# Patient Record
Sex: Female | Born: 1955 | Race: White | Hispanic: No | Marital: Married | Smoking: Former smoker
Health system: Southern US, Community
[De-identification: ages and names within clinical notes are randomized; demographics above are authoritative.]

## PROBLEM LIST (undated history)

## (undated) DIAGNOSIS — G629 Polyneuropathy, unspecified: Secondary | ICD-10-CM

## (undated) DIAGNOSIS — E119 Type 2 diabetes mellitus without complications: Secondary | ICD-10-CM

## (undated) DIAGNOSIS — N189 Chronic kidney disease, unspecified: Secondary | ICD-10-CM

## (undated) DIAGNOSIS — G2581 Restless legs syndrome: Secondary | ICD-10-CM

## (undated) DIAGNOSIS — R0600 Dyspnea, unspecified: Secondary | ICD-10-CM

## (undated) DIAGNOSIS — F419 Anxiety disorder, unspecified: Secondary | ICD-10-CM

## (undated) DIAGNOSIS — G4730 Sleep apnea, unspecified: Secondary | ICD-10-CM

## (undated) DIAGNOSIS — I639 Cerebral infarction, unspecified: Secondary | ICD-10-CM

## (undated) DIAGNOSIS — K219 Gastro-esophageal reflux disease without esophagitis: Secondary | ICD-10-CM

## (undated) DIAGNOSIS — I493 Ventricular premature depolarization: Secondary | ICD-10-CM

## (undated) DIAGNOSIS — Z8489 Family history of other specified conditions: Secondary | ICD-10-CM

## (undated) DIAGNOSIS — C3412 Malignant neoplasm of upper lobe, left bronchus or lung: Secondary | ICD-10-CM

## (undated) DIAGNOSIS — Z8719 Personal history of other diseases of the digestive system: Secondary | ICD-10-CM

## (undated) DIAGNOSIS — I10 Essential (primary) hypertension: Secondary | ICD-10-CM

## (undated) DIAGNOSIS — M1990 Unspecified osteoarthritis, unspecified site: Secondary | ICD-10-CM

## (undated) DIAGNOSIS — M810 Age-related osteoporosis without current pathological fracture: Secondary | ICD-10-CM

## (undated) DIAGNOSIS — D649 Anemia, unspecified: Secondary | ICD-10-CM

## (undated) DIAGNOSIS — F32A Depression, unspecified: Secondary | ICD-10-CM

## (undated) DIAGNOSIS — F329 Major depressive disorder, single episode, unspecified: Secondary | ICD-10-CM

## (undated) DIAGNOSIS — E039 Hypothyroidism, unspecified: Secondary | ICD-10-CM

## (undated) DIAGNOSIS — Z87448 Personal history of other diseases of urinary system: Secondary | ICD-10-CM

## (undated) HISTORY — PX: GALLBLADDER SURGERY: SHX652

## (undated) HISTORY — PX: BUNIONECTOMY: SHX129

## (undated) HISTORY — PX: TONSILLECTOMY: SUR1361

## (undated) HISTORY — DX: Age-related osteoporosis without current pathological fracture: M81.0

## (undated) HISTORY — DX: Type 2 diabetes mellitus without complications: E11.9

## (undated) HISTORY — PX: OTHER SURGICAL HISTORY: SHX169

## (undated) HISTORY — DX: Gastro-esophageal reflux disease without esophagitis: K21.9

## (undated) HISTORY — PX: CHOLECYSTECTOMY: SHX55

## (undated) HISTORY — DX: Personal history of other diseases of urinary system: Z87.448

## (undated) HISTORY — DX: Depression, unspecified: F32.A

## (undated) HISTORY — DX: Sleep apnea, unspecified: G47.30

## (undated) HISTORY — DX: Unspecified osteoarthritis, unspecified site: M19.90

## (undated) HISTORY — DX: Major depressive disorder, single episode, unspecified: F32.9

## (undated) HISTORY — PX: NEUROMA SURGERY: SHX722

---

## 2001-10-26 HISTORY — PX: SPINAL FUSION: SHX223

## 2005-10-26 DIAGNOSIS — I639 Cerebral infarction, unspecified: Secondary | ICD-10-CM

## 2005-10-26 HISTORY — DX: Cerebral infarction, unspecified: I63.9

## 2007-08-16 DIAGNOSIS — K219 Gastro-esophageal reflux disease without esophagitis: Secondary | ICD-10-CM | POA: Insufficient documentation

## 2016-03-27 DIAGNOSIS — E119 Type 2 diabetes mellitus without complications: Secondary | ICD-10-CM | POA: Insufficient documentation

## 2016-12-18 ENCOUNTER — Ambulatory Visit (INDEPENDENT_AMBULATORY_CARE_PROVIDER_SITE_OTHER): Payer: Self-pay | Admitting: Orthopaedic Surgery

## 2016-12-24 ENCOUNTER — Ambulatory Visit (INDEPENDENT_AMBULATORY_CARE_PROVIDER_SITE_OTHER): Payer: BLUE CROSS/BLUE SHIELD | Admitting: Orthopaedic Surgery

## 2017-01-07 ENCOUNTER — Ambulatory Visit (INDEPENDENT_AMBULATORY_CARE_PROVIDER_SITE_OTHER): Payer: Self-pay

## 2017-01-07 ENCOUNTER — Ambulatory Visit (INDEPENDENT_AMBULATORY_CARE_PROVIDER_SITE_OTHER): Payer: BLUE CROSS/BLUE SHIELD | Admitting: Orthopaedic Surgery

## 2017-01-07 ENCOUNTER — Encounter (INDEPENDENT_AMBULATORY_CARE_PROVIDER_SITE_OTHER): Payer: Self-pay | Admitting: Orthopaedic Surgery

## 2017-01-07 VITALS — BP 119/69 | HR 70 | Temp 98.6°F | Ht 62.0 in | Wt 206.0 lb

## 2017-01-07 DIAGNOSIS — M545 Low back pain: Secondary | ICD-10-CM

## 2017-01-07 DIAGNOSIS — G8929 Other chronic pain: Secondary | ICD-10-CM

## 2017-01-07 DIAGNOSIS — M542 Cervicalgia: Secondary | ICD-10-CM

## 2017-01-07 NOTE — Progress Notes (Signed)
Office Visit Note   Patient: Tonya Shelton           Date of Birth: 10-Sep-1956           MRN: [id number] Visit Date: 01/07/2017              Requested by: [name], [place] [address] PCP: [name], FNP   Assessment & Plan: Visit Diagnoses:  1. Neck pain   2. Chronic bilateral low back pain, with sciatica presence unspecified     Plan: Patient has neurogenic claudication symptoms with intact pulses the lower extremity. No evidence of radiculopathy on exam. Previous cervical fusion with degenerative changes lumbar spine consistent with spondylosis. We will proceed with the lumbar MRI since she failed conservative treatment. Thank for the opportunity to share in her care.  Follow-Up Instructions: No Follow-up on file.   Orders:  Orders Placed This Encounter  Procedures  . XR Cervical Spine 2 or 3 views  . XR Lumbar Spine 2-3 Views  . MR Lumbar Spine w/o contrast   No orders of the defined types were placed in this encounter.     Procedures: No procedures performed   Clinical Data: No additional findings.   Subjective: Chief Complaint  Patient presents with  . Neck - Pain  . Middle Back - Pain  . Lower Back - Pain    Patient presents with back pain from her neck down to her low back. She has a history of DDD and bulging discs. She brought her records with her. She had a cervical fusion in 2003.  She takes tizanidine at night, gabapentin '100mg'$  BID, and rarely takes hydrocodone but has a script for it. She has tried heat, ice makes it worse, and rest makes it better.   Patient states she cannot ambulate 100 feet without having to stop and sit down. She's been through multiple chiropractic visits more than 8. This has not improved her pain. She's had previous cervical fusion 2003. She's been on anti-inflammatories, gabapentin, heat. She gets relief with sitting position. Increased pain after standing for more than 5  minutes. She is followed in [place] by Rueben Bash PA. She has problems when she goes in a store since she cannot ambulate very far. Review of Systems 14 point review of systems positive for history of acid reflux arthritis in her knees primarily by her problems depression diabetes appears gallbladder removal hypertension osteoporosis on Fosamax, sleep apnea and thyroid condition. Patient is not working currently. She smoked for 40 years 1 pack per day but doesn't smoke now. She does not drink.  Objective: Vital Signs: BP 119/69   Pulse 70   Temp 98.6 F (37 C)   Ht '5\' 2"'$  (1.575 m)   Wt 206 lb (93.4 kg)   BMI 37.68 kg/m   Physical Exam  Constitutional: She is oriented to person, place, and time. She appears well-developed.  HENT:  Head: Normocephalic.  Right Ear: External ear normal.  Left Ear: External ear normal.  Eyes: Pupils are equal, round, and reactive to light.  Patient was glasses.  Neck: No tracheal deviation present. No thyromegaly present.  Cardiovascular: Normal rate.   Pulmonary/Chest: Effort normal.  Abdominal: Soft.  Musculoskeletal:  Patient gets worsening standing she can ambulate anterior tib EHL is strong reflexes are 2+ and symmetrical. No pain with hip range of motion. She has crepitus with knee extension and patellofemoral loading. Distal pulses are intact. Sensation her foot is intact  no plantar foot lesions.  Neurological: She is alert and oriented to person, place, and time.  Skin: Skin is warm and dry.  Psychiatric: She has a normal mood and affect. Her behavior is normal.    Ortho Exam  Specialty Comments:  No specialty comments available.  Imaging: No results found.   PMFS History: There are no active problems to display for this patient.  Past Medical History:  Diagnosis Date  . Acid reflux   . Arthritis   . Depression   . Diabetes mellitus without complication ([place])   . H/O bladder problems   . Osteoporosis   . Sleep apnea      No family history on file.  Past Surgical History:  Procedure Laterality Date  . BUNIONECTOMY    . GALLBLADDER SURGERY    . NEUROMA SURGERY    . SPINAL FUSION  2003   cervical  . tonsillectomy     Social History   Occupational History  . Not on file.   Social History Main Topics  . Smoking status: Former Research scientist (life sciences)  . Smokeless tobacco: Never Used  . Alcohol use No  . Drug use: No  . Sexual activity: Not on file

## 2017-01-12 ENCOUNTER — Ambulatory Visit (INDEPENDENT_AMBULATORY_CARE_PROVIDER_SITE_OTHER): Payer: Self-pay | Admitting: Orthopaedic Surgery

## 2017-01-14 ENCOUNTER — Ambulatory Visit (INDEPENDENT_AMBULATORY_CARE_PROVIDER_SITE_OTHER): Payer: BLUE CROSS/BLUE SHIELD | Admitting: Orthopaedic Surgery

## 2017-01-14 ENCOUNTER — Encounter (INDEPENDENT_AMBULATORY_CARE_PROVIDER_SITE_OTHER): Payer: Self-pay | Admitting: Orthopaedic Surgery

## 2017-01-14 VITALS — BP 137/64 | HR 59 | Ht 62.0 in | Wt 206.0 lb

## 2017-01-14 DIAGNOSIS — M5116 Intervertebral disc disorders with radiculopathy, lumbar region: Secondary | ICD-10-CM | POA: Diagnosis not present

## 2017-01-14 NOTE — Progress Notes (Signed)
Office Visit Note   Patient: Tonya Shelton           Date of Birth: 31-Jul-1956           MRN: [id number] Visit Date: 01/14/2017              Requested by: [name], [place] [address] PCP: [name], FNP   Assessment & Plan: Visit Diagnoses:  1. Lumbar disc herniation with radiculopathy     Plan: shortly she is to be on [employer] she'd she now is on a new insurance. She hadn't had an injection since last year. I scan is reviewed with her I gave her a copy of the report. This shows disc herniation with compression at L5 S1 on the left with mass effect on the left S1 nerve root. Minimal bulges at other levels. She has good hydration at all disc levels all the way down to L3-4 disc. Minimal dehydration at L4-5 and desiccation at L5-S1 more pronounced. She's had use a cane to avoid falling. She still has some weakness in her legs when she gets up and walks she develops claudication symptoms that's a stop and sit. We'll see her back after the epidural injection. We reviewed the MRI scan and I gave her copy of the report.had a total over the last several years of 8-10 epidurals without sustained relief.  Follow-Up Instructions: Return in about 4 weeks (around 02/11/2017).   Orders:  No orders of the defined types were placed in this encounter.  No orders of the defined types were placed in this encounter.     Procedures: No procedures performed   Clinical Data: No additional findings.   Subjective: Chief Complaint  Patient presents with  . Lower Back - Pain    Patient returns to review MRI Lumbar Spine. She states that there have been no changes.     Review of SystemsUse systems updated from 01/07/2017 office visit 1 week ago. There is no change.    Objective: Vital Signs: BP 137/64   Pulse (!) 59   Ht '5\' 2"'$  (1.575 m)   Wt 206 lb (93.4 kg)   BMI 37.68 kg/m   Physical Exam  Constitutional: She is oriented to  person, place, and time. She appears well-developed.  HENT:  Head: Normocephalic.  Right Ear: External ear normal.  Left Ear: External ear normal.  Eyes: Pupils are equal, round, and reactive to light.  Neck: No tracheal deviation present. No thyromegaly present.  Cardiovascular: Normal rate.   Pulmonary/Chest: Effort normal.  Abdominal: Soft.  Neurological: She is alert and oriented to person, place, and time.  Skin: Skin is warm and dry.  Psychiatric: She has a normal mood and affect. Her behavior is normal.  Patient has a strong anterior tib EHL. Some patellofemoral crepitus with knee extension. Cessation her foot is intact no plantar foot lesions. No pitting edema. No pain with hip range of motion. Patient's alert and oriented.   Ortho Exam  Specialty Comments:  No specialty comments available.  Imaging: No results found.   PMFS History: There are no active problems to display for this patient.  Past Medical History:  Diagnosis Date  . Acid reflux   . Arthritis   . Depression   . Diabetes mellitus without complication ([place])   . H/O bladder problems   . Osteoporosis   . Sleep apnea     No family history on file.  Past Surgical History:  Procedure Laterality Date  . BUNIONECTOMY    . GALLBLADDER SURGERY    . NEUROMA SURGERY    . SPINAL FUSION  2003   cervical  . tonsillectomy     Social History   Occupational History  . Not on file.   Social History Main Topics  . Smoking status: Former Research scientist (life sciences)  . Smokeless tobacco: Never Used  . Alcohol use No  . Drug use: No  . Sexual activity: Not on file

## 2017-01-14 NOTE — Addendum Note (Signed)
Addended by: Meyer Cory on: 01/14/2017 04:34 PM   Modules accepted: Orders

## 2017-01-20 ENCOUNTER — Telehealth (INDEPENDENT_AMBULATORY_CARE_PROVIDER_SITE_OTHER): Payer: Self-pay | Admitting: Physical Medicine and Rehabilitation

## 2017-01-20 MED ORDER — DIAZEPAM 5 MG PO TABS: ORAL_TABLET | ORAL | 0 refills | Status: DC

## 2017-01-20 NOTE — Addendum Note (Signed)
Addended by: Meyer Cory on: 01/20/2017 04:43 PM   Modules accepted: Orders

## 2017-01-20 NOTE — Telephone Encounter (Signed)
Script entered into system. Patient is from [place], [place] and asked for it to be called in. Called to [facility] in [place].

## 2017-01-20 NOTE — Telephone Encounter (Signed)
Patient called wanting to know if she can still get something to relax her before she receives the injection on April 4th.  CB#[phone].  Thank you.

## 2017-01-20 NOTE — Telephone Encounter (Signed)
Please advise 

## 2017-01-20 NOTE — Telephone Encounter (Signed)
OK for rx for valium '5mg'$    # 2 .    Print and I can sign thanks.

## 2017-01-20 NOTE — Telephone Encounter (Signed)
This is [facility] patient

## 2017-01-27 ENCOUNTER — Encounter (INDEPENDENT_AMBULATORY_CARE_PROVIDER_SITE_OTHER): Payer: Self-pay | Admitting: Physical Medicine and Rehabilitation

## 2017-01-27 ENCOUNTER — Ambulatory Visit (INDEPENDENT_AMBULATORY_CARE_PROVIDER_SITE_OTHER): Payer: BLUE CROSS/BLUE SHIELD | Admitting: Physical Medicine and Rehabilitation

## 2017-01-27 ENCOUNTER — Ambulatory Visit (INDEPENDENT_AMBULATORY_CARE_PROVIDER_SITE_OTHER): Payer: BLUE CROSS/BLUE SHIELD

## 2017-01-27 VITALS — BP 145/67 | HR 75 | Temp 98.0°F

## 2017-01-27 DIAGNOSIS — M5416 Radiculopathy, lumbar region: Secondary | ICD-10-CM

## 2017-01-27 MED ORDER — METHYLPREDNISOLONE ACETATE 80 MG/ML IJ SUSP
80.0000 mg | Freq: Once | INTRAMUSCULAR | Status: AC
  Administered 2017-01-27: 80 mg

## 2017-01-27 MED ORDER — LIDOCAINE HCL (PF) 1 % IJ SOLN
0.3300 mL | Freq: Once | INTRAMUSCULAR | Status: AC
  Administered 2017-01-27: 0.3 mL

## 2017-01-27 NOTE — Patient Instructions (Signed)

## 2017-01-27 NOTE — Progress Notes (Signed)
Tonya Shelton - 61 y.o. female MRN [id number]  Date of birth: 1956-07-01  Office Visit Note: Visit Date: 01/27/2017 PCP: [name], FNP Referred by: [name], F*  Subjective: Chief Complaint  Patient presents with  . Lower Back - Pain   HPI: Tonya Shelton is a 62 year old female followed by Dr. Lorin Mercy who complains of chronic worsening severe pain in the center of her lower back and radiates to the left side and down leg to just past knee. Occasionally will radiate all the way to foot. Denies numbness and tingling. She has MRI evidence of paracentral disc herniation at L5-S1 on the left. We are going to complete a diagnostic and therapeutic left L5-S1 interlaminar epidural injection. Depending on the relief would suggest S1 transforaminal injection.    ROS Otherwise per HPI.  Assessment & Plan: Visit Diagnoses:  1. Lumbar radiculopathy     Plan: Findings:  Left L5-S1 interlaminar epidural steroid injection.    Meds & Orders:  Meds ordered this encounter  Medications  . lidocaine (PF) (XYLOCAINE) 1 % injection 0.3 mL  . methylPREDNISolone acetate (DEPO-MEDROL) injection 80 mg    Orders Placed This Encounter  Procedures  . XR C-ARM NO REPORT  . Epidural Steroid injection    Follow-up: Return for Schedule follow up with Dr. Lorin Mercy, 2 to 3 weeks.   Procedures: No procedures performed  Lumbar Epidural Steroid Injection - Interlaminar Approach with Fluoroscopic Guidance  Patient: Tonya Shelton      Date of Birth: [birth year] MRN: [id number] PCP: [name], FNP      Visit Date: 01/27/2017   Universal Protocol:    Date/Time: 04/06/185:43 AM  Consent Given By: the patient  Position: PRONE  Additional Comments: Vital signs were monitored before and after the procedure. Patient was prepped and draped in the usual sterile fashion. The correct patient, procedure, and site was verified.   Injection Procedure Details:  Procedure Site  One Meds Administered:  Meds ordered this encounter  Medications  . lidocaine (PF) (XYLOCAINE) 1 % injection 0.3 mL  . methylPREDNISolone acetate (DEPO-MEDROL) injection 80 mg     Laterality: Left  Location/Site:  L5-S1  Needle size: 20 G  Needle type: Tuohy  Needle Placement: Paramedian epidural  Findings:  -Contrast Used: 1 mL iohexol 180 mg iodine/mL   -Comments: Excellent flow of contrast into the epidural space.  Procedure Details: Using a paramedian approach from the side mentioned above, the region overlying the inferior lamina was localized under fluoroscopic visualization and the soft tissues overlying this structure were infiltrated with 4 ml. of 1% Lidocaine without Epinephrine. The Tuohy needle was inserted into the epidural space using a paramedian approach.   The epidural space was localized using loss of resistance along with lateral and bi-planar fluoroscopic views.  After negative aspirate for air, blood, and CSF, a 2 ml. volume of Isovue-250 was injected into the epidural space and the flow of contrast was observed. Radiographs were obtained for documentation purposes.    The injectate was administered into the level noted above.   Additional Comments:  The patient tolerated the procedure well Dressing: Band-Aid    Post-procedure details: Patient was observed during the procedure. Post-procedure instructions were reviewed.  Patient left the clinic in stable condition.    Clinical History: Lumbar spine MRI 12/2016  This shows disc herniation with compression at L5-S1 on the left with mass effect on the left S1 nerve root. Minimal bulges at other levels. She has good hydration  at all disc levels all the way down to L3-4 disc. Minimal dehydration at L4-5 and desiccation at L5-S1 more pronounced.  She reports that she has quit smoking. She has never used smokeless tobacco. No results for input(s): HGBA1C, LABURIC in the last 8760 hours.  Objective:  VS:   HT:    WT:   BMI:     BP:(!) 145/67  HR:75bpm  TEMP:98 F (36.7 C)( )  RESP:96 % Physical Exam  Musculoskeletal:  Patient relates without aid with good distal strength.    Ortho Exam Imaging: No results found.  Past Medical/Family/Surgical/Social History: Medications & Allergies reviewed per EMR There are no active problems to display for this patient.  Past Medical History:  Diagnosis Date  . Acid reflux   . Arthritis   . Depression   . Diabetes mellitus without complication ([place])   . H/O bladder problems   . Osteoporosis   . Sleep apnea    History reviewed. No pertinent family history. Past Surgical History:  Procedure Laterality Date  . BUNIONECTOMY    . GALLBLADDER SURGERY    . NEUROMA SURGERY    . SPINAL FUSION  2003   cervical  . tonsillectomy     Social History   Occupational History  . Not on file.   Social History Main Topics  . Smoking status: Former Research scientist (life sciences)  . Smokeless tobacco: Never Used  . Alcohol use No  . Drug use: No  . Sexual activity: Not on file

## 2017-01-29 NOTE — Procedures (Signed)
Lumbar Epidural Steroid Injection - Interlaminar Approach with Fluoroscopic Guidance  Patient: Tonya Shelton      Date of Birth: 04/05/1956 MRN: [id number] PCP: [name], FNP      Visit Date: 01/27/2017   Universal Protocol:    Date/Time: 04/06/185:43 AM  Consent Given By: the patient  Position: PRONE  Additional Comments: Vital signs were monitored before and after the procedure. Patient was prepped and draped in the usual sterile fashion. The correct patient, procedure, and site was verified.   Injection Procedure Details:  Procedure Site One Meds Administered:  Meds ordered this encounter  Medications  . lidocaine (PF) (XYLOCAINE) 1 % injection 0.3 mL  . methylPREDNISolone acetate (DEPO-MEDROL) injection 80 mg     Laterality: Left  Location/Site:  L5-S1  Needle size: 20 G  Needle type: Tuohy  Needle Placement: Paramedian epidural  Findings:  -Contrast Used: 1 mL iohexol 180 mg iodine/mL   -Comments: Excellent flow of contrast into the epidural space.  Procedure Details: Using a paramedian approach from the side mentioned above, the region overlying the inferior lamina was localized under fluoroscopic visualization and the soft tissues overlying this structure were infiltrated with 4 ml. of 1% Lidocaine without Epinephrine. The Tuohy needle was inserted into the epidural space using a paramedian approach.   The epidural space was localized using loss of resistance along with lateral and bi-planar fluoroscopic views.  After negative aspirate for air, blood, and CSF, a 2 ml. volume of Isovue-250 was injected into the epidural space and the flow of contrast was observed. Radiographs were obtained for documentation purposes.    The injectate was administered into the level noted above.   Additional Comments:  The patient tolerated the procedure well Dressing: Band-Aid    Post-procedure details: Patient was observed during the  procedure. Post-procedure instructions were reviewed.  Patient left the clinic in stable condition.

## 2017-02-11 ENCOUNTER — Ambulatory Visit (INDEPENDENT_AMBULATORY_CARE_PROVIDER_SITE_OTHER): Payer: BLUE CROSS/BLUE SHIELD | Admitting: Orthopaedic Surgery

## 2017-02-18 ENCOUNTER — Encounter (INDEPENDENT_AMBULATORY_CARE_PROVIDER_SITE_OTHER): Payer: Self-pay | Admitting: Orthopaedic Surgery

## 2017-02-18 ENCOUNTER — Ambulatory Visit (INDEPENDENT_AMBULATORY_CARE_PROVIDER_SITE_OTHER): Payer: BLUE CROSS/BLUE SHIELD | Admitting: Orthopaedic Surgery

## 2017-02-18 VITALS — BP 106/56 | HR 69 | Ht 62.0 in | Wt 198.0 lb

## 2017-02-18 DIAGNOSIS — M5126 Other intervertebral disc displacement, lumbar region: Secondary | ICD-10-CM | POA: Diagnosis not present

## 2017-02-18 NOTE — Progress Notes (Signed)
Office Visit Note   Patient: Tonya Shelton           Date of Birth: 11-15-1955           MRN: [id number] Visit Date: 02/18/2017              Requested by: [name], [place] [address] PCP: [name], FNP   Assessment & Plan: Visit Diagnoses:  1. Protrusion of lumbar intervertebral disc     Plan: Patient will continue to work on weight loss. We reviewed the MRI scan again. She will try to increase her ambulation distance. She does not have significant stenosis on her MRI scan and we will recheck her again in 2 months. If she develops increasing radicular symptoms she will call and let us know.  Follow-Up Instructions: Return in about 2 months (around 04/20/2017).   Orders:  No orders of the defined types were placed in this encounter.  No orders of the defined types were placed in this encounter.     Procedures: No procedures performed   Clinical Data: No additional findings.   Subjective: Chief Complaint  Patient presents with  . Lower Back - Pain    HPI she returned she still having back and left leg symptoms but states she is about 50% better after the injection. Recently was placed on some iron for anemia and also has been taking some Lasix. We reviewed the MRI scan again today which showed the disc protrusion on the left at L5-S1 with some lateral recess stenosis and foraminal bulge of the disc only in the early portion of the foramina. We discussed operative intervention but since she is gotten 50% pain relief we'll continue nonoperative treatment at this point. Recently Lasix and ferrous sulfate has been added to her medication list. She has been through anti-inflammatories, gabapentin, chiropractic treatment. Pain with ambulation 100 feet or standing more than 10 minutes. Symptoms have progressed over the last year. Review of Systems 14 point review of systems is updated and is unchanged from 01/07/2017 other than as  mentioned in history of present illness.   Objective: Vital Signs: BP (!) 106/56   Pulse 69   Ht '5\' 2"'$  (1.575 m)   Wt 198 lb (89.8 kg)   BMI 36.21 kg/m   Physical Exam  Constitutional: She is oriented to person, place, and time. She appears well-developed.  HENT:  Head: Normocephalic.  Right Ear: External ear normal.  Left Ear: External ear normal.  Eyes: Pupils are equal, round, and reactive to light.  Neck: No tracheal deviation present. No thyromegaly present.  Cardiovascular: Normal rate.   Pulmonary/Chest: Effort normal.  Abdominal: Soft.  Musculoskeletal:  Pelvis is level mild tenderness over the sciatic notch. Reflexes are symmetrical. Distal pulses are intact. Quadriceps hamstrings anterior tib EHL gastrocsoleus are strong. No dorsal foot numbness. Negative [place]oman.  Neurological: She is alert and oriented to person, place, and time.  Skin: Skin is warm and dry.  Psychiatric: She has a normal mood and affect. Her behavior is normal.    Ortho Exam  Specialty Comments:  No specialty comments available.  Imaging:  MRI 01/13/2017  PMFS History: There are no active problems to display for this patient.  Past Medical History:  Diagnosis Date  . Acid reflux   . Arthritis   . Depression   . Diabetes mellitus without complication ([place])   . H/O bladder problems   . Osteoporosis   . Sleep apnea  No family history on file.  Past Surgical History:  Procedure Laterality Date  . BUNIONECTOMY    . GALLBLADDER SURGERY    . NEUROMA SURGERY    . SPINAL FUSION  2003   cervical  . tonsillectomy     Social History   Occupational History  . Not on file.   Social History Main Topics  . Smoking status: Former Research scientist (life sciences)  . Smokeless tobacco: Never Used  . Alcohol use No  . Drug use: No  . Sexual activity: Not on file

## 2017-04-15 ENCOUNTER — Encounter (INDEPENDENT_AMBULATORY_CARE_PROVIDER_SITE_OTHER): Payer: Self-pay | Admitting: Orthopaedic Surgery

## 2017-04-15 ENCOUNTER — Ambulatory Visit (INDEPENDENT_AMBULATORY_CARE_PROVIDER_SITE_OTHER): Payer: BLUE CROSS/BLUE SHIELD | Admitting: Orthopaedic Surgery

## 2017-04-15 VITALS — BP 97/55 | HR 81 | Ht 62.0 in | Wt 200.0 lb

## 2017-04-15 DIAGNOSIS — M5126 Other intervertebral disc displacement, lumbar region: Secondary | ICD-10-CM

## 2017-04-15 MED ORDER — ACETAMINOPHEN-CODEINE #3 300-30 MG PO TABS: 1.0000 | ORAL_TABLET | Freq: Two times a day (BID) | ORAL | 0 refills | Status: DC | PRN

## 2017-04-15 NOTE — Progress Notes (Signed)
Office Visit Note   Patient: Tonya Shelton           Date of Birth: 08-10-1956           MRN: [id number] Visit Date: 04/15/2017              Requested by: [name], [place] [address] PCP: [name], FNP   Assessment & Plan: Visit Diagnoses:  1. Protrusion of lumbar intervertebral disc     Plan: She previously got fairly good relief for several weeks and epidural. She is a disc protrusion on the left at L5-S1 from previous MRI March 2018. Other levels did not show compression and her area of narrowing was early in the foramina and lateral recess. Will set up for an epidural see her back after the injection.  Follow-Up Instructions: No Follow-up on file.   Orders:  No orders of the defined types were placed in this encounter.  No orders of the defined types were placed in this encounter.     Procedures: No procedures performed   Clinical Data: No additional findings.   Subjective: Chief Complaint  Patient presents with  . Lower Back - Pain, Follow-up    HPI patient returns and she states her back pain is now increased and his severe pain the radius down his left leg just past the knee sometimes down to the ankle and then stops. She can't sit she has her walking has difficulty getting up sometimes has trouble getting back down into a chair. She is not able be active and has not been able to lose any weight. MRI scan 01/13/2017 showed small focal left paracentral disc protrusion L5-S1 with mass effect on the left S1 nerve root. Other levels were normal. She has increased pain with standing for 10 minutes and ambulating more than 100 feet.  Review of Systems 14 point review systems is updated and unchanged from 01/07/2017. Previous epidural gave her 50% pain relief for many weeks. She has stage III renal disease cannot take anti-inflammatories.   Objective: Vital Signs: BP (!) 97/55   Pulse 81   Ht 5\' 2"  (1.575 m)   Wt  200 lb (90.7 kg)   BMI 36.58 kg/m   Physical Exam  Constitutional: She is oriented to person, place, and time. She appears well-developed.  HENT:  Head: Normocephalic.  Right Ear: External ear normal.  Left Ear: External ear normal.  Eyes: Pupils are equal, round, and reactive to light.  Neck: No tracheal deviation present. No thyromegaly present.  Cardiovascular: Normal rate.   Pulmonary/Chest: Effort normal.  Abdominal: Soft.  Truncal obesity  Musculoskeletal:  She has some pain with straight leg raising positive sinus tenderness on the left some trochanteric bursal tenderness no pain with hip range of motion negative Corky Sox. Reflexes intact no thigh atrophy rather left. No pitting edema. Chest pain with straight leg raising popped a compression on the left only. Good knee range of motion with stability.  Neurological: She is alert and oriented to person, place, and time.  Skin: Skin is warm and dry.  Psychiatric: She has a normal mood and affect. Her behavior is normal.    Ortho Exam  Specialty Comments:  No specialty comments available.  Imaging: No results found.   PMFS History: There are no active problems to display for this patient.  Past Medical History:  Diagnosis Date  . Acid reflux   . Arthritis   . Depression   . Diabetes  mellitus without complication ([place])   . H/O bladder problems   . Osteoporosis   . Sleep apnea     No family history on file.  Past Surgical History:  Procedure Laterality Date  . BUNIONECTOMY    . GALLBLADDER SURGERY    . NEUROMA SURGERY    . SPINAL FUSION  2003   cervical  . tonsillectomy     Social History   Occupational History  . Not on file.   Social History Main Topics  . Smoking status: Former Research scientist (life sciences)  . Smokeless tobacco: Never Used  . Alcohol use No  . Drug use: No  . Sexual activity: Not on file

## 2017-05-03 ENCOUNTER — Encounter (INDEPENDENT_AMBULATORY_CARE_PROVIDER_SITE_OTHER): Payer: Self-pay | Admitting: Physical Medicine and Rehabilitation

## 2017-05-03 ENCOUNTER — Ambulatory Visit (INDEPENDENT_AMBULATORY_CARE_PROVIDER_SITE_OTHER): Payer: Managed Care, Other (non HMO) | Admitting: Physical Medicine and Rehabilitation

## 2017-05-03 ENCOUNTER — Ambulatory Visit (INDEPENDENT_AMBULATORY_CARE_PROVIDER_SITE_OTHER): Payer: Managed Care, Other (non HMO)

## 2017-05-03 VITALS — BP 121/55 | HR 64

## 2017-05-03 DIAGNOSIS — M5416 Radiculopathy, lumbar region: Secondary | ICD-10-CM | POA: Diagnosis not present

## 2017-05-03 DIAGNOSIS — M5116 Intervertebral disc disorders with radiculopathy, lumbar region: Secondary | ICD-10-CM

## 2017-05-03 MED ORDER — METHYLPREDNISOLONE ACETATE 80 MG/ML IJ SUSP
80.0000 mg | Freq: Once | INTRAMUSCULAR | Status: AC
  Administered 2017-05-03: 80 mg

## 2017-05-03 MED ORDER — LIDOCAINE HCL (PF) 1 % IJ SOLN
2.0000 mL | Freq: Once | INTRAMUSCULAR | Status: AC
  Administered 2017-05-03: 2 mL

## 2017-05-03 NOTE — Progress Notes (Deleted)
Left side low back pain and radiates down back of left leg to calf. Occasionally radiates to foot.  Constant pain but pain level varies. Difficulty picking leg up to get in and out of car. Denies numbness or tingling.

## 2017-05-03 NOTE — Patient Instructions (Signed)

## 2017-05-04 NOTE — Procedures (Signed)
Lumbar Epidural Steroid Injection - Interlaminar Approach with Fluoroscopic Guidance  Patient: Tonya Shelton      Date of Birth: 1956/04/30 MRN: [id number] PCP: [name], FNP      Visit Date: 05/03/2017   Tonya Shelton is a 61 year old female followed by Dr. Lorin Mercy. She has known disc herniation at L5-S1 which is left paracentral protrusion. She is having left-sided buttock and hamstring pain to the calf. No real paresthesias. She has difficulty getting out of a car and walking. She cannot quite a bit relief with prior epidural injection at distant last very long. That injection was done in April. We are repeat that today. This was from an intralaminar approach. Depending on relief would at least try an S1 transforaminal epidural steroid injection. Dr. Lorin Mercy will see her in follow-up.  Universal Protocol:    Date/Time: 07/10/186:18 AM  Consent Given By: the patient  Position: PRONE  Additional Comments: Vital signs were monitored before and after the procedure. Patient was prepped and draped in the usual sterile fashion. The correct patient, procedure, and site was verified.   Injection Procedure Details:  Procedure Site One Meds Administered:  Meds ordered this encounter  Medications  . lidocaine (PF) (XYLOCAINE) 1 % injection 2 mL  . methylPREDNISolone acetate (DEPO-MEDROL) injection 80 mg     Laterality: Left  Location/Site:  L5-S1  Needle size: 20 G  Needle type: Tuohy  Needle Placement: Paramedian epidural  Findings:  -Contrast Used: 1 mL iohexol 180 mg iodine/mL   -Comments: Excellent flow of contrast into the epidural space.  Procedure Details: Using a paramedian approach from the side mentioned above, the region overlying the inferior lamina was localized under fluoroscopic visualization and the soft tissues overlying this structure were infiltrated with 4 ml. of 1% Lidocaine without Epinephrine. The Tuohy needle was inserted into the epidural space  using a paramedian approach.   The epidural space was localized using loss of resistance along with lateral and bi-planar fluoroscopic views.  After negative aspirate for air, blood, and CSF, a 2 ml. volume of Isovue-250 was injected into the epidural space and the flow of contrast was observed. Radiographs were obtained for documentation purposes.    The injectate was administered into the level noted above.   Additional Comments:  The patient tolerated the procedure well Dressing: Band-Aid    Post-procedure details: Patient was observed during the procedure. Post-procedure instructions were reviewed.  Patient left the clinic in stable condition.

## 2017-05-20 ENCOUNTER — Encounter (INDEPENDENT_AMBULATORY_CARE_PROVIDER_SITE_OTHER): Payer: Self-pay | Admitting: Orthopaedic Surgery

## 2017-05-20 ENCOUNTER — Ambulatory Visit (INDEPENDENT_AMBULATORY_CARE_PROVIDER_SITE_OTHER): Payer: Managed Care, Other (non HMO) | Admitting: Orthopaedic Surgery

## 2017-05-20 VITALS — BP 121/59 | HR 66 | Ht 62.0 in | Wt 202.0 lb

## 2017-05-20 DIAGNOSIS — M5126 Other intervertebral disc displacement, lumbar region: Secondary | ICD-10-CM | POA: Diagnosis not present

## 2017-05-20 NOTE — Progress Notes (Signed)
Office Visit Note   Patient: Tonya Shelton           Date of Birth: June 03, 1956           MRN: [id number] Visit Date: 05/20/2017              Requested by: [name], [place] [address] PCP: [name], FNP   Assessment & Plan: Visit Diagnoses:  1. Protrusion of lumbar intervertebral disc       Left L5-S1 disc protrusion  Plan: Left L5-S1 microdiscectomy. Procedure discussed, overnight stay in the hospital. We discussed the operative technique overnight stay, walking program afterwards. She likely continue using the cane for. Time. She understands that there is a risk of disc recurrence herniation which is 5-10%. Potential for degeneration or progression at other levels that did not have surgery which currently look okay. Questions were elicited and answered she understands would like to proceed.  Follow-Up Instructions: No Follow-up on file.   Orders:  No orders of the defined types were placed in this encounter.  No orders of the defined types were placed in this encounter.     Procedures: No procedures performed   Clinical Data: No additional findings.   Subjective: Chief Complaint  Patient presents with  . Lower Back - Pain, Follow-up    HPI 61 year old female returns she had a last epidural has had multiple epidurals over the last several years. She's not gotten any relief from the epidural and MRI scan from March of this year showed disc protrusion with nerve root compression on the left side. She's having to use a cane since her leg feels weak when she walks. Her balance hasn't been really good. She denies any right leg symptoms. She's taken anti-inflammatories had pain medication in the past been through and also formal physical therapy without relief. States her pain is so severe radiates from her back and her left posterior thigh and into the lateral calf for it stops.  Review of Systems 14 point review of systems  positive for knee arthritis, problems with depression, diabetes. Previous gallbladder removal, hypertension. Osteoporosis on Fosamax. Positive for thyroid condition, sleep apnea, back pain severe times several years with left leg pain. She was a past smoker.  Objective: Vital Signs: BP (!) 121/59   Pulse 66   Ht 5\' 2"  (1.575 m)   Wt 202 lb (91.6 kg)   BMI 36.95 kg/m   Physical Exam  Constitutional: She is oriented to person, place, and time. She appears well-developed.  HENT:  Head: Normocephalic.  Right Ear: External ear normal.  Left Ear: External ear normal.  Eyes: Pupils are equal, round, and reactive to light.  Neck: No tracheal deviation present. No thyromegaly present.  Cardiovascular: Normal rate.   Pulmonary/Chest: Effort normal.  Abdominal: Soft.  Musculoskeletal:  Patient's a mature with a cane. She has positive straight leg raising at 70 on the left positive popped a compression test. Trace ankle jerk on the left 2+ on the right 2+ knee jerk. No pain with hip range of motion. Distal pulses are palpable.  Neurological: She is alert and oriented to person, place, and time.  Skin: Skin is warm and dry.  Psychiatric: She has a normal mood and affect. Her behavior is normal.    Ortho Exam normal hip range of motion negative Fabere. No scoliosis. Pelvis is level.  Specialty Comments:  No specialty comments available.  Imaging: Lumbar MRI scan 01/13/2017 shows paracentral disc  protrusion at L5-S1 with mass effect on the left S1 nerve root. Mild bulge at L1-2 and also L4-5 without compression. Moderate facet disease at L5-S1. No  central stenosisat L5-S1.   PMFS History: Patient Active Problem List   Diagnosis Date Noted  . Protrusion of lumbar intervertebral disc 05/20/2017   Past Medical History:  Diagnosis Date  . Acid reflux   . Arthritis   . Depression   . Diabetes mellitus without complication ([place])   . H/O bladder problems   . Osteoporosis   . Sleep apnea      No family history on file.  Past Surgical History:  Procedure Laterality Date  . BUNIONECTOMY    . GALLBLADDER SURGERY    . NEUROMA SURGERY    . SPINAL FUSION  2003   cervical  . tonsillectomy     Social History   Occupational History  . Not on file.   Social History Main Topics  . Smoking status: Former Research scientist (life sciences)  . Smokeless tobacco: Never Used  . Alcohol use No  . Drug use: No  . Sexual activity: Not on file

## 2017-06-17 ENCOUNTER — Ambulatory Visit (INDEPENDENT_AMBULATORY_CARE_PROVIDER_SITE_OTHER): Payer: Managed Care, Other (non HMO) | Admitting: Orthopaedic Surgery

## 2017-10-13 ENCOUNTER — Encounter (HOSPITAL_COMMUNITY)
Admission: RE | Admit: 2017-10-13 | Discharge: 2017-10-13 | Disposition: A | Discharge status: Home / Self Care | Payer: BLUE CROSS/BLUE SHIELD | Source: Ambulatory Visit | Attending: Orthopaedic Surgery | Admitting: Orthopaedic Surgery

## 2017-10-13 ENCOUNTER — Encounter (HOSPITAL_COMMUNITY): Payer: Self-pay

## 2017-10-13 ENCOUNTER — Other Ambulatory Visit (HOSPITAL_COMMUNITY): Payer: Self-pay | Admitting: *Deleted

## 2017-10-13 ENCOUNTER — Other Ambulatory Visit: Payer: Self-pay

## 2017-10-13 DIAGNOSIS — M48061 Spinal stenosis, lumbar region without neurogenic claudication: Secondary | ICD-10-CM | POA: Diagnosis not present

## 2017-10-13 DIAGNOSIS — Z7984 Long term (current) use of oral hypoglycemic drugs: Secondary | ICD-10-CM | POA: Diagnosis not present

## 2017-10-13 DIAGNOSIS — G2581 Restless legs syndrome: Secondary | ICD-10-CM | POA: Diagnosis not present

## 2017-10-13 DIAGNOSIS — I129 Hypertensive chronic kidney disease with stage 1 through stage 4 chronic kidney disease, or unspecified chronic kidney disease: Secondary | ICD-10-CM | POA: Diagnosis not present

## 2017-10-13 DIAGNOSIS — Z881 Allergy status to other antibiotic agents status: Secondary | ICD-10-CM | POA: Diagnosis not present

## 2017-10-13 DIAGNOSIS — N183 Chronic kidney disease, stage 3 (moderate): Secondary | ICD-10-CM | POA: Diagnosis not present

## 2017-10-13 DIAGNOSIS — M810 Age-related osteoporosis without current pathological fracture: Secondary | ICD-10-CM | POA: Diagnosis not present

## 2017-10-13 DIAGNOSIS — E669 Obesity, unspecified: Secondary | ICD-10-CM | POA: Diagnosis not present

## 2017-10-13 DIAGNOSIS — Z79899 Other long term (current) drug therapy: Secondary | ICD-10-CM | POA: Diagnosis not present

## 2017-10-13 DIAGNOSIS — E1122 Type 2 diabetes mellitus with diabetic chronic kidney disease: Secondary | ICD-10-CM | POA: Diagnosis not present

## 2017-10-13 DIAGNOSIS — K219 Gastro-esophageal reflux disease without esophagitis: Secondary | ICD-10-CM | POA: Diagnosis not present

## 2017-10-13 DIAGNOSIS — E039 Hypothyroidism, unspecified: Secondary | ICD-10-CM | POA: Diagnosis not present

## 2017-10-13 DIAGNOSIS — G4730 Sleep apnea, unspecified: Secondary | ICD-10-CM | POA: Diagnosis not present

## 2017-10-13 DIAGNOSIS — E1140 Type 2 diabetes mellitus with diabetic neuropathy, unspecified: Secondary | ICD-10-CM | POA: Diagnosis not present

## 2017-10-13 DIAGNOSIS — Z6837 Body mass index (BMI) 37.0-37.9, adult: Secondary | ICD-10-CM | POA: Diagnosis not present

## 2017-10-13 DIAGNOSIS — F329 Major depressive disorder, single episode, unspecified: Secondary | ICD-10-CM | POA: Diagnosis not present

## 2017-10-13 DIAGNOSIS — M5116 Intervertebral disc disorders with radiculopathy, lumbar region: Secondary | ICD-10-CM | POA: Diagnosis not present

## 2017-10-13 DIAGNOSIS — Z8673 Personal history of transient ischemic attack (TIA), and cerebral infarction without residual deficits: Secondary | ICD-10-CM | POA: Diagnosis not present

## 2017-10-13 HISTORY — DX: Essential (primary) hypertension: I10

## 2017-10-13 HISTORY — DX: Cerebral infarction, unspecified: I63.9

## 2017-10-13 HISTORY — DX: Hypothyroidism, unspecified: E03.9

## 2017-10-13 HISTORY — DX: Anemia, unspecified: D64.9

## 2017-10-13 HISTORY — DX: Family history of other specified conditions: Z84.89

## 2017-10-13 HISTORY — DX: Chronic kidney disease, unspecified: N18.9

## 2017-10-13 HISTORY — DX: Personal history of other diseases of the digestive system: Z87.19

## 2017-10-13 HISTORY — DX: Ventricular premature depolarization: I49.3

## 2017-10-13 HISTORY — DX: Restless legs syndrome: G25.81

## 2017-10-13 HISTORY — DX: Polyneuropathy, unspecified: G62.9

## 2017-10-13 LAB — SURGICAL PCR SCREEN
MRSA, PCR: NEGATIVE
Staphylococcus aureus: NEGATIVE

## 2017-10-13 LAB — CBC
HCT: 38.5 % (ref 36.0–46.0)
Hemoglobin: 12.6 g/dL (ref 12.0–15.0)
MCH: 31.4 pg (ref 26.0–34.0)
MCHC: 32.7 g/dL (ref 30.0–36.0)
MCV: 96 fL (ref 78.0–100.0)
PLATELETS: 239 10*3/uL (ref 150–400)
RBC: 4.01 MIL/uL (ref 3.87–5.11)
RDW: 13.8 % (ref 11.5–15.5)
WBC: 10.4 10*3/uL (ref 4.0–10.5)

## 2017-10-13 LAB — BASIC METABOLIC PANEL
Anion gap: 8 (ref 5–15)
BUN: 13 mg/dL (ref 6–20)
CALCIUM: 8.9 mg/dL (ref 8.9–10.3)
CO2: 29 mmol/L (ref 22–32)
CREATININE: 1.06 mg/dL — AB (ref 0.44–1.00)
Chloride: 104 mmol/L (ref 101–111)
GFR calc Af Amer: >60 mL/min (ref >60)
GFR calc non Af Amer: 55 mL/min — ABNORMAL LOW (ref >60)
Glucose, Bld: 97 mg/dL (ref 65–99)
Potassium: 3.7 mmol/L (ref 3.5–5.1)
SODIUM: 141 mmol/L (ref 135–145)

## 2017-10-13 LAB — GLUCOSE, CAPILLARY: Glucose-Capillary: 124 mg/dL — ABNORMAL HIGH (ref 65–99)

## 2017-10-13 NOTE — Progress Notes (Signed)
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 Sees Dr. Luiz Ochoa, cardiologist in [place], [place]. States she's never had a cardiac cath, but states she has had an Echo and thinks a stress test. Pt is a type 2 diabetic. She states her last A1C was 2 months ago and it was 5.9. States Tonya Bash, NP is her provider for her diabetes. Pt states her fasting blood sugar is usually between 120-175. Have requested heart studies and last office visit note to be faxed to [place].

## 2017-10-13 NOTE — Pre-Procedure Instructions (Signed)
Tonya Shelton  10/13/2017    Your procedure is scheduled on Friday, October 15, 2017 at 10:00 AM.   Report to [facility] Entrance "A" Admitting Office at 8:00 AM.   Call this number if you have problems the morning of surgery: [phone]   Questions prior to day of surgery, please call [phone] between 8 & 4 PM.   Remember:  Do not eat food or drink liquids after midnight Thursday, 10/14/17.  Take these medicines the morning of surgery with A SIP OF WATER: Carvedilol (Coreg), Duloxetine (Cymbalta), Levothyroxine (Synthroid), Omeprazole (Prilosec), Tylenol - if needed, Albuterol (Proventil) inhaler - if needed - bring inhaler with you day of surgery  Stop Aspirin as instructed by surgeon or physician. Stop Multivitamins, Fish Oil and Herbal medications as of today prior to surgery. Do not use NSAIDS (Ibuprofen, Aleve, etc) or other Aspirin products prior to surgery.  Do not take Glimepiride and Tradjenta day of surgery.    How to Manage Your Diabetes Before Surgery   Why is it important to control my blood sugar before and after surgery?   Improving blood sugar levels before and after surgery helps healing and can limit problems.  A way of improving blood sugar control is eating a healthy diet by:  - Eating less sugar and carbohydrates  - Increasing activity/exercise  - Talk with your doctor about reaching your blood sugar goals  High blood sugars (greater than 180 mg/dL) can raise your risk of infections and slow down your recovery so you will need to focus on controlling your diabetes during the weeks before surgery.  Make sure that the doctor who takes care of your diabetes knows about your planned surgery including the date and location.  How do I manage my blood sugars before surgery?   Check your blood sugar at least 4 times a day, 2 days before surgery to make sure that they are not too high or low.  Check your blood sugar the morning of your  surgery when you wake up and every 2 hours until you get to the Short-Stay unit.  Treat a low blood sugar (less than 70 mg/dL) with 1/2 cup of clear juice (cranberry or apple), 4 glucose tablets, OR glucose gel.  Recheck blood sugar in 15 minutes after treatment (to make sure it is greater than 70 mg/dL).  If blood sugar is not greater than 70 mg/dL on re-check, call [phone] for further instructions.   Report your blood sugar to the Short-Stay nurse when you get to Short-Stay.  References:  [facility], 2007 "How to Manage your Diabetes Before and After Surgery".   Do not wear jewelry, make-up or nail polish.  Do not wear lotions, powders, perfumes or deodorant.  Do not shave 48 hours prior to surgery.    Do not bring valuables to the hospital.  [facility] is not responsible for any belongings or valuables.  Contacts, dentures or bridgework may not be worn into surgery.  Leave your suitcase in the car.  After surgery it may be brought to your room.  For patients admitted to the hospital, discharge time will be determined by your treatment team.  [facility] - Preparing for Surgery  Before surgery, you can play an important role.  Because skin is not sterile, your skin needs to be as free of germs as possible.  You can reduce the number of germs on you skin by washing with CHG (chlorahexidine gluconate) soap before surgery.  CHG is an antiseptic cleaner which kills germs and bonds with the skin to continue killing germs even after washing.  Please DO NOT use if you have an allergy to CHG or antibacterial soaps.  If your skin becomes reddened/irritated stop using the CHG and inform your nurse when you arrive at Short Stay.  Do not shave (including legs and underarms) for at least 48 hours prior to the first CHG shower.  You may shave your face.  Please follow these instructions carefully:   1.  Shower with CHG Soap the night before surgery and the                     morning of Surgery.  2.  If you choose to wash your hair, wash your hair first as usual with your       normal shampoo.  3.  After you shampoo, rinse your hair and body thoroughly to remove the shampoo.  4.  Use CHG as you would any other liquid soap.  You can apply chg directly       to the skin and wash gently with scrungie or a clean washcloth.  5.  Apply the CHG Soap to your body ONLY FROM THE NECK DOWN.        Do not use on open wounds or open sores.  Avoid contact with your eyes, ears, mouth and genitals (private parts).  Wash genitals (private parts) with your normal soap.  6.  Wash thoroughly, paying special attention to the area where your surgery        will be performed.  7.  Thoroughly rinse your body with warm water from the neck down.  8.  DO NOT shower/wash with your normal soap after using and rinsing off       the CHG Soap.  9.  Pat yourself dry with a clean towel.            10.  Wear clean pajamas.            11.  Place clean sheets on your bed the night of your first shower and do not        sleep with pets.  Day of Surgery  Shower as above. Do not apply any lotions/deodorants the morning of surgery.  Please wear clean clothes to the hospital.  Please read over the fact sheets that you were given.

## 2017-10-14 ENCOUNTER — Other Ambulatory Visit (INDEPENDENT_AMBULATORY_CARE_PROVIDER_SITE_OTHER): Payer: Self-pay | Admitting: Orthopedic Surgery

## 2017-10-14 NOTE — Progress Notes (Signed)
Anesthesia Chart Review: Patient is a 61 year old female scheduled for left L5-S1 microdiscectomy on 10/15/17 by Dr. Rodell Perna.   History includes former smoker, PVC's, HTN, OSA (CPAP with O2), CKD stage III, hypothyroidism, DM2, RLS, hiatal hernia, iron deficiency anemia, TIA '07, depression, osteoporosis,  cholecystectomy, tonsillectomy, neuroma surgery, cervical fusion '03. BMI is consistent with obesity.   - PCP is Rueben Bash, FNP. - Cardiologist is Dr. Laymond Purser. Last visit 05/19/17. Appears she got established following hospitalization 11/2014 for syncope with unremarkable echo and carotid duplex. Notes indicate that Holter monitor showed some PVCs and PFTs in the summer of 2017 showed restrictive defect likely due to her obesity. 2017 stress test was non-ischemic. Notes also state that she has diastolic dysfunction. She is on Coreg. Routine echo is scheduled for 12/20/17 with MD follow-up 01/17/18.   Meds include albuterol, Fosamax, aspirin 81 mg (last dose 10/11/17), Coreg, cevimeline (for xerostomia), Cymbalta, ferrous sulfate, Lasix, gabapentin, Amaryl, levothyroxine, magnesium oxide, nitroglycerin, fish oil, Prilosec, Tradjenta, Zanaflex, Januvia, pravastatin. She takes an un-named supplement for restless legs.   BP (!) 149/63   Pulse 71   Temp 36.8 C (Oral)   Resp 18   Ht 5\' 2"  (1.575 m)   Wt 207 lb 4 oz (94 kg)   SpO2 96%   BMI 37.91 kg/m   EKG 10/13/17: NSR.   Nuclear stress test 04/30/16 [facility]): Impression: 1. Normal study. 2. Lexiscan Cardiolite stress test with myocardial SPECT imaging does not show any convincing evidence of ischemia or infarction by nuclear criteria. Extremely small size low-grade fixed distal anterior defect with preserved wall motion is consistent with artifact. 3. Small left ventricular cavity size with hyperdynamic function. Ejection fraction 90%.  Echo 12/01/14 ([place]): Summary: Normal LV systolic function.  Ejection fraction is 55-60%. There is normal LV wall thickness. Normal right ventricular size with normal function.  Carotid U/S 12/01/14 ([place]): Summary: RICA stenosis < 50%. LICA stenosis < 81%. There is antegrade flow both right and left vertebral arteries.  Preoperative labs noted. BUN 13, Cr 1.06. Glucose 97. CBC WNL. She reported last A1c was 5.0 on 08/17/17.  If no acute changes then I anticipate that she can proceed as planned.  [facility]/Anesthesiology Phone [phone] 10/14/2017 10:25 AM

## 2017-10-15 ENCOUNTER — Observation Stay (HOSPITAL_COMMUNITY)
Admission: RE | Admit: 2017-10-15 | Discharge: 2017-10-16 | Disposition: A | Discharge status: Home / Self Care | Payer: BLUE CROSS/BLUE SHIELD | Source: Ambulatory Visit | Attending: Orthopaedic Surgery | Admitting: Orthopaedic Surgery

## 2017-10-15 ENCOUNTER — Encounter (HOSPITAL_COMMUNITY): Payer: Self-pay

## 2017-10-15 ENCOUNTER — Other Ambulatory Visit: Payer: Self-pay

## 2017-10-15 ENCOUNTER — Encounter (HOSPITAL_COMMUNITY)
Admission: RE | Disposition: A | Discharge status: Home / Self Care | Payer: Self-pay | Source: Ambulatory Visit | Attending: Orthopaedic Surgery

## 2017-10-15 ENCOUNTER — Ambulatory Visit (HOSPITAL_COMMUNITY): Payer: BLUE CROSS/BLUE SHIELD | Admitting: Anesthesiology

## 2017-10-15 ENCOUNTER — Ambulatory Visit (HOSPITAL_COMMUNITY): Payer: BLUE CROSS/BLUE SHIELD | Admitting: Vascular Surgery

## 2017-10-15 ENCOUNTER — Ambulatory Visit (HOSPITAL_COMMUNITY): Payer: BLUE CROSS/BLUE SHIELD

## 2017-10-15 DIAGNOSIS — M5416 Radiculopathy, lumbar region: Secondary | ICD-10-CM | POA: Diagnosis not present

## 2017-10-15 DIAGNOSIS — E669 Obesity, unspecified: Secondary | ICD-10-CM | POA: Insufficient documentation

## 2017-10-15 DIAGNOSIS — M810 Age-related osteoporosis without current pathological fracture: Secondary | ICD-10-CM | POA: Insufficient documentation

## 2017-10-15 DIAGNOSIS — M48061 Spinal stenosis, lumbar region without neurogenic claudication: Secondary | ICD-10-CM | POA: Diagnosis present

## 2017-10-15 DIAGNOSIS — Z7984 Long term (current) use of oral hypoglycemic drugs: Secondary | ICD-10-CM | POA: Insufficient documentation

## 2017-10-15 DIAGNOSIS — Z8673 Personal history of transient ischemic attack (TIA), and cerebral infarction without residual deficits: Secondary | ICD-10-CM | POA: Insufficient documentation

## 2017-10-15 DIAGNOSIS — Z881 Allergy status to other antibiotic agents status: Secondary | ICD-10-CM | POA: Insufficient documentation

## 2017-10-15 DIAGNOSIS — M5116 Intervertebral disc disorders with radiculopathy, lumbar region: Principal | ICD-10-CM | POA: Insufficient documentation

## 2017-10-15 DIAGNOSIS — Z419 Encounter for procedure for purposes other than remedying health state, unspecified: Secondary | ICD-10-CM

## 2017-10-15 DIAGNOSIS — I129 Hypertensive chronic kidney disease with stage 1 through stage 4 chronic kidney disease, or unspecified chronic kidney disease: Secondary | ICD-10-CM | POA: Insufficient documentation

## 2017-10-15 DIAGNOSIS — M5126 Other intervertebral disc displacement, lumbar region: Secondary | ICD-10-CM | POA: Diagnosis not present

## 2017-10-15 DIAGNOSIS — E1122 Type 2 diabetes mellitus with diabetic chronic kidney disease: Secondary | ICD-10-CM | POA: Insufficient documentation

## 2017-10-15 DIAGNOSIS — K219 Gastro-esophageal reflux disease without esophagitis: Secondary | ICD-10-CM | POA: Insufficient documentation

## 2017-10-15 DIAGNOSIS — Z79899 Other long term (current) drug therapy: Secondary | ICD-10-CM | POA: Insufficient documentation

## 2017-10-15 DIAGNOSIS — N183 Chronic kidney disease, stage 3 (moderate): Secondary | ICD-10-CM | POA: Insufficient documentation

## 2017-10-15 DIAGNOSIS — G2581 Restless legs syndrome: Secondary | ICD-10-CM | POA: Insufficient documentation

## 2017-10-15 DIAGNOSIS — E039 Hypothyroidism, unspecified: Secondary | ICD-10-CM | POA: Insufficient documentation

## 2017-10-15 DIAGNOSIS — F329 Major depressive disorder, single episode, unspecified: Secondary | ICD-10-CM | POA: Insufficient documentation

## 2017-10-15 DIAGNOSIS — Z6837 Body mass index (BMI) 37.0-37.9, adult: Secondary | ICD-10-CM | POA: Insufficient documentation

## 2017-10-15 DIAGNOSIS — G4730 Sleep apnea, unspecified: Secondary | ICD-10-CM | POA: Insufficient documentation

## 2017-10-15 DIAGNOSIS — E1140 Type 2 diabetes mellitus with diabetic neuropathy, unspecified: Secondary | ICD-10-CM | POA: Insufficient documentation

## 2017-10-15 HISTORY — PX: LUMBAR LAMINECTOMY/DECOMPRESSION MICRODISCECTOMY: SHX5026

## 2017-10-15 LAB — GLUCOSE, CAPILLARY
GLUCOSE-CAPILLARY: 132 mg/dL — AB (ref 65–99)
GLUCOSE-CAPILLARY: 203 mg/dL — AB (ref 65–99)
GLUCOSE-CAPILLARY: 200 mg/dL — AB (ref 65–99)
Glucose-Capillary: 103 mg/dL — ABNORMAL HIGH (ref 65–99)
Glucose-Capillary: 131 mg/dL — ABNORMAL HIGH (ref 65–99)

## 2017-10-15 SURGERY — LUMBAR LAMINECTOMY/DECOMPRESSION MICRODISCECTOMY
Anesthesia: General

## 2017-10-15 MED ORDER — SODIUM CHLORIDE 0.9% FLUSH
3.0000 mL | Freq: Two times a day (BID) | INTRAVENOUS | Status: DC
  Administered 2017-10-15: 3 mL via INTRAVENOUS

## 2017-10-15 MED ORDER — SODIUM CHLORIDE 0.9% FLUSH: 3.0000 mL | INTRAVENOUS | Status: DC | PRN

## 2017-10-15 MED ORDER — THROMBIN (RECOMBINANT) 5000 UNITS EX SOLR
CUTANEOUS | Status: DC | PRN
  Administered 2017-10-15: 5000 [IU] via TOPICAL

## 2017-10-15 MED ORDER — PHENYLEPHRINE HCL 10 MG/ML IJ SOLN
INTRAMUSCULAR | Status: DC | PRN
  Administered 2017-10-15 (×2): 80 ug via INTRAVENOUS
  Administered 2017-10-15: 120 ug via INTRAVENOUS

## 2017-10-15 MED ORDER — CEFAZOLIN SODIUM-DEXTROSE 2-4 GM/100ML-% IV SOLN
INTRAVENOUS | Status: AC
  Filled 2017-10-15: qty 100

## 2017-10-15 MED ORDER — ONDANSETRON HCL 4 MG/2ML IJ SOLN: 4.0000 mg | Freq: Four times a day (QID) | INTRAMUSCULAR | Status: DC | PRN

## 2017-10-15 MED ORDER — ALBUTEROL SULFATE (2.5 MG/3ML) 0.083% IN NEBU: 3.0000 mL | INHALATION_SOLUTION | Freq: Four times a day (QID) | RESPIRATORY_TRACT | Status: DC | PRN

## 2017-10-15 MED ORDER — MAGNESIUM OXIDE 400 (241.3 MG) MG PO TABS
400.0000 mg | ORAL_TABLET | Freq: Every day | ORAL | Status: DC
  Administered 2017-10-15 – 2017-10-16 (×2): 400 mg via ORAL
  Filled 2017-10-15 (×2): qty 1

## 2017-10-15 MED ORDER — METHOCARBAMOL 500 MG PO TABS
ORAL_TABLET | ORAL | Status: AC
  Filled 2017-10-15: qty 1

## 2017-10-15 MED ORDER — POLYETHYLENE GLYCOL 3350 17 G PO PACK: 17.0000 g | PACK | Freq: Every day | ORAL | Status: DC | PRN

## 2017-10-15 MED ORDER — SODIUM CHLORIDE 0.9 % IV SOLN: INTRAVENOUS | Status: DC

## 2017-10-15 MED ORDER — OXYCODONE HCL 5 MG PO TABS
5.0000 mg | ORAL_TABLET | ORAL | Status: DC | PRN
  Administered 2017-10-15 – 2017-10-16 (×5): 5 mg via ORAL
  Filled 2017-10-15 (×4): qty 1

## 2017-10-15 MED ORDER — FENTANYL CITRATE (PF) 100 MCG/2ML IJ SOLN
INTRAMUSCULAR | Status: DC | PRN
  Administered 2017-10-15: 250 ug via INTRAVENOUS

## 2017-10-15 MED ORDER — PANTOPRAZOLE SODIUM 40 MG PO TBEC
40.0000 mg | DELAYED_RELEASE_TABLET | Freq: Every day | ORAL | Status: DC
  Administered 2017-10-16: 40 mg via ORAL
  Filled 2017-10-15: qty 1

## 2017-10-15 MED ORDER — MENTHOL 3 MG MT LOZG: 1.0000 | LOZENGE | OROMUCOSAL | Status: DC | PRN

## 2017-10-15 MED ORDER — MIDAZOLAM HCL 2 MG/2ML IJ SOLN
INTRAMUSCULAR | Status: AC
  Filled 2017-10-15: qty 2

## 2017-10-15 MED ORDER — BUPIVACAINE HCL (PF) 0.25 % IJ SOLN
INTRAMUSCULAR | Status: DC | PRN
  Administered 2017-10-15: 10 mL

## 2017-10-15 MED ORDER — METHOCARBAMOL 500 MG PO TABS: 500.0000 mg | ORAL_TABLET | Freq: Four times a day (QID) | ORAL | 0 refills | Status: DC | PRN

## 2017-10-15 MED ORDER — SODIUM CHLORIDE 0.9 % IV SOLN: 250.0000 mL | INTRAVENOUS | Status: DC

## 2017-10-15 MED ORDER — LINAGLIPTIN 5 MG PO TABS
5.0000 mg | ORAL_TABLET | Freq: Every day | ORAL | Status: DC
  Administered 2017-10-16: 5 mg via ORAL
  Filled 2017-10-15: qty 1

## 2017-10-15 MED ORDER — CEVIMELINE HCL 30 MG PO CAPS: 30.0000 mg | ORAL_CAPSULE | Freq: Two times a day (BID) | ORAL | Status: DC

## 2017-10-15 MED ORDER — LIDOCAINE 2% (20 MG/ML) 5 ML SYRINGE
INTRAMUSCULAR | Status: AC
  Filled 2017-10-15: qty 5

## 2017-10-15 MED ORDER — NITROGLYCERIN 0.4 MG SL SUBL: 0.4000 mg | SUBLINGUAL_TABLET | SUBLINGUAL | Status: DC | PRN

## 2017-10-15 MED ORDER — LIDOCAINE HCL (CARDIAC) 20 MG/ML IV SOLN
INTRAVENOUS | Status: DC | PRN
  Administered 2017-10-15: 40 mg via INTRAVENOUS

## 2017-10-15 MED ORDER — PROPOFOL 10 MG/ML IV BOLUS
INTRAVENOUS | Status: AC
  Filled 2017-10-15: qty 20

## 2017-10-15 MED ORDER — ACETAMINOPHEN 650 MG RE SUPP: 650.0000 mg | RECTAL | Status: DC | PRN

## 2017-10-15 MED ORDER — DULOXETINE HCL 30 MG PO CPEP
60.0000 mg | ORAL_CAPSULE | Freq: Every day | ORAL | Status: DC
  Administered 2017-10-16: 60 mg via ORAL
  Filled 2017-10-15: qty 2

## 2017-10-15 MED ORDER — EPHEDRINE SULFATE 50 MG/ML IJ SOLN
INTRAMUSCULAR | Status: DC | PRN
  Administered 2017-10-15: 5 mg via INTRAVENOUS
  Administered 2017-10-15: 10 mg via INTRAVENOUS
  Administered 2017-10-15 (×4): 5 mg via INTRAVENOUS

## 2017-10-15 MED ORDER — 0.9 % SODIUM CHLORIDE (POUR BTL) OPTIME
TOPICAL | Status: DC | PRN
  Administered 2017-10-15: 1000 mL

## 2017-10-15 MED ORDER — PHENOL 1.4 % MT LIQD: 1.0000 | OROMUCOSAL | Status: DC | PRN

## 2017-10-15 MED ORDER — OXYCODONE-ACETAMINOPHEN 5-325 MG PO TABS: 1.0000 | ORAL_TABLET | Freq: Four times a day (QID) | ORAL | 0 refills | Status: DC | PRN

## 2017-10-15 MED ORDER — METHOCARBAMOL 500 MG PO TABS
500.0000 mg | ORAL_TABLET | Freq: Four times a day (QID) | ORAL | Status: DC | PRN
  Administered 2017-10-15 – 2017-10-16 (×3): 500 mg via ORAL
  Filled 2017-10-15 (×2): qty 1

## 2017-10-15 MED ORDER — CEFAZOLIN SODIUM-DEXTROSE 2-4 GM/100ML-% IV SOLN
2.0000 g | INTRAVENOUS | Status: AC
  Administered 2017-10-15: 2 g via INTRAVENOUS

## 2017-10-15 MED ORDER — BUPIVACAINE HCL (PF) 0.25 % IJ SOLN
INTRAMUSCULAR | Status: AC
  Filled 2017-10-15: qty 30

## 2017-10-15 MED ORDER — FERROUS SULFATE 325 (65 FE) MG PO TABS
325.0000 mg | ORAL_TABLET | Freq: Two times a day (BID) | ORAL | Status: DC
  Administered 2017-10-15 – 2017-10-16 (×2): 325 mg via ORAL
  Filled 2017-10-15 (×2): qty 1

## 2017-10-15 MED ORDER — ROCURONIUM BROMIDE 100 MG/10ML IV SOLN
INTRAVENOUS | Status: DC | PRN
  Administered 2017-10-15: 50 mg via INTRAVENOUS

## 2017-10-15 MED ORDER — DEXAMETHASONE SODIUM PHOSPHATE 10 MG/ML IJ SOLN
INTRAMUSCULAR | Status: AC
  Filled 2017-10-15: qty 1

## 2017-10-15 MED ORDER — DOCUSATE SODIUM 100 MG PO CAPS
100.0000 mg | ORAL_CAPSULE | Freq: Two times a day (BID) | ORAL | Status: DC
  Administered 2017-10-15 – 2017-10-16 (×2): 100 mg via ORAL
  Filled 2017-10-15 (×2): qty 1

## 2017-10-15 MED ORDER — LEVOTHYROXINE SODIUM 88 MCG PO TABS
88.0000 ug | ORAL_TABLET | Freq: Every day | ORAL | Status: DC
  Administered 2017-10-16: 88 ug via ORAL
  Filled 2017-10-15: qty 1

## 2017-10-15 MED ORDER — ACETAMINOPHEN 325 MG PO TABS: 650.0000 mg | ORAL_TABLET | ORAL | Status: DC | PRN

## 2017-10-15 MED ORDER — DEXAMETHASONE SODIUM PHOSPHATE 10 MG/ML IJ SOLN
INTRAMUSCULAR | Status: DC | PRN
  Administered 2017-10-15: 4 mg via INTRAVENOUS

## 2017-10-15 MED ORDER — ONDANSETRON HCL 4 MG/2ML IJ SOLN
INTRAMUSCULAR | Status: AC
  Filled 2017-10-15: qty 2

## 2017-10-15 MED ORDER — ONDANSETRON HCL 4 MG/2ML IJ SOLN: INTRAMUSCULAR | Status: DC | PRN

## 2017-10-15 MED ORDER — CARVEDILOL 25 MG PO TABS
25.0000 mg | ORAL_TABLET | Freq: Two times a day (BID) | ORAL | Status: DC
  Administered 2017-10-15 – 2017-10-16 (×2): 25 mg via ORAL
  Filled 2017-10-15 (×2): qty 1

## 2017-10-15 MED ORDER — LACTATED RINGERS IV SOLN
INTRAVENOUS | Status: DC
  Administered 2017-10-15 (×2): via INTRAVENOUS

## 2017-10-15 MED ORDER — CHLORHEXIDINE GLUCONATE 4 % EX LIQD: 60.0000 mL | Freq: Once | CUTANEOUS | Status: DC

## 2017-10-15 MED ORDER — SUGAMMADEX SODIUM 200 MG/2ML IV SOLN
INTRAVENOUS | Status: DC | PRN
  Administered 2017-10-15: 200 mg via INTRAVENOUS

## 2017-10-15 MED ORDER — METHOCARBAMOL 1000 MG/10ML IJ SOLN: 500.0000 mg | Freq: Four times a day (QID) | INTRAVENOUS | Status: DC | PRN

## 2017-10-15 MED ORDER — IRBESARTAN 300 MG PO TABS
300.0000 mg | ORAL_TABLET | Freq: Every day | ORAL | Status: DC
  Administered 2017-10-16: 300 mg via ORAL
  Filled 2017-10-15: qty 1

## 2017-10-15 MED ORDER — OXYCODONE HCL 5 MG PO TABS
ORAL_TABLET | ORAL | Status: AC
  Filled 2017-10-15: qty 1

## 2017-10-15 MED ORDER — GLIMEPIRIDE 2 MG PO TABS
2.0000 mg | ORAL_TABLET | Freq: Every day | ORAL | Status: DC
  Administered 2017-10-16: 2 mg via ORAL
  Filled 2017-10-15: qty 1

## 2017-10-15 MED ORDER — FUROSEMIDE 20 MG PO TABS
20.0000 mg | ORAL_TABLET | Freq: Every day | ORAL | Status: DC
  Administered 2017-10-15: 20 mg via ORAL
  Filled 2017-10-15 (×2): qty 1

## 2017-10-15 MED ORDER — GABAPENTIN 100 MG PO CAPS
200.0000 mg | ORAL_CAPSULE | Freq: Every day | ORAL | Status: DC
  Administered 2017-10-15: 200 mg via ORAL
  Filled 2017-10-15: qty 2

## 2017-10-15 MED ORDER — ONDANSETRON HCL 4 MG/2ML IJ SOLN
INTRAMUSCULAR | Status: DC | PRN
  Administered 2017-10-15: 4 mg via INTRAVENOUS

## 2017-10-15 MED ORDER — PROPOFOL 10 MG/ML IV BOLUS
INTRAVENOUS | Status: DC | PRN
  Administered 2017-10-15: 80 mg via INTRAVENOUS

## 2017-10-15 MED ORDER — PRAVASTATIN SODIUM 40 MG PO TABS
40.0000 mg | ORAL_TABLET | Freq: Every day | ORAL | Status: DC
  Administered 2017-10-15: 40 mg via ORAL
  Filled 2017-10-15: qty 1

## 2017-10-15 MED ORDER — ONDANSETRON HCL 4 MG PO TABS: 4.0000 mg | ORAL_TABLET | Freq: Four times a day (QID) | ORAL | Status: DC | PRN

## 2017-10-15 MED ORDER — THROMBIN (RECOMBINANT) 5000 UNITS EX SOLR
CUTANEOUS | Status: AC
  Filled 2017-10-15: qty 5000

## 2017-10-15 MED ORDER — MIDAZOLAM HCL 5 MG/5ML IJ SOLN
INTRAMUSCULAR | Status: DC | PRN
  Administered 2017-10-15: 1 mg via INTRAVENOUS

## 2017-10-15 MED ORDER — CEFAZOLIN SODIUM-DEXTROSE 1-4 GM/50ML-% IV SOLN
1.0000 g | Freq: Three times a day (TID) | INTRAVENOUS | Status: AC
  Administered 2017-10-15 – 2017-10-16 (×2): 1 g via INTRAVENOUS
  Filled 2017-10-15 (×2): qty 50

## 2017-10-15 MED ORDER — FENTANYL CITRATE (PF) 250 MCG/5ML IJ SOLN
INTRAMUSCULAR | Status: AC
  Filled 2017-10-15: qty 5

## 2017-10-15 MED ORDER — HYDROMORPHONE HCL 1 MG/ML IJ SOLN: 0.5000 mg | INTRAMUSCULAR | Status: DC | PRN

## 2017-10-15 SURGICAL SUPPLY — 44 items
BUR ROUND FLUTED 4 SOFT TCH (BURR) ×2 IMPLANT
BUR ROUND FLUTED 4MM SOFT TCH (BURR) ×1
CANISTER SUCT 3000ML PPV (MISCELLANEOUS) ×3 IMPLANT
CLOSURE STERI-STRIP 1/2X4 (GAUZE/BANDAGES/DRESSINGS) ×1
CLSR STERI-STRIP ANTIMIC 1/2X4 (GAUZE/BANDAGES/DRESSINGS) ×2 IMPLANT
COVER SURGICAL LIGHT HANDLE (MISCELLANEOUS) ×3 IMPLANT
DECANTER SPIKE VIAL GLASS SM (MISCELLANEOUS) ×3 IMPLANT
DRAPE HALF SHEET 40X57 (DRAPES) ×6 IMPLANT
DRAPE MICROSCOPE LEICA (MISCELLANEOUS) ×3 IMPLANT
DRAPE SURG 17X23 STRL (DRAPES) ×3 IMPLANT
DRSG MEPILEX BORDER 4X4 (GAUZE/BANDAGES/DRESSINGS) ×3 IMPLANT
DURAPREP 26ML APPLICATOR (WOUND CARE) ×3 IMPLANT
ELECT BLADE 6.5 EXT (BLADE) ×3 IMPLANT
ELECT REM PT RETURN 9FT ADLT (ELECTROSURGICAL) ×3
ELECTRODE REM PT RTRN 9FT ADLT (ELECTROSURGICAL) ×1 IMPLANT
GLOVE BIOGEL PI IND STRL 8 (GLOVE) ×2 IMPLANT
GLOVE BIOGEL PI INDICATOR 8 (GLOVE) ×4
GLOVE ORTHO TXT STRL SZ7.5 (GLOVE) ×6 IMPLANT
GOWN STRL REUS W/ TWL LRG LVL3 (GOWN DISPOSABLE) ×2 IMPLANT
GOWN STRL REUS W/ TWL XL LVL3 (GOWN DISPOSABLE) ×1 IMPLANT
GOWN STRL REUS W/TWL 2XL LVL3 (GOWN DISPOSABLE) ×3 IMPLANT
GOWN STRL REUS W/TWL LRG LVL3 (GOWN DISPOSABLE) ×4
GOWN STRL REUS W/TWL XL LVL3 (GOWN DISPOSABLE) ×2
KIT BASIN OR (CUSTOM PROCEDURE TRAY) ×3 IMPLANT
KIT ROOM TURNOVER OR (KITS) ×3 IMPLANT
MANIFOLD NEPTUNE II (INSTRUMENTS) ×3 IMPLANT
NEEDLE HYPO 25GX1X1/2 BEV (NEEDLE) ×3 IMPLANT
NEEDLE SPNL 18GX3.5 QUINCKE PK (NEEDLE) ×3 IMPLANT
NS IRRIG 1000ML POUR BTL (IV SOLUTION) ×3 IMPLANT
PACK LAMINECTOMY ORTHO (CUSTOM PROCEDURE TRAY) ×3 IMPLANT
PAD ARMBOARD 7.5X6 YLW CONV (MISCELLANEOUS) ×6 IMPLANT
PATTIES SURGICAL .5 X.5 (GAUZE/BANDAGES/DRESSINGS) IMPLANT
PATTIES SURGICAL .75X.75 (GAUZE/BANDAGES/DRESSINGS) IMPLANT
SPOGE SURGIFLO 8M (HEMOSTASIS) ×2
SPONGE SURGIFLO 8M (HEMOSTASIS) ×1 IMPLANT
SUT VIC AB 0 CT1 27 (SUTURE)
SUT VIC AB 0 CT1 27XBRD ANBCTR (SUTURE) IMPLANT
SUT VIC AB 1 CTX 36 (SUTURE) ×2
SUT VIC AB 1 CTX36XBRD ANBCTR (SUTURE) ×1 IMPLANT
SUT VIC AB 2-0 CT1 27 (SUTURE) ×2
SUT VIC AB 2-0 CT1 TAPERPNT 27 (SUTURE) ×1 IMPLANT
SUT VIC AB 3-0 X1 27 (SUTURE) ×3 IMPLANT
TOWEL OR 17X24 6PK STRL BLUE (TOWEL DISPOSABLE) ×3 IMPLANT
TOWEL OR 17X26 10 PK STRL BLUE (TOWEL DISPOSABLE) ×3 IMPLANT

## 2017-10-15 NOTE — Anesthesia Postprocedure Evaluation (Signed)
Anesthesia Post Note  Patient: Tonya Shelton  Procedure(s) Performed: LEFT L5-S1 MICRODISCECTOMY (N/A )     Patient location during evaluation: PACU Anesthesia Type: General Level of consciousness: awake and alert, oriented and patient cooperative Pain management: pain level controlled Vital Signs Assessment: post-procedure vital signs reviewed and stable Respiratory status: spontaneous breathing, nonlabored ventilation, respiratory function stable and patient connected to nasal cannula oxygen Cardiovascular status: blood pressure returned to baseline and stable Postop Assessment: no apparent nausea or vomiting Anesthetic complications: no    Last Vitals:  Vitals:   10/15/17 1210 10/15/17 1246  BP:  107/85  Pulse: 75 87  Resp: 18 18  Temp:  36.5 C  SpO2: 92% 93%    Last Pain:  Vitals:   10/15/17 1210  TempSrc:   PainSc: 4                  Roniyah Llorens,E. Metha Kolasa

## 2017-10-15 NOTE — Op Note (Signed)
Preop diagnosis: Left L5-S1 disc protrusion with radiculopathy.  Postop diagnosis: Same  Procedure: Left L5-S1 microdiscectomy.  Surgeon: Rodell Perna MD  Assistant: Benjiman Core PA-C medically necessary and present for the entire procedure  Anesthesia: General plus Marcaine local  EBL: Minimal see anesthetic record  Findings: Large disc protrusion with contained disc fragment underneath the ligamentum is more prominent than preoperative MRI scan.  Procedure: After induction of general anesthesia preoperative Ancef prophylaxis prone positioning on chest rolls careful padding and positioning Pumpers Ancef prophylaxis timeout procedure needle localization after areas were squared with towels Betadine Steri-Drape application palpation and placement of an 18-gauge spinal needle with crosstable lateral x-ray.  Incision was made a few millimeters to the left of the midline subperiosteal dissection at L5-S1 was performed and a Penfield 4 was placed at the interlaminar space confirming the appropriate level.  Left sacrum was well visualized and left L5 hemilaminotomy was performed thinning the lamina with the bur as well as the medial portion of the facet out to the level of the pedicle.  Small Kerrisons were used ligamentum was removed which was hypertrophic.  A very large disc bulge which was twice the size of preoperative MRI scan was present.  Patient had recent significant increase in her pain annulus was incised and a large fragment immediately delivered decompressed the left lateral recess.  3 passes were made into the disc space removing chunks of disc.  The [facility] Was used for palpation anterior to the dura the disc was flat in the midline.  Passes were made with a micropituitary straight pituitary up and down pituitary and Epstein curettes to make sure all of the protruding disc was removed.  Disc was flat nerve root was free as it went underneath the pedicle and out laterally.  The small portion of  S1 was removed over the dorsum on the left side for visualization the axilla of the nerve root was free there were no free fragments.  Irrigation with saline solution.  Epidural space was dry.  Standard closure with #1 Vicryl in the fascia until Vicryl subtendinous tissue subarticular Vicryl closure with Dermabond in the skin postop dressing and transferred to recovery room with good relief of preop leg pain.

## 2017-10-15 NOTE — Transfer of Care (Signed)
Immediate Anesthesia Transfer of Care Note  Patient: Tonya Shelton  Procedure(s) Performed: LEFT L5-S1 MICRODISCECTOMY (N/A )  Patient Location: PACU  Anesthesia Type:General  Level of Consciousness: awake, oriented and patient cooperative  Airway & Oxygen Therapy: Patient Spontanous Breathing and Patient connected to face mask oxygen  Post-op Assessment: Report given to RN and Post -op Vital signs reviewed and stable  Post vital signs: Reviewed  Last Vitals:  Vitals:   10/15/17 0825  BP: (!) 118/49  Pulse: (!) 57  Resp: 18  Temp: 36.9 C  SpO2: 97%    Last Pain:  Vitals:   10/15/17 0825  TempSrc: Oral  PainSc: 4          Complications: No apparent anesthesia complications

## 2017-10-15 NOTE — H&P (Signed)
Pennelope Basque is an 61 y.o. female.   Chief Complaint: back pain and LE radiculopathy HPI:  Patient with hx of L5-S1 HNP and above complaint presents for surgical intervention.  Progressively worsening symptoms.  Failed conservative treatment.    Past Medical History:  Diagnosis Date  . Acid reflux   . Anemia    low iron  . Arthritis   . Chronic kidney disease    Stage 3 - Dr. Iona Beard in [place], [place]  . Depression   . Diabetes mellitus without complication ([place])    type 2  . Family history of adverse reaction to anesthesia    mom and sister have n/v  . H/O bladder problems    leaks  . History of hiatal hernia   . Hypertension   . Hypothyroidism   . Neuropathy   . Osteoporosis   . PVC's (premature ventricular contractions)   . Restless legs   . Sleep apnea    uses cpap with oxygen  . Stroke [facility]) 2007   TIA    Past Surgical History:  Procedure Laterality Date  . BUNIONECTOMY    . GALLBLADDER SURGERY    . NEUROMA SURGERY    . SPINAL FUSION  2003   cervical  . tonsillectomy      Family History  Problem Relation Age of Onset  . Breast cancer Mother   . Endometriosis Mother   . Alcoholism Father    Social History:  reports that she has quit smoking. she has never used smokeless tobacco. She reports that she does not drink alcohol or use drugs.  Allergies:  Allergies  Allergen Reactions  . Erythromycin Other (See Comments)    Eye ointment only:  Burned retinas  . Povidone Iodine Other (See Comments)    betadine eye drop caused burning in eyes    No medications prior to admission.    Results for orders placed or performed during the hospital encounter of 10/13/17 (from the past 48 hour(s))  Glucose, capillary     Status: Abnormal   Collection Time: 10/13/17  1:01 PM  Result Value Ref Range   Glucose-Capillary 124 (H) 65 - 99 mg/dL   Comment 1 Notify RN    Comment 2 Document in Chart   Surgical pcr screen     Status: None   Collection Time: 10/13/17   1:30 PM  Result Value Ref Range   MRSA, PCR NEGATIVE NEGATIVE   Staphylococcus aureus NEGATIVE NEGATIVE    Comment: (NOTE) The Xpert SA Assay (FDA approved for NASAL specimens in patients 3 years of age and older), is one component of a comprehensive surveillance program. It is not intended to diagnose infection nor to guide or monitor treatment.   Basic metabolic panel     Status: Abnormal   Collection Time: 10/13/17  1:33 PM  Result Value Ref Range   Sodium 141 135 - 145 mmol/L   Potassium 3.7 3.5 - 5.1 mmol/L   Chloride 104 101 - 111 mmol/L   CO2 29 22 - 32 mmol/L   Glucose, Bld 97 65 - 99 mg/dL   BUN 13 6 - 20 mg/dL   Creatinine, Ser 1.06 (H) 0.44 - 1.00 mg/dL   Calcium 8.9 8.9 - 10.3 mg/dL   GFR calc non Af Amer 55 (L) >60 mL/min   GFR calc Af Amer >60 >60 mL/min    Comment: (NOTE) The eGFR has been calculated using the CKD EPI equation. This calculation has not been validated in all clinical  situations. eGFR's persistently <60 mL/min signify possible Chronic Kidney Disease.    Anion gap 8 5 - 15  CBC     Status: None   Collection Time: 10/13/17  1:33 PM  Result Value Ref Range   WBC 10.4 4.0 - 10.5 K/uL   RBC 4.01 3.87 - 5.11 MIL/uL   Hemoglobin 12.6 12.0 - 15.0 g/dL   HCT 38.5 36.0 - 46.0 %   MCV 96.0 78.0 - 100.0 fL   MCH 31.4 26.0 - 34.0 pg   MCHC 32.7 30.0 - 36.0 g/dL   RDW 13.8 11.5 - 15.5 %   Platelets 239 150 - 400 K/uL   No results found.  Review of Systems  Constitutional: Negative.   HENT: Negative.   Eyes: Negative.   Respiratory: Negative.   Cardiovascular: Negative.   Gastrointestinal: Negative.   Genitourinary: Negative.   Musculoskeletal: Positive for back pain.  Skin: Negative.   Neurological: Positive for tingling.  Psychiatric/Behavioral: Negative.     There were no vitals taken for this visit. Physical Exam   Assessment/Plan Left L5-S1 HNP  We will proceed with LEFT L5-S1 MICRODISCECTOMY as scheduled.  Surgical procedure  along with possible risks and complications discussed.  All questions answered and wishes to proceed.   Benjiman Core, PA-C 10/15/2017, 7:01 AM

## 2017-10-15 NOTE — Interval H&P Note (Signed)
History and Physical Interval Note:  10/15/2017 9:07 AM  Tonya Shelton  has presented today for surgery, with the diagnosis of Left L5-S1 Disc Protrusion  The various methods of treatment have been discussed with the patient and family. After consideration of risks, benefits and other options for treatment, the patient has consented to  Procedure(s): LEFT L5-S1 MICRODISCECTOMY (N/A) as a surgical intervention .  The patient's history has been reviewed, patient examined, no change in status, stable for surgery.  I have reviewed the patient's chart and labs.  Questions were answered to the patient's satisfaction.     [name]

## 2017-10-15 NOTE — Anesthesia Preprocedure Evaluation (Addendum)
Anesthesia Evaluation  Patient identified by MRN, date of birth, ID band Patient awake    Reviewed: Allergy & Precautions, NPO status , Patient's Chart, lab work & pertinent test results  History of Anesthesia Complications Negative for: history of anesthetic complications  Airway Mallampati: II  TM Distance: >3 FB Neck ROM: Full    Dental  (+) Lower Dentures, Upper Dentures, Dental Advisory Given   Pulmonary sleep apnea, Continuous Positive Airway Pressure Ventilation and Oxygen sleep apnea , COPD,  COPD inhaler, former smoker,    breath sounds clear to auscultation       Cardiovascular hypertension, Pt. on medications and Pt. on home beta blockers  Rhythm:Regular Rate:Normal     Neuro/Psych TIA   GI/Hepatic GERD  Medicated,  Endo/Other  diabetes (glu 132), Oral Hypoglycemic AgentsHypothyroidism   Renal/GU      Musculoskeletal   Abdominal (+) + obese,   Peds  Hematology   Anesthesia Other Findings   Reproductive/Obstetrics                          Anesthesia Physical Anesthesia Plan  ASA: III  Anesthesia Plan: General   Post-op Pain Management:    Induction:   PONV Risk Score and Plan: 3 and Ondansetron, Dexamethasone and Midazolam  Airway Management Planned: Oral ETT  Additional Equipment:   Intra-op Plan:   Post-operative Plan: Extubation in OR  Informed Consent: I have reviewed the patients History and Physical, chart, labs and discussed the procedure including the risks, benefits and alternatives for the proposed anesthesia with the patient or authorized representative who has indicated his/her understanding and acceptance.   Dental advisory given  Plan Discussed with: CRNA and Surgeon  Anesthesia Plan Comments:         Anesthesia Quick Evaluation

## 2017-10-15 NOTE — Anesthesia Procedure Notes (Signed)
Procedure Name: Intubation Date/Time: 10/15/2017 10:08 AM Performed by: Jenne Campus, CRNA Pre-anesthesia Checklist: Patient identified, Emergency Drugs available, Suction available and Patient being monitored Patient Re-evaluated:Patient Re-evaluated prior to induction Oxygen Delivery Method: Circle System Utilized Preoxygenation: Pre-oxygenation with 100% oxygen Induction Type: IV induction Ventilation: Mask ventilation without difficulty Laryngoscope Size: Miller and 2 Grade View: Grade I Tube type: Oral Tube size: 7.0 mm Number of attempts: 1 Airway Equipment and Method: Stylet and Oral airway Placement Confirmation: ETT inserted through vocal cords under direct vision,  positive ETCO2 and breath sounds checked- equal and bilateral Secured at: 22 cm Tube secured with: Tape Dental Injury: Teeth and Oropharynx as per pre-operative assessment

## 2017-10-16 ENCOUNTER — Encounter (HOSPITAL_COMMUNITY): Payer: Self-pay | Admitting: Orthopaedic Surgery

## 2017-10-16 DIAGNOSIS — M5116 Intervertebral disc disorders with radiculopathy, lumbar region: Secondary | ICD-10-CM | POA: Diagnosis not present

## 2017-10-16 LAB — GLUCOSE, CAPILLARY: Glucose-Capillary: 154 mg/dL — ABNORMAL HIGH (ref 65–99)

## 2017-10-16 NOTE — Discharge Instructions (Addendum)
ORTHOPEDIC DISCHARGE INSTRUCTIONS  -Okay to shower 2 days postop. No tub soaking. Do not apply any creams or ointments to incision. Daily dressing changes with 4 x 4 gauze and tape.  -No driving until further notice.  -No lifting, bending, twisting.  -Okay to do some walking inside house. Nothing too aggressive.  - walk daily. Avoid sitting, OK to lay down or be in a recliner. Ok to sit for eating and bathroom. Gradually increase distance walking daily

## 2017-10-16 NOTE — Progress Notes (Signed)
Patient is discharged from room 3C08 at this time. Alert and in stable condition. IV site d/c'[d and instructions read to patient and spouse with understanding verbalized. Left unit via wheelchair with all belongings at side. 

## 2017-10-16 NOTE — Progress Notes (Signed)
   Subjective: 1 Day Post-Op Procedure(s) (LRB): LEFT L5-S1 MICRODISCECTOMY (N/A) Patient reports pain as mild.  Leg pain gone. Ambulating in halls.  Objective: Vital signs in last 24 hours: Temp:  [97.7 F (36.5 C)-99 F (37.2 C)] 99 F (37.2 C) (12/22 0813) Pulse Rate:  [75-94] 84 (12/22 0813) Resp:  [15-18] 18 (12/22 0813) BP: (98-141)/(50-85) 125/66 (12/22 0813) SpO2:  [92 %-100 %] 96 % (12/22 0813)  Intake/Output from previous day: 12/21 0701 - 12/22 0700 In: 2310 [P.O.:760; I.V.:1500; IV Piggyback:50] Out: 30 [Blood:30] Intake/Output this shift: No intake/output data recorded.  Recent Labs    10/13/17 1333  HGB 12.6   Recent Labs    10/13/17 1333  WBC 10.4  RBC 4.01  HCT 38.5  PLT 239   Recent Labs    10/13/17 1333  NA 141  K 3.7  CL 104  CO2 29  BUN 13  CREATININE 1.06*  GLUCOSE 97  CALCIUM 8.9   No results for input(s): LABPT, INR in the last 72 hours.  Neurologically intact Dg Lumbar Spine 2-3 Views  Result Date: 10/15/2017 CLINICAL DATA:  LEFT L5-S1 MICRODISCECTOMY EXAM: LUMBAR SPINE - 2-3 VIEW COMPARISON:  None. FINDINGS: Two cross-table lateral views are provided labeled 10:12 a.m. and 10:20 a.m. On the last image, surgical instruments are seen overlying the L5 posterior elements. Surgical probe is directed towards the L5-S1 disc space. IMPRESSION: Intraoperative images demonstrating surgical instruments overlying the L5 posterior elements. Electronically Signed   By: Franki Cabot M.D.   On: 10/15/2017 11:12    Assessment/Plan: 1 Day Post-Op Procedure(s) (LRB): LEFT L5-S1 MICRODISCECTOMY (N/A) Plan: discharge home. Office one week   [name] 10/16/2017, 9:00 AM

## 2017-10-22 NOTE — Discharge Summary (Signed)
Patient ID: Tonya Shelton MRN: [id number] DOB/AGE: 26-Jun-1956 61 y.o.  Admit date: 10/15/2017 Discharge date: 10/22/2017  Admission Diagnoses:  Active Problems:   Lumbar stenosis   Discharge Diagnoses:  Active Problems:   Lumbar stenosis  status post Procedure(s): LEFT L5-S1 MICRODISCECTOMY  Past Medical History:  Diagnosis Date  . Acid reflux   . Anemia    low iron  . Arthritis   . Chronic kidney disease    Stage 3 - Dr. Iona Beard in [place], [place]  . Depression   . Diabetes mellitus without complication ([facility])    type 2  . Family history of adverse reaction to anesthesia    mom and sister have n/v  . H/O bladder problems    leaks  . History of hiatal hernia   . Hypertension   . Hypothyroidism   . Neuropathy   . Osteoporosis   . PVC's (premature ventricular contractions)   . Restless legs   . Sleep apnea    uses cpap with oxygen  . Stroke [facility]) 2007   TIA    Surgeries: Procedure(s): LEFT L5-S1 MICRODISCECTOMY on 10/15/2017   Consultants:   Discharged Condition: Improved  Hospital Course: Tonya Shelton is an 61 y.o. female who was admitted 10/15/2017 for operative treatment of lumbar HNP. Patient failed conservative treatments (please see the history and physical for the specifics) and had severe unremitting pain that affects sleep, daily activities and work/hobbies. After pre-op clearance, the patient was taken to the operating room on 10/15/2017 and underwent  Procedure(s): LEFT L5-S1 MICRODISCECTOMY.    Patient was given perioperative antibiotics:  Anti-infectives (From admission, onward)   Start     Dose/Rate Route Frequency Ordered Stop   10/15/17 1800  ceFAZolin (ANCEF) IVPB 1 g/50 mL premix     1 g 100 mL/hr over 30 Minutes Intravenous Every 8 hours 10/15/17 1233 10/16/17 0316   10/15/17 0945  ceFAZolin (ANCEF) IVPB 2g/100 mL premix     2 g 200 mL/hr over 30 Minutes Intravenous On call to O.R. 10/15/17 0802 10/15/17 1010   10/15/17 0807   ceFAZolin (ANCEF) 2-4 GM/100ML-% IVPB    Comments:  Bobbie Stack   : cabinet override      10/15/17 0807 10/15/17 1010       Patient was given sequential compression devices and early ambulation to prevent DVT.   Patient benefited maximally from hospital stay and there were no complications. At the time of discharge, the patient was urinating/moving their bowels without difficulty, tolerating a regular diet, pain is controlled with oral pain medications and they have been cleared by PT/OT.   Recent vital signs: No data found.   Recent laboratory studies: No results for input(s): WBC, HGB, HCT, PLT, NA, K, CL, CO2, BUN, CREATININE, GLUCOSE, INR, CALCIUM in the last 72 hours.  Invalid input(s): PT, 2   Discharge Medications:   Allergies as of 10/16/2017      Reactions   Erythromycin Other (See Comments)   Eye ointment only:  Burned retinas   Povidone Iodine Other (See Comments)   betadine eye drop caused burning in eyes      Medication List    STOP taking these medications   acetaminophen-codeine 300-30 MG tablet Commonly known as:  TYLENOL #3   diazepam 5 MG tablet Commonly known as:  VALIUM     TAKE these medications   acetaminophen 500 MG tablet Commonly known as:  TYLENOL Take 1,000 mg by mouth daily as needed for moderate pain.  albuterol 108 (90 Base) MCG/ACT inhaler Commonly known as:  PROVENTIL HFA;VENTOLIN HFA Inhale 2 puffs into the lungs every 6 (six) hours as needed for wheezing or shortness of breath.   alendronate 70 MG tablet Commonly known as:  FOSAMAX Take 70 mg by mouth once a week. Sundays   ALIVE WOMENS 50+ PO Take 1 tablet by mouth daily.   aspirin 81 MG chewable tablet Chew 81 mg by mouth daily at 12 noon.   carvedilol 25 MG tablet Commonly known as:  COREG Take 25 mg by mouth 2 (two) times daily with a meal.   cevimeline 30 MG capsule Commonly known as:  EVOXAC TAKE 1 TABLET BY MOUTH TWICE DAILY   DULoxetine 60 MG capsule Commonly  known as:  CYMBALTA Take 60 mg by mouth daily.   ferrous sulfate 325 (65 FE) MG tablet take 1 tablet by mouth once daily with food   Fish Oil 1200 MG Caps Take 1,200 mg by mouth 2 (two) times daily.   furosemide 20 MG tablet Commonly known as:  LASIX Take 20 mg by mouth daily.   gabapentin 100 MG capsule Commonly known as:  NEURONTIN Take 200 mg by mouth at bedtime.   glimepiride 2 MG tablet Commonly known as:  AMARYL Take 2 mg by mouth daily with breakfast.   levothyroxine 88 MCG tablet Commonly known as:  SYNTHROID, LEVOTHROID Take 88 mcg by mouth daily before breakfast.   Magnesium Oxide 500 MG Caps Take 500 mg by mouth daily.   methocarbamol 500 MG tablet Commonly known as:  ROBAXIN Take 1 tablet (500 mg total) by mouth every 6 (six) hours as needed for muscle spasms.   nitroGLYCERIN 0.4 MG SL tablet Commonly known as:  NITROSTAT Place 0.4 mg under the tongue every 5 (five) minutes as needed for chest pain.   omeprazole 20 MG capsule Commonly known as:  PRILOSEC TAKE 2 TABLETS BY MOUTH EVERY DAILY   OSTEO BI-FLEX ADV TRIPLE ST PO Take 1 tablet by mouth at bedtime.   OVER THE COUNTER MEDICATION Place 2 tablets under the tongue at bedtime. Restless Leg   oxyCODONE-acetaminophen 5-325 MG tablet Commonly known as:  PERCOCET/ROXICET Take 1-2 tablets by mouth every 6 (six) hours as needed for severe pain.   pravastatin 40 MG tablet Commonly known as:  PRAVACHOL Take 40 mg by mouth at bedtime.   sitaGLIPtin 50 MG tablet Commonly known as:  JANUVIA Take 50 mg by mouth daily.   telmisartan 80 MG tablet Commonly known as:  MICARDIS TAKE 1 TABLET BY MOUTH DAILY AT NOON   tiZANidine 4 MG tablet Commonly known as:  ZANAFLEX Take 8 mg by mouth at bedtime.   TRADJENTA 5 MG Tabs tablet Generic drug:  linagliptin Take 5 mg by mouth daily.   Vitamin D (Ergocalciferol) 50000 units Caps capsule Commonly known as:  DRISDOL Take 50,000 Units by mouth. SUNDAYS        Diagnostic Studies: Dg Lumbar Spine 2-3 Views  Result Date: 10/15/2017 CLINICAL DATA:  LEFT L5-S1 MICRODISCECTOMY EXAM: LUMBAR SPINE - 2-3 VIEW COMPARISON:  None. FINDINGS: Two cross-table lateral views are provided labeled 10:12 a.m. and 10:20 a.m. On the last image, surgical instruments are seen overlying the L5 posterior elements. Surgical probe is directed towards the L5-S1 disc space. IMPRESSION: Intraoperative images demonstrating surgical instruments overlying the L5 posterior elements. Electronically Signed   By: Franki Cabot M.D.   On: 10/15/2017 11:12      Follow-up Information  Schedule an appointment as soon as possible for a visit with [name], MD.   Specialty:  Orthopedic Surgery Why:  need return office visit one week postop Contact information: [place] [address] [phone]           Discharge Plan:  discharge to home  Disposition:     Signed: Benjiman Core  10/22/2017, 11:21 AM

## 2017-10-28 ENCOUNTER — Ambulatory Visit (INDEPENDENT_AMBULATORY_CARE_PROVIDER_SITE_OTHER): Payer: BLUE CROSS/BLUE SHIELD | Admitting: Orthopaedic Surgery

## 2017-10-28 ENCOUNTER — Encounter (INDEPENDENT_AMBULATORY_CARE_PROVIDER_SITE_OTHER): Payer: Self-pay | Admitting: Orthopaedic Surgery

## 2017-10-28 VITALS — BP 108/67 | HR 77 | Ht 62.0 in | Wt 207.0 lb

## 2017-10-28 DIAGNOSIS — Z9889 Other specified postprocedural states: Secondary | ICD-10-CM

## 2017-10-28 NOTE — Progress Notes (Signed)
   Post-Op Visit Note   Patient: Tonya Shelton           Date of Birth: 1956/07/31           MRN: [id number] Visit Date: 10/28/2017 PCP: [name], FNP   Assessment & Plan: New walking program recheck 5 weeks.  Activities discussed.  Chief Complaint:  Chief Complaint  Patient presents with  . Lower Back - Routine Post Op   Visit Diagnoses:  1. Status post lumbar microdiscectomy       Left L5-S1 on 10/15/2017.  Plan: Days postop she is doing some walking and I encouraged her to do more walking.  Incision looks good Steri-Strips are off.  She can apply a Band-Aid so it does not rub against her closed.  She will try to increase her walking I will recheck her in 5 weeks she is off her narcotic medication.  She has some leg symptoms on the left as expected but improved pain in her leg and improved strength.  Follow-Up Instructions: No Follow-up on file.   Orders:  No orders of the defined types were placed in this encounter.  No orders of the defined types were placed in this encounter.   Imaging: No results found.  PMFS History: Patient Active Problem List   Diagnosis Date Noted  . Lumbar stenosis 10/15/2017  . Protrusion of lumbar intervertebral disc 05/20/2017   Past Medical History:  Diagnosis Date  . Acid reflux   . Anemia    low iron  . Arthritis   . Chronic kidney disease    Stage 3 - Dr. Iona Beard in [place], [place]  . Depression   . Diabetes mellitus without complication ([facility])    type 2  . Family history of adverse reaction to anesthesia    mom and sister have n/v  . H/O bladder problems    leaks  . History of hiatal hernia   . Hypertension   . Hypothyroidism   . Neuropathy   . Osteoporosis   . PVC's (premature ventricular contractions)   . Restless legs   . Sleep apnea    uses cpap with oxygen  . Stroke [facility]) 2007   TIA    Family History  Problem Relation Age of Onset  . Breast cancer Mother   . Endometriosis Mother   . Alcoholism  Father     Past Surgical History:  Procedure Laterality Date  . BUNIONECTOMY    . GALLBLADDER SURGERY    . LUMBAR LAMINECTOMY/DECOMPRESSION MICRODISCECTOMY N/A 10/15/2017   Procedure: LEFT L5-S1 MICRODISCECTOMY;  Surgeon: [name], MD;  Location: [facility];  Service: Orthopedics;  Laterality: N/A;  . NEUROMA SURGERY    . SPINAL FUSION  2003   cervical  . tonsillectomy     Social History   Occupational History  . Not on file  Tobacco Use  . Smoking status: Former Research scientist (life sciences)  . Smokeless tobacco: Never Used  Substance and Sexual Activity  . Alcohol use: No  . Drug use: No  . Sexual activity: Not on file

## 2017-12-02 ENCOUNTER — Ambulatory Visit (INDEPENDENT_AMBULATORY_CARE_PROVIDER_SITE_OTHER): Payer: BLUE CROSS/BLUE SHIELD | Admitting: Orthopaedic Surgery

## 2017-12-02 ENCOUNTER — Encounter (INDEPENDENT_AMBULATORY_CARE_PROVIDER_SITE_OTHER): Payer: Self-pay | Admitting: Orthopaedic Surgery

## 2017-12-02 VITALS — BP 106/64 | HR 67

## 2017-12-02 DIAGNOSIS — M5126 Other intervertebral disc displacement, lumbar region: Secondary | ICD-10-CM

## 2017-12-02 NOTE — Progress Notes (Signed)
   Post-Op Visit Note   Patient: Tonya Shelton           Date of Birth: 1956/02/02           MRN: [id number] Visit Date: 12/02/2017 PCP: [name], FNP   Assessment & Plan: Left L5-S1 microdiscectomy.  She still has a little bit aching in her back related to her arthritis.  She states she can walk much better than she could and is gotten good relief of her leg pain.  She states overall her back pain is maybe a 3 out of 10.  She is able to do many things that she could not do prior to the surgery.  Chief Complaint:  Chief Complaint  Patient presents with  . Lower Back - Routine Post Op, Follow-up   Visit Diagnoses:  1. Protrusion of lumbar intervertebral disc     Plan: Post microdiscectomy now 6 weeks out.  Normal activities including driving.  I encouraged her to work on a walking program so she can lose some weight.  Office follow-up as needed she is happy with the surgical result.  Follow-Up Instructions: Return if symptoms worsen or fail to improve.   Orders:  No orders of the defined types were placed in this encounter.  No orders of the defined types were placed in this encounter.   Imaging: No results found.  PMFS History: Patient Active Problem List   Diagnosis Date Noted  . Lumbar stenosis 10/15/2017  . Protrusion of lumbar intervertebral disc 05/20/2017   Past Medical History:  Diagnosis Date  . Acid reflux   . Anemia    low iron  . Arthritis   . Chronic kidney disease    Stage 3 - Dr. Iona Beard in [place], [place]  . Depression   . Diabetes mellitus without complication ([place])    type 2  . Family history of adverse reaction to anesthesia    mom and sister have n/v  . H/O bladder problems    leaks  . History of hiatal hernia   . Hypertension   . Hypothyroidism   . Neuropathy   . Osteoporosis   . PVC's (premature ventricular contractions)   . Restless legs   . Sleep apnea    uses cpap with oxygen  . Stroke [facility]) 2007   TIA    Family  History  Problem Relation Age of Onset  . Breast cancer Mother   . Endometriosis Mother   . Alcoholism Father     Past Surgical History:  Procedure Laterality Date  . BUNIONECTOMY    . GALLBLADDER SURGERY    . LUMBAR LAMINECTOMY/DECOMPRESSION MICRODISCECTOMY N/A 10/15/2017   Procedure: LEFT L5-S1 MICRODISCECTOMY;  Surgeon: [name], MD;  Location: [facility];  Service: Orthopedics;  Laterality: N/A;  . NEUROMA SURGERY    . SPINAL FUSION  2003   cervical  . tonsillectomy     Social History   Occupational History  . Not on file  Tobacco Use  . Smoking status: Former Research scientist (life sciences)  . Smokeless tobacco: Never Used  Substance and Sexual Activity  . Alcohol use: No  . Drug use: No  . Sexual activity: Not on file

## 2019-01-17 ENCOUNTER — Ambulatory Visit (INDEPENDENT_AMBULATORY_CARE_PROVIDER_SITE_OTHER): Payer: Medicare Other | Admitting: Internal Medicine

## 2019-01-17 ENCOUNTER — Encounter: Payer: Self-pay | Admitting: General Surgery

## 2019-01-17 ENCOUNTER — Other Ambulatory Visit: Payer: Self-pay

## 2019-01-17 ENCOUNTER — Encounter: Payer: Self-pay | Admitting: Internal Medicine

## 2019-01-17 VITALS — BP 130/66 | HR 66 | Temp 98.0°F | Ht 62.0 in | Wt 208.0 lb

## 2019-01-17 DIAGNOSIS — R911 Solitary pulmonary nodule: Secondary | ICD-10-CM | POA: Diagnosis not present

## 2019-01-17 DIAGNOSIS — R0609 Other forms of dyspnea: Secondary | ICD-10-CM

## 2019-01-17 DIAGNOSIS — R918 Other nonspecific abnormal finding of lung field: Secondary | ICD-10-CM | POA: Insufficient documentation

## 2019-01-17 NOTE — Patient Instructions (Signed)
Please see patient coordinator before you leave today  to schedule PET asap next available in am and I will see you on the same afternoon   Stop the vaping if at all possible in the meantime

## 2019-01-17 NOTE — Progress Notes (Signed)
Tonya Shelton, female    DOB: 1956-08-03, 63 y.o.   MRN: [id number]   Brief patient profile:  21 yowf quit smoking 2014 but still vaping  with OSA/Cpap 02 2lpm hs underwent LDSCT 3/2/20200 with finding of LUL mass 2.9 x 5 sm extending to L hilum so referred to pulmonary clinic 01/17/2019 by Tonya Shelton  Has h/o progressive wt gain since stopped smoking complicated by  hbp/ dm/ fibromyalgia/ Osteoporosis, RA, DJD, hypothryoid, GERD, OSA and CRI/neuropathy       History of Present Illness  01/17/2019  Pulmonary/ 1st office eval/Tonya Shelton  Chief Complaint  Patient presents with  . Pulmonary Consult    Referred by Tonya Shelton for eval of abnormal ct chest. The pt c/o SOB over the past 2 years.   Dyspnea:  Indolent onset / progressive sev years assoc with wt gain since stopped smoking  / MMRC3 = can't walk 100 yards even at a slow pace at a flat grade s stopping due to sob   Cough: none  Sleep: fine cpap / flat  SABA use: none   No obvious day to day or daytime variability or assoc excess/ purulent sputum or mucus plugs or hemoptysis or cp or chest tightness, subjective wheeze or overt sinus or hb symptoms.   Sleeping as above  without nocturnal  or early am exacerbation  of respiratory  c/o's or need for noct saba. Also denies any obvious fluctuation of symptoms with weather or environmental changes or other aggravating or alleviating factors except as outlined above   No unusual exposure hx or h/o childhood pna/ asthma or knowledge of premature birth.  Current Allergies, Complete Past Medical History, Past Surgical History, Family History, and Social History were reviewed in [employer] record.  ROS  The following are not active complaints unless bolded Hoarseness, sore throat, dysphagia, dental problems, itching, sneezing,  nasal congestion or discharge of excess mucus or purulent secretions, ear ache,   fever, chills, sweats, unintended wt loss or wt gain,  classically pleuritic or exertional cp,  orthopnea pnd or arm/hand swelling  or leg swelling, presyncope, palpitations, abdominal pain, anorexia, nausea, vomiting, diarrhea  or change in bowel habits or change in bladder habits, change in stools or change in urine, dysuria, hematuria,  rash, arthralgias, visual complaints, headache, numbness, weakness or ataxia or problems with walking or coordination,  change in mood or  memory.           Past Medical History:  Diagnosis Date  . Acid reflux   . Anemia    low iron  . Arthritis   . Chronic kidney disease    Stage 3 - Tonya Shelton in [place], [place]  . Depression   . Diabetes mellitus without complication ([name])    type 2  . Family history of adverse reaction to anesthesia    mom and sister have n/v  . H/O bladder problems    leaks  . History of hiatal hernia   . Hypertension   . Hypothyroidism   . Neuropathy   . Osteoporosis   . PVC's (premature ventricular contractions)   . Restless legs   . Sleep apnea    uses cpap with oxygen  . Stroke [facility]) 2007   TIA    Outpatient Medications Prior to Visit  Medication Sig Dispense Refill  . acetaminophen (TYLENOL) 500 MG tablet Take 1,000 mg by mouth daily as needed for moderate pain.    Marland Kitchen alendronate (FOSAMAX) 70 MG tablet Take 70  mg by mouth once a week. Sundays    . atorvastatin (LIPITOR) 40 MG tablet Take 40 mg by mouth daily.    . cevimeline (EVOXAC) 30 MG capsule TAKE 1 TABLET BY MOUTH TWICE DAILY    . DULoxetine (CYMBALTA) 60 MG capsule Take 60 mg by mouth daily.    . ferrous sulfate 325 (65 FE) MG tablet take 1 tablet by mouth once daily with food  0  . furosemide (LASIX) 20 MG tablet Take 20 mg by mouth daily.  0  . gabapentin (NEURONTIN) 100 MG capsule Take 200 mg by mouth at bedtime.     Marland Kitchen glimepiride (AMARYL) 2 MG tablet Take 2 mg by mouth daily with breakfast.   0  . levothyroxine (SYNTHROID, LEVOTHROID) 88 MCG tablet Take 88 mcg by mouth daily before breakfast.    . Misc  Natural Products (OSTEO BI-FLEX ADV TRIPLE ST PO) Take 1 tablet by mouth at bedtime.     . Multiple Vitamins-Minerals (ALIVE WOMENS 50+ PO) Take 1 tablet by mouth daily.    . nitroGLYCERIN (NITROSTAT) 0.4 MG SL tablet Place 0.4 mg under the tongue every 5 (five) minutes as needed for chest pain.    . Omega-3 Fatty Acids (FISH OIL) 1200 MG CAPS Take 1,200 mg by mouth 2 (two) times daily.     Marland Kitchen omeprazole (PRILOSEC) 20 MG capsule TAKE 2 TABLETS BY MOUTH EVERY DAILY    . oxybutynin (DITROPAN-XL) 10 MG 24 hr tablet Take 10 mg by mouth at bedtime.    Marland Kitchen telmisartan (MICARDIS) 80 MG tablet TAKE 1 TABLET BY MOUTH DAILY AT NOON    . tiZANidine (ZANAFLEX) 4 MG tablet Take 8 mg by mouth at bedtime.     . TRADJENTA 5 MG TABS tablet Take 5 mg by mouth daily.  0  . UNABLE TO FIND Med Name: CPAP with 2lpm o2 bled in  [place]    . Vitamin D, Ergocalciferol, (DRISDOL) 50000 units CAPS capsule Take 50,000 Units by mouth. SUNDAYS    . albuterol (PROVENTIL HFA;VENTOLIN HFA) 108 (90 Base) MCG/ACT inhaler Inhale 2 puffs into the lungs every 6 (six) hours as needed for wheezing or shortness of breath.    Marland Kitchen aspirin 81 MG chewable tablet Chew 81 mg by mouth daily at 12 noon.     Marland Kitchen atenolol (TENORMIN) 50 MG tablet Take by mouth.    . carvedilol (COREG) 25 MG tablet Take 25 mg by mouth 2 (two) times daily with a meal.    . hydrochlorothiazide (HYDRODIURIL) 25 MG tablet Take by mouth.    . Ibuprofen-Famotidine 800-26.6 MG TABS Take by mouth.    . Magnesium Oxide 500 MG CAPS Take 500 mg by mouth daily.    Marland Kitchen MAGNESIUM PO Take by mouth.    . meloxicam (MOBIC) 15 MG tablet Take by mouth.    . methocarbamol (ROBAXIN) 500 MG tablet Take 1 tablet (500 mg total) by mouth every 6 (six) hours as needed for muscle spasms. (Patient not taking: Reported on 12/02/2017) 60 tablet 0  . Milnacipran (SAVELLA) 50 MG TABS tablet 2 (two) times daily.    Marland Kitchen OVER THE COUNTER MEDICATION Place 2 tablets under the tongue at bedtime. Restless Leg     . oxyCODONE-acetaminophen (PERCOCET/ROXICET) 5-325 MG tablet Take 1-2 tablets by mouth every 6 (six) hours as needed for severe pain. (Patient not taking: Reported on 12/02/2017) 60 tablet 0  . potassium chloride SA (K-DUR,KLOR-CON) 20 MEQ tablet every morning.    . pravastatin (  PRAVACHOL) 40 MG tablet Take 40 mg by mouth at bedtime.    . sitaGLIPtin (JANUVIA) 50 MG tablet Take 50 mg by mouth daily.        Objective:     BP 130/66 (BP Location: Left Arm, Cuff Size: Normal)   Pulse 66   Temp 98 F (36.7 C) (Oral)   Ht 5\' 2"  (1.575 m)   Wt 208 lb (94.3 kg)   SpO2 96%   BMI 38.04 kg/m   SpO2: 96 % RA  Obese pleasant amb wf walks with 4 pronged cane/ nad   HEENT: nl dentition, turbinates bilaterally, and oropharynx. Nl external ear canals without cough reflex   NECK :  without JVD/Nodes/TM/ nl carotid upstrokes bilaterally   LUNGS: no acc muscle use,  Nl contour chest which is clear to A and P bilaterally without cough on insp or exp maneuvers   CV:  RRR  no s3 or murmur or increase in P2, and no edema   ABD:  Obese soft and nontender with nl inspiratory excursion in the supine position. No bruits or organomegaly appreciated, bowel sounds nl  MS:  ext warm without deformities, calf tenderness, cyanosis or clubbing No obvious joint restrictions   SKIN: warm and dry without lesions    NEURO:  alert, approp, nl sensorium with  no motor or cerebellar deficits apparent.    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   Discussed in detail all the  indications, usual  risks and alternatives  relative to the benefits with patient who agrees to proceed with w/u as outlined.        DOE (dyspnea on exertion) Quit smoking 2014 but still vaping  - 01/17/2019   Walked RA  2 laps @  approx 265ft each @ moderately slow pace with 4 pronged walker stopped due to end of study, sats 92% at end   - unable to do spirometry 01/17/2019 due to corona virus restrictions  I don't think she has much copd but the wt gain since she stopped smoking with deconditioning are the likely causes of her sob - would need full pfts if the pet shows resectable dz, which I doubt based on CT results, and I don't think she could tol pneumonectomy, which she likely would need, if by some chance it were resectable for cure.    Will try to expedite PET despite COVID- 19 concerns at her request but I told pt and husband I don't have any control over the restrictions in place.      Total time devoted to counseling  > 50 % of initial 60 min office visit:  review case with pt/husband/ daugher by phone/ directly  observed amb 02 study all of which extended face to face time for this visit/  discussion of options/alternatives/ personally creating written customized instructions  in presence of pt  then going over those specific  Instructions directly with the pt including how to use all of the meds but in particular covering each new medication in detail and the difference between the maintenance= "automatic" meds and the prns using an action plan format for the latter (If this problem/symptom => do that organization reading Left to right).  Please see AVS from this visit for a full list of these instructions which I personally wrote for this pt and  are unique to this visit.        Christinia Gully,  MD 01/17/2019

## 2019-01-18 ENCOUNTER — Encounter: Payer: Self-pay | Admitting: Internal Medicine

## 2019-01-18 DIAGNOSIS — R0609 Other forms of dyspnea: Secondary | ICD-10-CM | POA: Insufficient documentation

## 2019-01-18 NOTE — Assessment & Plan Note (Addendum)
Quit smoking 2014 but still vaping  - 01/17/2019   Walked RA  2 laps @  approx 284ft each @ moderately slow pace with 4 pronged walker stopped due to end of study, sats 92% at end   - unable to do spirometry 01/17/2019 due to corona virus restrictions  I don't think she has much copd but the wt gain since she stopped smoking with deconditioning are the likely causes of her sob - would need full pfts if the pet shows resectable dz, which I doubt based on CT results, and I don't think she could tol pneumonectomy, which she likely would need, if by some chance it were resectable for cure.    Will try to expedite PET despite COVID- 19 concerns at her request but I told pt and husband I don't have any control over the restrictions in place.    Total time devoted to counseling  > 50 % of initial 60 min office visit:  review case with pt/husband/ daugher by phone/ directly observed amb 02 study all of which extended face to face time for this visit/  discussion of options/alternatives/ personally creating written customized instructions  in presence of pt  then going over those specific  Instructions directly with the pt including how to use all of the meds but in particular covering each new medication in detail and the difference between the maintenance= "automatic" meds and the prns using an action plan format for the latter (If this problem/symptom => do that organization reading Left to right).  Please see AVS from this visit for a full list of these instructions which I personally wrote for this pt and  are unique to this visit.

## 2019-01-18 NOTE — Assessment & Plan Note (Signed)
CT chest = LDSCT 01/04/19 - 2.9 x 5 cm LUL mass abutting the major fissur and ext to L hilum with LH adenopathy  - PET ordered asap on 01/17/2019    I drew out the bronchial tree for the pt and husband and gave them a copy of what the ct is reporting showing as did not have the images  - I contrasted a peripheral spn, which I believe they understood she had vs what the scan is describing, which is probably a Stage IIII lung ca which will not prove resectable for cure and the best we can do for her is find the easiest way to provide a tissue dx >  PET next step either way.   Discussed in detail all the  indications, usual  risks and alternatives  relative to the benefits with patient who agrees to proceed with w/u as outlined.

## 2019-01-19 ENCOUNTER — Encounter (HOSPITAL_COMMUNITY)
Admission: RE | Admit: 2019-01-19 | Discharge: 2019-01-19 | Disposition: A | Discharge status: Home / Self Care | Payer: Medicare Other | Source: Ambulatory Visit | Attending: Internal Medicine | Admitting: Internal Medicine

## 2019-01-19 ENCOUNTER — Other Ambulatory Visit: Payer: Self-pay

## 2019-01-19 DIAGNOSIS — Z79899 Other long term (current) drug therapy: Secondary | ICD-10-CM | POA: Insufficient documentation

## 2019-01-19 DIAGNOSIS — R918 Other nonspecific abnormal finding of lung field: Secondary | ICD-10-CM | POA: Diagnosis not present

## 2019-01-19 DIAGNOSIS — R911 Solitary pulmonary nodule: Secondary | ICD-10-CM | POA: Diagnosis present

## 2019-01-19 LAB — GLUCOSE, CAPILLARY: Glucose-Capillary: 146 mg/dL — ABNORMAL HIGH (ref 70–99)

## 2019-01-19 MED ORDER — FLUDEOXYGLUCOSE F - 18 (FDG) INJECTION
10.4000 | Freq: Once | INTRAVENOUS | Status: AC
  Administered 2019-01-19: 10.4 via INTRAVENOUS

## 2019-01-20 ENCOUNTER — Telehealth: Payer: Self-pay | Admitting: Internal Medicine

## 2019-01-20 ENCOUNTER — Institutional Professional Consult (permissible substitution): Payer: BLUE CROSS/BLUE SHIELD | Admitting: Pulmonary Disease

## 2019-01-20 ENCOUNTER — Other Ambulatory Visit: Payer: Self-pay | Admitting: Internal Medicine

## 2019-01-20 DIAGNOSIS — R918 Other nonspecific abnormal finding of lung field: Secondary | ICD-10-CM

## 2019-01-20 NOTE — Progress Notes (Signed)
Verified with Dr Melvyn Novas- needing referral to [facility]  Referral was placed

## 2019-01-20 NOTE — Telephone Encounter (Signed)
Made [place] aware patient has results. She is to await a call with referral appt to T.surgeon. Nothing further needed.

## 2019-01-23 ENCOUNTER — Ambulatory Visit (INDEPENDENT_AMBULATORY_CARE_PROVIDER_SITE_OTHER): Payer: Medicare Other | Admitting: Pulmonary Disease

## 2019-01-23 ENCOUNTER — Other Ambulatory Visit: Payer: Self-pay

## 2019-01-23 ENCOUNTER — Encounter: Payer: Self-pay | Admitting: Pulmonary Disease

## 2019-01-23 DIAGNOSIS — R918 Other nonspecific abnormal finding of lung field: Secondary | ICD-10-CM | POA: Diagnosis not present

## 2019-01-23 DIAGNOSIS — Z87891 Personal history of nicotine dependence: Secondary | ICD-10-CM

## 2019-01-23 NOTE — Assessment & Plan Note (Addendum)
Assessment: Stop 2014 60-pack-year smoker Stop vaping 3 days ago  Plan: Continue to not smoke Continue to not vape Patient will need formal pulmonary function testing when we are able to complete in our office

## 2019-01-23 NOTE — Assessment & Plan Note (Addendum)
Assessment:  March/2020 PET scan shows a hypermetabolic solid 3.9 cm left upper lobe mass consistent with primary bronchogenic carcinoma, hypermetabolic left suprahilar hilar adenopathy 60-pack-year smoker Recently stop vaping 3 days ago  Plan: Patient already has referral placed to cardiothoracic surgery Dr. Roxan Hockey Pt needs PFTS, unable to complete at our office right now due to [place] precautions

## 2019-01-23 NOTE — Patient Instructions (Addendum)
Referral has already been placed to cardiothoracic surgery Dr. Roxan Hockey >>>their office will call you  Tentative follow-up with our office in 8 weeks (Dr. Melvyn Novas, if no availability then [place]), after follow-up with cardiothoracic surgery   CONGRATS ON [place]!      Coronavirus (COVID-19) Are you at risk?  Are you at risk for the Coronavirus (COVID-19)?  To be considered HIGH RISK for Coronavirus (COVID-19), you have to meet the following criteria:  . Traveled to [place], [place], [place], [place] or [place]; or in the [place] to [place], [place], [place], or [place]; and have fever, cough, and shortness of breath within the last 2 weeks of travel OR . Been in close contact with a person diagnosed with COVID-19 within the last 2 weeks and have fever, cough, and shortness of breath . IF YOU DO NOT MEET THESE CRITERIA, YOU ARE CONSIDERED LOW RISK FOR COVID-19.  What to do if you are HIGH RISK for COVID-19?  Marland Kitchen If you are having a medical emergency, call 911. . Seek medical care right away. Before you go to a doctor's office, urgent care or emergency department, call ahead and tell them about your recent travel, contact with someone diagnosed with COVID-19, and your symptoms. You should receive instructions from your physician's office regarding next steps of care.  . When you arrive at healthcare provider, tell the healthcare staff immediately you have returned from visiting [place], [place], [place], [place] or [place]; or traveled in the [place] to [place], [place], [place], or [place]; in the last two weeks or you have been in close contact with a person diagnosed with COVID-19 in the last 2 weeks.   . Tell the health care staff about your symptoms: fever, cough and shortness of breath. . After you have been seen by a medical provider, you will be either: o Tested for (COVID-19) and discharged home on quarantine except to seek medical care if  symptoms worsen, and asked to  - Stay home and avoid contact with others until you get your results (4-5 days)  - Avoid travel on public transportation if possible (such as bus, train, or airplane) or o Sent to the Emergency Department by EMS for evaluation, COVID-19 testing, and possible admission depending on your condition and test results.  What to do if you are LOW RISK for COVID-19?  Reduce your risk of any infection by using the same precautions used for avoiding the common cold or flu:  Marland Kitchen Wash your hands often with soap and warm water for at least 20 seconds.  If soap and water are not readily available, use an alcohol-based hand sanitizer with at least 60% alcohol.  . If coughing or sneezing, cover your mouth and nose by coughing or sneezing into the elbow areas of your shirt or coat, into a tissue or into your sleeve (not your hands). . Avoid shaking hands with others and consider head nods or verbal greetings only. . Avoid touching your eyes, nose, or mouth with unwashed hands.  . Avoid close contact with people who are sick. . Avoid places or events with large numbers of people in one location, like concerts or sporting events. . Carefully consider travel plans you have or are making. . If you are planning any travel outside or inside the [place], visit the CDC's Travelers' Health webpage for the latest health notices. . If you have some symptoms but not all symptoms, continue to monitor at  home and seek medical attention if your symptoms worsen. . If you are having a medical emergency, call 911.   [place] / e-Visit: [email or web address]         [facility]: [facility]: [phone]                   [facility]: [phone]           It is flu season:   >>> Best ways to protect herself from the flu: Receive the yearly  flu vaccine, practice good hand hygiene washing with soap and also using hand sanitizer when available, eat a nutritious meals, get adequate rest, hydrate appropriately   Please contact the office if your symptoms worsen or you have concerns that you are not improving.   Thank you for choosing [facility] for your healthcare, and for allowing [place] to partner with you on your healthcare journey. I am thankful to be able to provide care to you today.   Wyn Quaker FNP-C     Coping with Quitting Smoking  Quitting smoking is a physical and mental challenge. You will face cravings, withdrawal symptoms, and temptation. Before quitting, work with your health care provider to make a plan that can help you cope. Preparation can help you quit and keep you from giving in. How can I cope with cravings? Cravings usually last for 5-10 minutes. If you get through it, the craving will pass. Consider taking the following actions to help you cope with cravings:  Keep your mouth busy: ? Chew sugar-free gum. ? Suck on hard candies or a straw. ? Brush your teeth.  Keep your hands and body busy: ? Immediately change to a different activity when you feel a craving. ? Squeeze or play with a ball. ? Do an activity or a hobby, like making bead jewelry, practicing needlepoint, or working with wood. ? Mix up your normal routine. ? Take a short exercise break. Go for a quick walk or run up and down stairs. ? Spend time in public places where smoking is not allowed.  Focus on doing something kind or helpful for someone else.  Call a friend or family member to talk during a craving.  Join a support group.  Call a quit line, such as 1-800-QUIT-NOW.  Talk with your health care provider about medicines that might help you cope with cravings and make quitting easier for you. How can I deal with withdrawal symptoms? Your body may experience negative effects as it tries to get used to not having nicotine  in the system. These effects are called withdrawal symptoms. They may include:  Feeling hungrier than normal.  Trouble concentrating.  Irritability.  Trouble sleeping.  Feeling depressed.  Restlessness and agitation.  Craving a cigarette. To manage withdrawal symptoms:  Avoid places, people, and activities that trigger your cravings.  Remember why you want to quit.  Get plenty of sleep.  Avoid coffee and other caffeinated drinks. These may worsen some of your symptoms. How can I handle social situations? Social situations can be difficult when you are quitting smoking, especially in the first few weeks. To manage this, you can:  Avoid parties, bars, and other social situations where people might be smoking.  Avoid alcohol.  Leave right away if you have the urge to smoke.  Explain to your family and friends that you are quitting smoking. Ask for understanding and  support.  Plan activities with friends or family where smoking is not an option. What are some ways I can cope with stress? Wanting to smoke may cause stress, and stress can make you want to smoke. Find ways to manage your stress. Relaxation techniques can help. For example:  Breathe slowly and deeply, in through your nose and out through your mouth.  Listen to soothing, relaxing music.  Talk with a family member or friend about your stress.  Light a candle.  Soak in a bath or take a shower.  Think about a peaceful place. What are some ways I can prevent weight gain? Be aware that many people gain weight after they quit smoking. However, not everyone does. To keep from gaining weight, have a plan in place before you quit and stick to the plan after you quit. Your plan should include:  Having healthy snacks. When you have a craving, it may help to: ? Eat plain popcorn, crunchy carrots, celery, or other cut vegetables. ? Chew sugar-free gum.  Changing how you eat: ? Eat small portion sizes at  meals. ? Eat 4-6 small meals throughout the day instead of 1-2 large meals a day. ? Be mindful when you eat. Do not watch television or do other things that might distract you as you eat.  Exercising regularly: ? Make time to exercise each day. If you do not have time for a long workout, do short bouts of exercise for 5-10 minutes several times a day. ? Do some form of strengthening exercise, like weight lifting, and some form of aerobic exercise, like running or swimming.  Drinking plenty of water or other low-calorie or no-calorie drinks. Drink 6-8 glasses of water daily, or as much as instructed by your health care provider. Summary  Quitting smoking is a physical and mental challenge. You will face cravings, withdrawal symptoms, and temptation to smoke again. Preparation can help you as you go through these challenges.  You can cope with cravings by keeping your mouth busy (such as by chewing gum), keeping your body and hands busy, and making calls to family, friends, or a helpline for people who want to quit smoking.  You can cope with withdrawal symptoms by avoiding places where people smoke, avoiding drinks with caffeine, and getting plenty of rest.  Ask your health care provider about the different ways to prevent weight gain, avoid stress, and handle social situations. This information is not intended to replace advice given to you by your health care provider. Make sure you discuss any questions you have with your health care provider. Document Released: 10/09/2016 Document Revised: 10/09/2016 Document Reviewed: 10/09/2016 Elsevier Interactive Patient Education  2019 [place] Risks of Smoking Smoking cigarettes is very bad for your health. Tobacco smoke has over 200 known poisons in it. It contains the poisonous gases nitrogen oxide and carbon monoxide. There are over 60 chemicals in tobacco smoke that cause cancer. Smoking is difficult to quit because a  chemical in tobacco, called nicotine, causes addiction or dependence. When you smoke and inhale, nicotine is absorbed rapidly into the bloodstream through your lungs. Both inhaled and non-inhaled nicotine may be addictive. What are the risks of cigarette smoke? Cigarette smokers have an increased risk of many serious medical problems, including:  Lung cancer.  Lung disease, such as pneumonia, bronchitis, and emphysema.  Chest pain (angina) and heart attack because the heart is not getting enough oxygen.  Heart disease and peripheral blood  vessel disease.  High blood pressure (hypertension).  Stroke.  Oral cancer, including cancer of the lip, mouth, or voice box.  Bladder cancer.  Pancreatic cancer.  Cervical cancer.  Pregnancy complications, including premature birth.  Stillbirths and smaller newborn babies, birth defects, and genetic damage to sperm.  Early menopause.  Lower estrogen level for women.  Infertility.  Facial wrinkles.  Blindness.  Increased risk of broken bones (fractures).  Senile dementia.  Stomach ulcers and internal bleeding.  Delayed wound healing and increased risk of complications during surgery.  Even smoking lightly shortens your life expectancy by several years. Because of secondhand smoke exposure, children of smokers have an increased risk of the following:  Sudden infant death syndrome (SIDS).  Respiratory infections.  Lung cancer.  Heart disease.  Ear infections. What are the benefits of quitting? There are many health benefits of quitting smoking. Here are some of them:  Within days of quitting smoking, your risk of having a heart attack decreases, your blood flow improves, and your lung capacity improves. Blood pressure, pulse rate, and breathing patterns start returning to normal soon after quitting.  Within months, your lungs may clear up completely.  Quitting for 10 years reduces your risk of developing lung cancer and  heart disease to almost that of a nonsmoker.  People who quit may see an improvement in their overall quality of life. How do I quit smoking?     Smoking is an addiction with both physical and psychological effects, and longtime habits can be hard to change. Your health care provider can recommend:  Programs and community resources, which may include group support, education, or talk therapy.  Prescription medicines to help reduce cravings.  Nicotine replacement products, such as patches, gum, and nasal sprays. Use these products only as directed. Do not replace cigarette smoking with electronic cigarettes, which are commonly called e-cigarettes. The safety of e-cigarettes is not known, and some may contain harmful chemicals.  A combination of two or more of these methods. Where to find more information  American Lung Association: www.lung.org  American Cancer Society: www.cancer.org Summary  Smoking cigarettes is very bad for your health. Cigarette smokers have an increased risk of many serious medical problems, including several cancers, heart disease, and stroke.  Smoking is an addiction with both physical and psychological effects, and longtime habits can be hard to change.  By stopping right away, you can greatly reduce the risk of medical problems for you and your family.  To help you quit smoking, your health care provider can recommend programs, community resources, prescription medicines, and nicotine replacement products such as patches, gum, and nasal sprays. This information is not intended to replace advice given to you by your health care provider. Make sure you discuss any questions you have with your health care provider. Document Released: 11/19/2004 Document Revised: 01/13/2018 Document Reviewed: 10/16/2016 Elsevier Interactive Patient Education  2019 [employer].

## 2019-01-23 NOTE — Progress Notes (Signed)
Virtual Visit via Telephone Note  I connected with Tonya Shelton on 01/23/19 at 11:00 AM EDT by telephone and verified that I am speaking with the correct person using two identifiers.   I discussed the limitations, risks, security and privacy concerns of performing an evaluation and management service by telephone and the availability of in person appointments. I also discussed with the patient that there may be a patient responsible charge related to this service. The patient expressed understanding and agreed to proceed.   History of Present Illness:  Patient consented to consult via telephone: Yes  People present and their role in pt care: Pt  Chief complaint:  PET scan results / LUL Mass   63 year old female former smoker but still vaping (stopped smoking 2014 -60-pack-year smoker) followed in our office by Dr. Melvyn Novas.  Initially consulted with our practice on 01/17/2019 for evaluation of a left upper lobe mass 2.9 x 5 cm extending to left hilum.  Past medical history: High blood pressure, diabetes, fibromyalgia, osteoporosis, RA, degenerative joint disease, hypothyroidism, GERD, OSA.   Patient reports that Dr. Melvyn Novas has already called and talked with her about these results.  She does have concerns about the follow-up and the referral placed to cardiothoracic surgery.  I reassured the patient that the referral was placed on 01/20/2019.  I also explained to the patient that cardiothoracic surgery will contact her to get her scheduled.  Patient reports she has already stopped vaping she has not vape for the last 3 days.  I congratulated the patient on stopping vaping.  She denies increased shortness of breath, fatigue, weight loss, loss of appetite, hemoptysis.  She reports that she feels exact same since she saw Dr. Melvyn Novas with the exception of she has increased anxiety after the PET scan results.  I confirmed with the patient care coordinators our office that the referral was placed correctly and  that [facility] will contact the patient.  Observations/Objective:  01/19/2019-PET scan- hypermetabolic (max SUV 58.8) solid 3.9 cm posterior left upper lobe lung mass compatible with primary bronchogenic carcinoma, hypermetabolic left suprahilar hilar adenopathy, no hypermetabolic distant metastatic disease, PET CT stage IIb, additional non-hypermetabolic sub-solid and groundglass pulmonary nodules in both lungs, bilateral non-hypermetabolic adnexal cysts largest 4.5 cm on the right suggestion attenuation follow-up pelvic ultrasound in 3 to 6 months  Assessment and Plan:  Mass of left lung Assessment:  March/2020 PET scan shows a hypermetabolic solid 3.9 cm left upper lobe mass consistent with primary bronchogenic carcinoma, hypermetabolic left suprahilar hilar adenopathy 60-pack-year smoker Recently stop vaping 3 days ago  Plan: Patient already has referral placed to cardiothoracic surgery Dr. Roxan Hockey    Former smoker Assessment: Stop 2014 60-pack-year smoker Stop vaping 3 days ago  Plan: Continue to not smoke Continue to not vape  Follow Up Instructions:  Return in about 2 months (around 03/25/2019), or if symptoms worsen or fail to improve, for Follow up with Dr. Melvyn Novas, Follow up with Wyn Quaker FNP-C.  I discussed the assessment and treatment plan with the patient. The patient was provided an opportunity to ask questions and all were answered. The patient agreed with the plan and demonstrated an understanding of the instructions.   The patient was advised to call back or seek an in-person evaluation if the symptoms worsen or if the condition fails to improve as anticipated.  I provided 17 minutes of non-face-to-face time during this encounter.   Lauraine Rinne, NP

## 2019-01-25 NOTE — Progress Notes (Signed)
Chart and office note reviewed in detail  > agree with a/p as outlined    

## 2019-01-30 ENCOUNTER — Other Ambulatory Visit: Payer: Self-pay

## 2019-01-31 ENCOUNTER — Other Ambulatory Visit: Payer: Self-pay | Admitting: *Deleted

## 2019-01-31 ENCOUNTER — Encounter: Payer: Self-pay | Admitting: Thoracic Surgery (Cardiothoracic Vascular Surgery)

## 2019-01-31 ENCOUNTER — Institutional Professional Consult (permissible substitution): Payer: Medicare Other | Admitting: Thoracic Surgery (Cardiothoracic Vascular Surgery)

## 2019-01-31 VITALS — BP 121/69 | HR 66 | Temp 97.3°F | Resp 16 | Ht 62.0 in | Wt 207.0 lb

## 2019-01-31 DIAGNOSIS — E538 Deficiency of other specified B group vitamins: Secondary | ICD-10-CM | POA: Insufficient documentation

## 2019-01-31 DIAGNOSIS — R918 Other nonspecific abnormal finding of lung field: Secondary | ICD-10-CM | POA: Diagnosis not present

## 2019-01-31 DIAGNOSIS — R590 Localized enlarged lymph nodes: Secondary | ICD-10-CM

## 2019-01-31 NOTE — Progress Notes (Signed)
PCP is Plunk, Enid Derry, FNP Referring Provider is Tanda Rockers, MD  Chief Complaint  Patient presents with  . Lung Mass    Surgical eval for LUL , PET Scan 01/19/19, LDSCT CHEST 01/04/19  . Lung Lesion    BILATERAL    HPI: Tonya Shelton is sent for consultation regarding a left upper lobe lung mass.  Tonya Shelton is a 63 year old woman with a history of tobacco abuse, anemia, reflux, arthritis, chronic back pain, fibromyalgia, type II non-insulin-dependent diabetes mellitus without complication, hypertension, hypothyroidism, hyperlipidemia, obstructive sleep apnea, and TIA.  She has no known history of cardiac disease.  She says she has had a stress test about a year ago that was "okay."  She recently had an annual physical.  Because of her smoking history she was referred for low-dose lung cancer screening.  CT of the chest showed a left upper lobe mass with probable hilar adenopathy.  She was referred to Dr. Melvyn Novas.  He did a PET CT which showed the mass and hilar nodes were hypermetabolic.  There are multiple other smaller groundglass opacities that were not active on PET.  She has a 70-pack-year history of smoking.  She quit smoking about 5 years ago but then was vaping.  She quit that more recently.  She is relatively limited in activity due to chronic back pain and arthritis.  She denies cough, hemoptysis, and wheezing.  She denies any chest pain, pressure, tightness with exertion.  No change in appetite or weight loss, no headaches or visual changes.  She had an episode about 6 weeks ago when she got up to go the bathroom in the middle the night and passed out.  No further issues since then.  Zubrod Score: At the time of surgery this patient's most appropriate activity status/level should be described as: []     0    Normal activity, no symptoms [x]     1    Restricted in physical strenuous activity but ambulatory, able to do out light work []     2    Ambulatory and capable of self  care, unable to do work activities, up and about >50 % of waking hours                              []     3    Only limited self care, in bed greater than 50% of waking hours []     4    Completely disabled, no self care, confined to bed or chair []     5    Moribund  Past Medical History:  Diagnosis Date  . Acid reflux   . Anemia    low iron  . Arthritis   . Chronic kidney disease    Stage 3 - Dr. Iona Beard in [place], [place]  . Depression   . Diabetes mellitus without complication ([place])    type 2  . Family history of adverse reaction to anesthesia    mom and sister have n/v  . H/O bladder problems    leaks  . History of hiatal hernia   . Hypertension   . Hypothyroidism   . Neuropathy   . Osteoporosis   . PVC's (premature ventricular contractions)   . Restless legs   . Sleep apnea    uses cpap with oxygen  . Stroke [facility]) 2007   TIA    Past Surgical History:  Procedure Laterality Date  . BUNIONECTOMY    .  GALLBLADDER SURGERY    . LUMBAR LAMINECTOMY/DECOMPRESSION MICRODISCECTOMY N/A 10/15/2017   Procedure: LEFT L5-S1 MICRODISCECTOMY;  Surgeon: [name], MD;  Location: [facility];  Service: Orthopedics;  Laterality: N/A;  . NEUROMA SURGERY    . SPINAL FUSION  2003   cervical  . tonsillectomy      Family History  Problem Relation Age of Onset  . Breast cancer Mother   . Endometriosis Mother   . Alcoholism Father   . Lung cancer Neg Hx     Social History Social History   Tobacco Use  . Smoking status: Former Smoker    Packs/day: 1.50    Years: 40.00    Pack years: 60.00    Types: Cigarettes    Last attempt to quit: 10/26/2012    Years since quitting: 6.2  . Smokeless tobacco: Never Used  Substance Use Topics  . Alcohol use: No  . Drug use: No    Current Outpatient Medications  Medication Sig Dispense Refill  . acetaminophen (TYLENOL) 500 MG tablet Take 1,000 mg by mouth daily as needed for moderate pain.    Marland Kitchen albuterol (PROVENTIL HFA;VENTOLIN HFA) 108  (90 Base) MCG/ACT inhaler Inhale 2 puffs into the lungs every 6 (six) hours as needed for wheezing or shortness of breath.    Marland Kitchen alendronate (FOSAMAX) 70 MG tablet Take 70 mg by mouth once a week. Sundays    . aspirin 81 MG chewable tablet Chew 81 mg by mouth daily at 12 noon.     Marland Kitchen atorvastatin (LIPITOR) 40 MG tablet Take 40 mg by mouth daily.    . carvedilol (COREG) 25 MG tablet Take 25 mg by mouth 2 (two) times daily with a meal.    . cevimeline (EVOXAC) 30 MG capsule TAKE 1 TABLET BY MOUTH TWICE DAILY    . DULoxetine (CYMBALTA) 60 MG capsule Take 60 mg by mouth daily.    . ferrous sulfate 325 (65 FE) MG tablet take 1 tablet by mouth once daily with food  0  . furosemide (LASIX) 20 MG tablet Take 20 mg by mouth daily.  0  . gabapentin (NEURONTIN) 100 MG capsule Take 200 mg by mouth at bedtime.     Marland Kitchen glimepiride (AMARYL) 2 MG tablet Take 2 mg by mouth daily with breakfast.   0  . levothyroxine (SYNTHROID, LEVOTHROID) 88 MCG tablet Take 88 mcg by mouth daily before breakfast.    . Misc Natural Products (OSTEO BI-FLEX ADV TRIPLE ST PO) Take 1 tablet by mouth at bedtime.     . Multiple Vitamins-Minerals (ALIVE WOMENS 50+ PO) Take 1 tablet by mouth daily.    . nitroGLYCERIN (NITROSTAT) 0.4 MG SL tablet Place 0.4 mg under the tongue every 5 (five) minutes as needed for chest pain.    . Omega-3 Fatty Acids (FISH OIL) 1200 MG CAPS Take 1,200 mg by mouth 2 (two) times daily.     Marland Kitchen omeprazole (PRILOSEC) 20 MG capsule TAKE 2 TABLETS BY MOUTH EVERY DAILY    . oxybutynin (DITROPAN-XL) 10 MG 24 hr tablet Take 10 mg by mouth at bedtime.    Marland Kitchen telmisartan (MICARDIS) 80 MG tablet TAKE 1 TABLET BY MOUTH DAILY AT NOON    . tiZANidine (ZANAFLEX) 4 MG tablet Take 8 mg by mouth at bedtime.     . TRADJENTA 5 MG TABS tablet Take 5 mg by mouth daily.  0  . UNABLE TO FIND Med Name: CPAP with 2lpm o2 bled in  [place]    .  Vitamin D, Ergocalciferol, (DRISDOL) 50000 units CAPS capsule Take 50,000 Units by mouth. SUNDAYS      No current facility-administered medications for this visit.     Allergies  Allergen Reactions  . Erythromycin Other (See Comments)    Eye ointment only:  Burned retinas  . Povidone Iodine Other (See Comments)    betadine eye drop caused burning in eyes    Review of Systems  Constitutional: Negative for activity change, appetite change and unexpected weight change.  HENT: Positive for dental problem (dentures). Negative for trouble swallowing and voice change.   Eyes: Negative for visual disturbance.  Respiratory: Positive for apnea (CpAP + O2) and shortness of breath. Negative for cough, chest tightness and wheezing.   Cardiovascular: Positive for palpitations (PVCs). Negative for chest pain and leg swelling.  Gastrointestinal: Positive for abdominal pain (reflux).  Endocrine: Negative for polydipsia and polyphagia.  Genitourinary: Negative for difficulty urinating and dysuria.       Stage III CKD  Musculoskeletal: Positive for arthralgias, back pain and myalgias.  Neurological: Positive for syncope (vasovagal about 6 weeks ago).  Hematological: Negative for adenopathy. Does not bruise/bleed easily.  Psychiatric/Behavioral: The patient is nervous/anxious.   All other systems reviewed and are negative.   BP 121/69 (BP Location: Right Arm, Patient Position: Sitting, Cuff Size: Large)   Pulse 66   Temp (!) 97.3 F (36.3 C) (Oral)   Resp 16   Ht 5\' 2"  (1.575 m)   Wt 207 lb (93.9 kg)   SpO2 96% Comment: RA  BMI 37.86 kg/m  Physical Exam Vitals signs reviewed.  Constitutional:      General: She is not in acute distress.    Appearance: She is obese.  HENT:     Head: Normocephalic and atraumatic.  Eyes:     General: No scleral icterus.    Extraocular Movements: Extraocular movements intact.     Conjunctiva/sclera: Conjunctivae normal.     Pupils: Pupils are equal, round, and reactive to light.  Neck:     Musculoskeletal: Neck supple.  Cardiovascular:     Rate and  Rhythm: Normal rate and regular rhythm.     Pulses: Normal pulses.     Heart sounds: Normal heart sounds. No murmur. No friction rub. No gallop.   Pulmonary:     Effort: Pulmonary effort is normal. No respiratory distress.     Breath sounds: Normal breath sounds. No wheezing or rales.  Abdominal:     General: There is no distension.     Palpations: Abdomen is soft.     Tenderness: There is no abdominal tenderness.  Musculoskeletal:        General: No swelling.  Lymphadenopathy:     Cervical: No cervical adenopathy.  Skin:    General: Skin is warm and dry.  Neurological:     General: No focal deficit present.     Mental Status: She is alert and oriented to person, place, and time.     Cranial Nerves: No cranial nerve deficit.     Motor: No weakness.     Coordination: Coordination normal.    Diagnostic Tests: NUCLEAR MEDICINE PET SKULL BASE TO THIGH  TECHNIQUE: 10.4 mCi F-18 FDG was injected intravenously. Full-ring PET imaging was performed from the skull base to thigh after the radiotracer. CT data was obtained and used for attenuation correction and anatomic localization.  Fasting blood glucose: 146 mg/dl  COMPARISON:  None.  FINDINGS: Mediastinal blood pool activity: SUV max 3.5  NECK: No hypermetabolic  lymph nodes in the neck.  Incidental CT findings: none  CHEST:  Hypermetabolic solid 3.8 x 3.7 cm posterior left upper lobe lung mass (series 8/image 17) with max SUV 12.9.  Hypermetabolic 2.3 cm left suprahilar node with max SUV 11.2 (series 4/image 60).  No additional hypermetabolic pulmonary findings. No hypermetabolic axillary, mediastinal or right hilar nodes.  Incidental CT findings: Subsolid 1.9 cm peripheral apical left upper lobe pulmonary nodule (series 8/image 12) is non hypermetabolic. A few small ground-glass pulmonary nodules scattered in the right lung, largest 1.3 cm in the posterior right upper lobe (series 8/image 15). Coronary  atherosclerosis. Atherosclerotic nonaneurysmal thoracic aorta.  ABDOMEN/PELVIS: No abnormal hypermetabolic activity within the liver, pancreas, adrenal glands, or spleen. No hypermetabolic lymph nodes in the abdomen or pelvis.  Incidental CT findings: Cholecystectomy. Tiny granulomatous left liver lobe calcifications. Minimal sigmoid diverticulosis. Bilateral adnexal cysts measuring 4.5 cm on the right (series 4/image 161) and 2.9 cm on the left (series 4/image 162). Atherosclerotic nonaneurysmal abdominal aorta.  SKELETON: No focal hypermetabolic activity to suggest skeletal metastasis.  Incidental CT findings: none  IMPRESSION: 1. Hypermetabolic (max SUV 31.5) solid 3.8 cm posterior left upper lobe lung mass, compatible with primary bronchogenic carcinoma. 2. Hypermetabolic left suprahilar adenopathy. 3. No hypermetabolic distant metastatic disease. 4. PET-CT stage IIB (T2a N1 M0). 5. Additional non hypermetabolic subsolid and ground-glass pulmonary nodules in both lungs, for which attention on follow-up chest CT is advised. 6. Bilateral non hypermetabolic adnexal cysts, largest 4.5 cm on the right. Suggest attention on follow-up pelvic ultrasound in 3-6 months. 7.  Aortic Atherosclerosis (ICD10-I70.0).   Electronically Signed   By: Ilona Sorrel M.D.   On: 01/19/2019 11:45 I personally reviewed the PET/CT images and concur with the findings noted above  Impression: Tonya Shelton is a 63 year old former smoker with past history significant for anemia, reflux, arthritis, chronic back pain, fibromyalgia, type II non-insulin-dependent diabetes mellitus without complication, hypertension, hypothyroidism, hyperlipidemia, obstructive sleep apnea, and TIA.  She recently had a CT of the chest for lung cancer screening.  It showed a left upper lobe mass with hilar adenopathy.  On PET CT there is a 3.8 cm mass that is hypermetabolic with an SUV of 17.6.  There also is  hypermetabolic hilar adenopathy.  There was no evidence of distant metastasis.  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  Small cell carcinoma is also in the differential, but less likely based on appearance.  The first step is to establish a diagnosis.  I think the best way to do this would be with bronchoscopy and endobronchial ultrasound.  That will help [place] assess whether this is resectable.  Neither the CT or the CT component of the PET/CT are the highest quality.  There is some concern that this might not be resectable with a lobectomy, although I think it probably can.  We may have difficulty obtaining pulmonary function testing in the current climate with COVID, but we need to obtain those if possible.  We will do a 6-minute walk test while she is in the office today.  We did discuss briefly potential treatment which could include some combination of surgery, chemotherapy, and/or radiation.  Coronary calcification seen on CT and thoracic aortic atherosclerosis-asymptomatic.  She is on a statin.  Hypertension-blood pressure well controlled on current regimen.  Type II non-insulin-dependent diabetes mellitus-she monitors at home.  No recent issues.  Plan: 6-minute walk test Pulmonary function testing with and without bronchodilators Bronchoscopy with  radial ultrasound and endobronchial ultrasound on Thursday, 02/09/2019.  [name], MD [facility] [phone]

## 2019-01-31 NOTE — Progress Notes (Signed)
6 MINUTE WALK TEST WAS PERFORMED.  Tonya Shelton usually uses a cane, which she didn't have today,  when walking long distances due to back pain.  She walked at s slow pace experiencing some shortness of breath and moderate low back pain at 3/4 of the test.  PRE TESTING BP was 121/69 P 66 O2 SAT 96%...POST OP TESTING BP 141/67 P 99 O2 SAT 92%.  She walked 784 feet with no stops and on RA.

## 2019-01-31 NOTE — H&P (View-Only) (Signed)
PCP is Plunk, Enid Derry, FNP Referring Provider is Tanda Rockers, MD  Chief Complaint  Patient presents with  . Lung Mass    Surgical eval for LUL , PET Scan 01/19/19, LDSCT CHEST 01/04/19  . Lung Lesion    BILATERAL    HPI: Tonya Shelton is sent for consultation regarding a left upper lobe lung mass.  Tonya Shelton is a 63 year old woman with a history of tobacco abuse, anemia, reflux, arthritis, chronic back pain, fibromyalgia, type II non-insulin-dependent diabetes mellitus without complication, hypertension, hypothyroidism, hyperlipidemia, obstructive sleep apnea, and TIA.  She has no known history of cardiac disease.  She says she has had a stress test about a year ago that was "okay."  She recently had an annual physical.  Because of her smoking history she was referred for low-dose lung cancer screening.  CT of the chest showed a left upper lobe mass with probable hilar adenopathy.  She was referred to Dr. Melvyn Novas.  He did a PET CT which showed the mass and hilar nodes were hypermetabolic.  There are multiple other smaller groundglass opacities that were not active on PET.  She has a 70-pack-year history of smoking.  She quit smoking about 5 years ago but then was vaping.  She quit that more recently.  She is relatively limited in activity due to chronic back pain and arthritis.  She denies cough, hemoptysis, and wheezing.  She denies any chest pain, pressure, tightness with exertion.  No change in appetite or weight loss, no headaches or visual changes.  She had an episode about 6 weeks ago when she got up to go the bathroom in the middle the night and passed out.  No further issues since then.  Zubrod Score: At the time of surgery this patient's most appropriate activity status/level should be described as: []     0    Normal activity, no symptoms [x]     1    Restricted in physical strenuous activity but ambulatory, able to do out light work []     2    Ambulatory and capable of self  care, unable to do work activities, up and about >50 % of waking hours                              []     3    Only limited self care, in bed greater than 50% of waking hours []     4    Completely disabled, no self care, confined to bed or chair []     5    Moribund  Past Medical History:  Diagnosis Date  . Acid reflux   . Anemia    low iron  . Arthritis   . Chronic kidney disease    Stage 3 - Dr. Iona Beard in [place], [place]  . Depression   . Diabetes mellitus without complication ([place])    type 2  . Family history of adverse reaction to anesthesia    mom and sister have n/v  . H/O bladder problems    leaks  . History of hiatal hernia   . Hypertension   . Hypothyroidism   . Neuropathy   . Osteoporosis   . PVC's (premature ventricular contractions)   . Restless legs   . Sleep apnea    uses cpap with oxygen  . Stroke [facility]) 2007   TIA    Past Surgical History:  Procedure Laterality Date  . BUNIONECTOMY    .  GALLBLADDER SURGERY    . LUMBAR LAMINECTOMY/DECOMPRESSION MICRODISCECTOMY N/A 10/15/2017   Procedure: LEFT L5-S1 MICRODISCECTOMY;  Surgeon: [name], MD;  Location: [place];  Service: Orthopedics;  Laterality: N/A;  . NEUROMA SURGERY    . SPINAL FUSION  2003   cervical  . tonsillectomy      Family History  Problem Relation Age of Onset  . Breast cancer Mother   . Endometriosis Mother   . Alcoholism Father   . Lung cancer Neg Hx     Social History Social History   Tobacco Use  . Smoking status: Former Smoker    Packs/day: 1.50    Years: 40.00    Pack years: 60.00    Types: Cigarettes    Last attempt to quit: 10/26/2012    Years since quitting: 6.2  . Smokeless tobacco: Never Used  Substance Use Topics  . Alcohol use: No  . Drug use: No    Current Outpatient Medications  Medication Sig Dispense Refill  . acetaminophen (TYLENOL) 500 MG tablet Take 1,000 mg by mouth daily as needed for moderate pain.    Marland Kitchen albuterol (PROVENTIL HFA;VENTOLIN HFA) 108  (90 Base) MCG/ACT inhaler Inhale 2 puffs into the lungs every 6 (six) hours as needed for wheezing or shortness of breath.    Marland Kitchen alendronate (FOSAMAX) 70 MG tablet Take 70 mg by mouth once a week. Sundays    . aspirin 81 MG chewable tablet Chew 81 mg by mouth daily at 12 noon.     Marland Kitchen atorvastatin (LIPITOR) 40 MG tablet Take 40 mg by mouth daily.    . carvedilol (COREG) 25 MG tablet Take 25 mg by mouth 2 (two) times daily with a meal.    . cevimeline (EVOXAC) 30 MG capsule TAKE 1 TABLET BY MOUTH TWICE DAILY    . DULoxetine (CYMBALTA) 60 MG capsule Take 60 mg by mouth daily.    . ferrous sulfate 325 (65 FE) MG tablet take 1 tablet by mouth once daily with food  0  . furosemide (LASIX) 20 MG tablet Take 20 mg by mouth daily.  0  . gabapentin (NEURONTIN) 100 MG capsule Take 200 mg by mouth at bedtime.     Marland Kitchen glimepiride (AMARYL) 2 MG tablet Take 2 mg by mouth daily with breakfast.   0  . levothyroxine (SYNTHROID, LEVOTHROID) 88 MCG tablet Take 88 mcg by mouth daily before breakfast.    . Misc Natural Products (OSTEO BI-FLEX ADV TRIPLE ST PO) Take 1 tablet by mouth at bedtime.     . Multiple Vitamins-Minerals (ALIVE WOMENS 50+ PO) Take 1 tablet by mouth daily.    . nitroGLYCERIN (NITROSTAT) 0.4 MG SL tablet Place 0.4 mg under the tongue every 5 (five) minutes as needed for chest pain.    . Omega-3 Fatty Acids (FISH OIL) 1200 MG CAPS Take 1,200 mg by mouth 2 (two) times daily.     Marland Kitchen omeprazole (PRILOSEC) 20 MG capsule TAKE 2 TABLETS BY MOUTH EVERY DAILY    . oxybutynin (DITROPAN-XL) 10 MG 24 hr tablet Take 10 mg by mouth at bedtime.    Marland Kitchen telmisartan (MICARDIS) 80 MG tablet TAKE 1 TABLET BY MOUTH DAILY AT NOON    . tiZANidine (ZANAFLEX) 4 MG tablet Take 8 mg by mouth at bedtime.     . TRADJENTA 5 MG TABS tablet Take 5 mg by mouth daily.  0  . UNABLE TO FIND Med Name: CPAP with 2lpm o2 bled in  [place]    .  Vitamin D, Ergocalciferol, (DRISDOL) 50000 units CAPS capsule Take 50,000 Units by mouth. SUNDAYS      No current facility-administered medications for this visit.     Allergies  Allergen Reactions  . Erythromycin Other (See Comments)    Eye ointment only:  Burned retinas  . Povidone Iodine Other (See Comments)    betadine eye drop caused burning in eyes    Review of Systems  Constitutional: Negative for activity change, appetite change and unexpected weight change.  HENT: Positive for dental problem (dentures). Negative for trouble swallowing and voice change.   Eyes: Negative for visual disturbance.  Respiratory: Positive for apnea (CpAP + O2) and shortness of breath. Negative for cough, chest tightness and wheezing.   Cardiovascular: Positive for palpitations (PVCs). Negative for chest pain and leg swelling.  Gastrointestinal: Positive for abdominal pain (reflux).  Endocrine: Negative for polydipsia and polyphagia.  Genitourinary: Negative for difficulty urinating and dysuria.       Stage III CKD  Musculoskeletal: Positive for arthralgias, back pain and myalgias.  Neurological: Positive for syncope (vasovagal about 6 weeks ago).  Hematological: Negative for adenopathy. Does not bruise/bleed easily.  Psychiatric/Behavioral: The patient is nervous/anxious.   All other systems reviewed and are negative.   BP 121/69 (BP Location: Right Arm, Patient Position: Sitting, Cuff Size: Large)   Pulse 66   Temp (!) 97.3 F (36.3 C) (Oral)   Resp 16   Ht 5\' 2"  (1.575 m)   Wt 207 lb (93.9 kg)   SpO2 96% Comment: RA  BMI 37.86 kg/m  Physical Exam Vitals signs reviewed.  Constitutional:      General: She is not in acute distress.    Appearance: She is obese.  HENT:     Head: Normocephalic and atraumatic.  Eyes:     General: No scleral icterus.    Extraocular Movements: Extraocular movements intact.     Conjunctiva/sclera: Conjunctivae normal.     Pupils: Pupils are equal, round, and reactive to light.  Neck:     Musculoskeletal: Neck supple.  Cardiovascular:     Rate and  Rhythm: Normal rate and regular rhythm.     Pulses: Normal pulses.     Heart sounds: Normal heart sounds. No murmur. No friction rub. No gallop.   Pulmonary:     Effort: Pulmonary effort is normal. No respiratory distress.     Breath sounds: Normal breath sounds. No wheezing or rales.  Abdominal:     General: There is no distension.     Palpations: Abdomen is soft.     Tenderness: There is no abdominal tenderness.  Musculoskeletal:        General: No swelling.  Lymphadenopathy:     Cervical: No cervical adenopathy.  Skin:    General: Skin is warm and dry.  Neurological:     General: No focal deficit present.     Mental Status: She is alert and oriented to person, place, and time.     Cranial Nerves: No cranial nerve deficit.     Motor: No weakness.     Coordination: Coordination normal.    Diagnostic Tests: NUCLEAR MEDICINE PET SKULL BASE TO THIGH  TECHNIQUE: 10.4 mCi F-18 FDG was injected intravenously. Full-ring PET imaging was performed from the skull base to thigh after the radiotracer. CT data was obtained and used for attenuation correction and anatomic localization.  Fasting blood glucose: 146 mg/dl  COMPARISON:  None.  FINDINGS: Mediastinal blood pool activity: SUV max 3.5  NECK: No hypermetabolic  lymph nodes in the neck.  Incidental CT findings: none  CHEST:  Hypermetabolic solid 3.8 x 3.7 cm posterior left upper lobe lung mass (series 8/image 17) with max SUV 12.9.  Hypermetabolic 2.3 cm left suprahilar node with max SUV 11.2 (series 4/image 60).  No additional hypermetabolic pulmonary findings. No hypermetabolic axillary, mediastinal or right hilar nodes.  Incidental CT findings: Subsolid 1.9 cm peripheral apical left upper lobe pulmonary nodule (series 8/image 12) is non hypermetabolic. A few small ground-glass pulmonary nodules scattered in the right lung, largest 1.3 cm in the posterior right upper lobe (series 8/image 15). Coronary  atherosclerosis. Atherosclerotic nonaneurysmal thoracic aorta.  ABDOMEN/PELVIS: No abnormal hypermetabolic activity within the liver, pancreas, adrenal glands, or spleen. No hypermetabolic lymph nodes in the abdomen or pelvis.  Incidental CT findings: Cholecystectomy. Tiny granulomatous left liver lobe calcifications. Minimal sigmoid diverticulosis. Bilateral adnexal cysts measuring 4.5 cm on the right (series 4/image 161) and 2.9 cm on the left (series 4/image 162). Atherosclerotic nonaneurysmal abdominal aorta.  SKELETON: No focal hypermetabolic activity to suggest skeletal metastasis.  Incidental CT findings: none  IMPRESSION: 1. Hypermetabolic (max SUV 16.1) solid 3.8 cm posterior left upper lobe lung mass, compatible with primary bronchogenic carcinoma. 2. Hypermetabolic left suprahilar adenopathy. 3. No hypermetabolic distant metastatic disease. 4. PET-CT stage IIB (T2a N1 M0). 5. Additional non hypermetabolic subsolid and ground-glass pulmonary nodules in both lungs, for which attention on follow-up chest CT is advised. 6. Bilateral non hypermetabolic adnexal cysts, largest 4.5 cm on the right. Suggest attention on follow-up pelvic ultrasound in 3-6 months. 7.  Aortic Atherosclerosis (ICD10-I70.0).   Electronically Signed   By: Ilona Sorrel M.D.   On: 01/19/2019 11:45 I personally reviewed the PET/CT images and concur with the findings noted above  Impression: Tonya Shelton is a 63 year old former smoker with past history significant for anemia, reflux, arthritis, chronic back pain, fibromyalgia, type II non-insulin-dependent diabetes mellitus without complication, hypertension, hypothyroidism, hyperlipidemia, obstructive sleep apnea, and TIA.  She recently had a CT of the chest for lung cancer screening.  It showed a left upper lobe mass with hilar adenopathy.  On PET CT there is a 3.8 cm mass that is hypermetabolic with an SUV of 09.6.  There also is  hypermetabolic hilar adenopathy.  There was no evidence of distant metastasis.  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  Small cell carcinoma is also in the differential, but less likely based on appearance.  The first step is to establish a diagnosis.  I think the best way to do this would be with bronchoscopy and endobronchial ultrasound.  That will help [place] assess whether this is resectable.  Neither the CT or the CT component of the PET/CT are the highest quality.  There is some concern that this might not be resectable with a lobectomy, although I think it probably can.  We may have difficulty obtaining pulmonary function testing in the current climate with COVID, but we need to obtain those if possible.  We will do a 6-minute walk test while she is in the office today.  We did discuss briefly potential treatment which could include some combination of surgery, chemotherapy, and/or radiation.  Coronary calcification seen on CT and thoracic aortic atherosclerosis-asymptomatic.  She is on a statin.  Hypertension-blood pressure well controlled on current regimen.  Type II non-insulin-dependent diabetes mellitus-she monitors at home.  No recent issues.  Plan: 6-minute walk test Pulmonary function testing with and without bronchodilators Bronchoscopy with  radial ultrasound and endobronchial ultrasound on Thursday, 02/09/2019.  [name], MD [facility] [phone]

## 2019-02-01 ENCOUNTER — Encounter: Payer: Self-pay | Admitting: Thoracic Surgery (Cardiothoracic Vascular Surgery)

## 2019-02-01 ENCOUNTER — Encounter: Payer: Medicare Other | Admitting: Thoracic Surgery (Cardiothoracic Vascular Surgery)

## 2019-02-02 NOTE — Pre-Procedure Instructions (Signed)
Tonya Shelton  02/02/2019      [id number] Tonya Shelton, [place] [address] [address] Phone: [phone] Fax: [phone]    Your procedure is scheduled on Thurs., February 09, 2019 from 9:45AM-10:30AM  Report to [facility] Entrance "A" at 7:45AM  Call this number if you have problems the morning of surgery:  [phone]   Remember:  Do not eat or drink after midnight on April 15th    Take these medicines the morning of surgery with A SIP OF WATER: Carvedilol (COREG), DULoxetine (CYMBALTA), Levothyroxine (SYNTHROID, LEVOTHROID), Omeprazole (PRILOSEC), and Oxybutynin (DITROPAN-XL)  If needed: Acetaminophen (TYLENOL) and NitroGLYCERIN (NITROSTAT)   Follow your surgeon's instructions on when to stop Aspirin.  If no instructions were given by your surgeon then you will need to call the office to get those instructions.    As of today, stop taking all Other Aspirin Products, Vitamins, Fish oils, and Herbal medications. Also stop all NSAIDS i.e. Advil, Ibuprofen, Motrin, Aleve, Anaprox, Naproxen, BC, Goody Powders, and all Supplements.  . Do not take Glimepiride (AMARYL) and Tradjenta the morning of surgery.   How to Manage Your Diabetes Before and After Surgery  Why is it important to control my blood sugar before and after surgery? . Improving blood sugar levels before and after surgery helps healing and can limit problems. . A way of improving blood sugar control is eating a healthy diet by: o  Eating less sugar and carbohydrates o  Increasing activity/exercise o  Talking with your doctor about reaching your blood sugar goals . High blood sugars (greater than 180 mg/dL) can raise your risk of infections and slow your recovery, so you will need to focus on controlling your diabetes during the weeks before surgery. . Make sure that the doctor who takes care of your diabetes knows  about your planned surgery including the date and location.  How do I manage my blood sugar before surgery? . Check your blood sugar at least 4 times a day, starting 2 days before surgery, to make sure that the level is not too high or low. o Check your blood sugar the morning of your surgery when you wake up and every 2 hours until you get to the Short Stay unit. . If your blood sugar is less than 70 mg/dL, you will need to treat for low blood sugar: o Do not take insulin. o Treat a low blood sugar (less than 70 mg/dL) with  cup of clear juice (cranberry or apple), 4 glucose tablets, OR glucose gel. Recheck blood sugar in 15 minutes after treatment (to make sure it is greater than 70 mg/dL). If your blood sugar is not greater than 70 mg/dL on recheck, call [phone] o  for further instructions. . If your CBG is greater than 220 mg/dL, you may take  of your sliding scale (correction) dose of insulin.  . If you are admitted to the hospital after surgery: o Your blood sugar will be checked by the staff and you will probably be given insulin after surgery (instead of oral diabetes medicines) to make sure you have good blood sugar levels. o The goal for blood sugar control after surgery is 80-180 mg/dL  Reviewed and Endorsed by [facility] Patient Education Committee, August 2015     Do not wear jewelry, make-up or nail polish.  Do not wear lotions, powders, or perfumes,  or deodorant.  Do not shave 48 hours prior to surgery.    Do not bring valuables to the hospital.  [facility] is not responsible for any belongings or valuables.  Contacts, dentures or bridgework may not be worn into surgery.  Leave your suitcase in the car.  After surgery it may be brought to your room.  For patients admitted to the hospital, discharge time will be determined by your treatment team.  Patients discharged the day of surgery will not be allowed to drive home.   Special instructions:   [place]-  Preparing For Surgery  Before surgery, you can play an important role. Because skin is not sterile, your skin needs to be as free of germs as possible. You can reduce the number of germs on your skin by washing with CHG (chlorahexidine gluconate) Soap before surgery.  CHG is an antiseptic cleaner which kills germs and bonds with the skin to continue killing germs even after washing.    Oral Hygiene is also important to reduce your risk of infection.  Remember - BRUSH YOUR TEETH THE MORNING OF SURGERY WITH YOUR REGULAR TOOTHPASTE  Please do not use if you have an allergy to CHG or antibacterial soaps. If your skin becomes reddened/irritated stop using the CHG.  Do not shave (including legs and underarms) for at least 48 hours prior to first CHG shower. It is OK to shave your face.  Please follow these instructions carefully.   1. Shower the NIGHT BEFORE SURGERY and the MORNING OF SURGERY with CHG.   2. If you chose to wash your hair, wash your hair first as usual with your normal shampoo.  3. After you shampoo, rinse your hair and body thoroughly to remove the shampoo.  4. Use CHG as you would any other liquid soap. You can apply CHG directly to the skin and wash gently with a scrungie or a clean washcloth.   5. Apply the CHG Soap to your body ONLY FROM THE NECK DOWN.  Do not use on open wounds or open sores. Avoid contact with your eyes, ears, mouth and genitals (private parts). Wash Face and genitals (private parts)  with your normal soap.  6. Wash thoroughly, paying special attention to the area where your surgery will be performed.  7. Thoroughly rinse your body with warm water from the neck down.  8. DO NOT shower/wash with your normal soap after using and rinsing off the CHG Soap.  9. Pat yourself dry with a CLEAN TOWEL.  10. Wear CLEAN PAJAMAS to bed the night before surgery, wear comfortable clothes the morning of surgery  11. Place CLEAN SHEETS on your bed the night of your  first shower and DO NOT SLEEP WITH PETS.  Day of Surgery:  Do not apply any deodorants/lotions.  Please wear clean clothes to the hospital/surgery center.   Remember to brush your teeth WITH YOUR REGULAR TOOTHPASTE.  Please read over the following fact sheets that you were given. Pain Booklet, Coughing and Deep Breathing and Surgical Site Infection Prevention

## 2019-02-03 ENCOUNTER — Other Ambulatory Visit: Payer: Self-pay

## 2019-02-03 ENCOUNTER — Encounter (HOSPITAL_COMMUNITY): Payer: Self-pay

## 2019-02-03 ENCOUNTER — Encounter (HOSPITAL_COMMUNITY)
Admission: RE | Admit: 2019-02-03 | Discharge: 2019-02-03 | Disposition: A | Discharge status: Home / Self Care | Payer: Medicare Other | Source: Ambulatory Visit | Attending: Thoracic Surgery (Cardiothoracic Vascular Surgery) | Admitting: Thoracic Surgery (Cardiothoracic Vascular Surgery)

## 2019-02-03 DIAGNOSIS — R590 Localized enlarged lymph nodes: Secondary | ICD-10-CM | POA: Diagnosis not present

## 2019-02-03 DIAGNOSIS — Z01818 Encounter for other preprocedural examination: Secondary | ICD-10-CM | POA: Insufficient documentation

## 2019-02-03 DIAGNOSIS — R918 Other nonspecific abnormal finding of lung field: Secondary | ICD-10-CM | POA: Diagnosis not present

## 2019-02-03 LAB — PROTIME-INR
INR: 1 (ref 0.8–1.2)
Prothrombin Time: 12.6 seconds (ref 11.4–15.2)

## 2019-02-03 LAB — CBC
HCT: 39.7 % (ref 36.0–46.0)
Hemoglobin: 13.1 g/dL (ref 12.0–15.0)
MCH: 32.2 pg (ref 26.0–34.0)
MCHC: 33 g/dL (ref 30.0–36.0)
MCV: 97.5 fL (ref 80.0–100.0)
Platelets: 273 10*3/uL (ref 150–400)
RBC: 4.07 MIL/uL (ref 3.87–5.11)
RDW: 13.5 % (ref 11.5–15.5)
WBC: 11.4 10*3/uL — ABNORMAL HIGH (ref 4.0–10.5)
nRBC: 0 % (ref 0.0–0.2)

## 2019-02-03 LAB — COMPREHENSIVE METABOLIC PANEL
ALT: 25 U/L (ref 0–44)
AST: 22 U/L (ref 15–41)
Albumin: 3.4 g/dL — ABNORMAL LOW (ref 3.5–5.0)
Alkaline Phosphatase: 99 U/L (ref 38–126)
Anion gap: 15 (ref 5–15)
BUN: 14 mg/dL (ref 8–23)
CO2: 22 mmol/L (ref 22–32)
Calcium: 9.1 mg/dL (ref 8.9–10.3)
Chloride: 103 mmol/L (ref 98–111)
Creatinine, Ser: 1.08 mg/dL — ABNORMAL HIGH (ref 0.44–1.00)
GFR calc Af Amer: >60 mL/min (ref >60)
GFR calc non Af Amer: 54 mL/min — ABNORMAL LOW (ref >60)
Glucose, Bld: 119 mg/dL — ABNORMAL HIGH (ref 70–99)
Potassium: 4.1 mmol/L (ref 3.5–5.1)
Sodium: 140 mmol/L (ref 135–145)
Total Bilirubin: 0.5 mg/dL (ref 0.3–1.2)
Total Protein: 6.7 g/dL (ref 6.5–8.1)

## 2019-02-03 LAB — HEMOGLOBIN A1C
Hgb A1c MFr Bld: 6.4 % — ABNORMAL HIGH (ref 4.8–5.6)
Mean Plasma Glucose: 136.98 mg/dL

## 2019-02-03 LAB — APTT: aPTT: 29 seconds (ref 24–36)

## 2019-02-03 LAB — GLUCOSE, CAPILLARY: Glucose-Capillary: 127 mg/dL — ABNORMAL HIGH (ref 70–99)

## 2019-02-03 NOTE — Progress Notes (Signed)
PCP - Tiffany Plunk,FNP  Cardiologist - banerjee  Chest x-ray - DOS  EKG - 02-03-19(Epic)  Stress Test -   ECHO - 04/30/16 (Per Allison's notes. RN unable to retrieve it)  Cardiac Cath - denies  AICD-denies PM-denies LOOP-denies  Sleep Study - Yes CPAP - yes.Will bring it on DOS  LABS-CBC,CMP,PT,APTT,A1C  ASA-Will reach out to her PCP, for instructions on when to stop it.  HA1C- Fasting Blood Sugar - 163. CBG @ PAT appt.=127 Checks Blood Sugar __2___ times a day  Anesthesia-N  Pt denies having chest pain, sob, or fever at this time. All instructions explained to the pt, with a verbal understanding of the material. Pt agrees to go over the instructions while at home for a better understanding. The opportunity to ask questions was provided.

## 2019-02-03 NOTE — Progress Notes (Signed)

## 2019-02-06 ENCOUNTER — Encounter (HOSPITAL_COMMUNITY)
Admission: RE | Admit: 2019-02-06 | Discharge: 2019-02-06 | Disposition: A | Discharge status: Home / Self Care | Payer: Medicare Other | Source: Ambulatory Visit | Attending: Thoracic Surgery (Cardiothoracic Vascular Surgery) | Admitting: Thoracic Surgery (Cardiothoracic Vascular Surgery)

## 2019-02-06 ENCOUNTER — Other Ambulatory Visit: Payer: Self-pay

## 2019-02-06 DIAGNOSIS — R590 Localized enlarged lymph nodes: Secondary | ICD-10-CM

## 2019-02-06 DIAGNOSIS — R918 Other nonspecific abnormal finding of lung field: Secondary | ICD-10-CM

## 2019-02-06 DIAGNOSIS — Z01818 Encounter for other preprocedural examination: Secondary | ICD-10-CM | POA: Diagnosis not present

## 2019-02-06 LAB — PULMONARY FUNCTION TEST
DL/VA % pred: 101 %
DL/VA: 4.31 ml/min/mmHg/L
DLCO cor % pred: 86 %
DLCO cor: 16.15 ml/min/mmHg
DLCO unc % pred: 86 %
DLCO unc: 16 ml/min/mmHg
FEF 25-75 Post: 1.38 L/sec
FEF 25-75 Pre: 0.96 L/sec
FEF2575-%Change-Post: 43 %
FEF2575-%Pred-Post: 66 %
FEF2575-%Pred-Pre: 46 %
FEV1-%Change-Post: 9 %
FEV1-%Pred-Post: 74 %
FEV1-%Pred-Pre: 68 %
FEV1-Post: 1.69 L
FEV1-Pre: 1.55 L
FEV1FVC-%Change-Post: 3 %
FEV1FVC-%Pred-Pre: 91 %
FEV6-%Change-Post: 5 %
FEV6-%Pred-Post: 81 %
FEV6-%Pred-Pre: 77 %
FEV6-Post: 2.32 L
FEV6-Pre: 2.19 L
FEV6FVC-%Change-Post: 0 %
FEV6FVC-%Pred-Post: 104 %
FEV6FVC-%Pred-Pre: 104 %
FVC-%Change-Post: 5 %
FVC-%Pred-Post: 78 %
FVC-%Pred-Pre: 74 %
FVC-Post: 2.32 L
FVC-Pre: 2.19 L
Post FEV1/FVC ratio: 73 %
Post FEV6/FVC ratio: 100 %
Pre FEV1/FVC ratio: 71 %
Pre FEV6/FVC Ratio: 100 %
RV % pred: 121 %
RV: 2.39 L
TLC % pred: 98 %
TLC: 4.67 L

## 2019-02-06 MED ORDER — ALBUTEROL SULFATE (2.5 MG/3ML) 0.083% IN NEBU
2.5000 mg | INHALATION_SOLUTION | Freq: Once | RESPIRATORY_TRACT | Status: AC
  Administered 2019-02-06: 2.5 mg via RESPIRATORY_TRACT

## 2019-02-08 NOTE — Progress Notes (Signed)

## 2019-02-09 ENCOUNTER — Encounter (HOSPITAL_COMMUNITY): Payer: Self-pay

## 2019-02-09 ENCOUNTER — Other Ambulatory Visit: Payer: Self-pay

## 2019-02-09 ENCOUNTER — Ambulatory Visit (HOSPITAL_COMMUNITY)
Admission: RE | Admit: 2019-02-09 | Discharge: 2019-02-09 | Disposition: A | Discharge status: Home / Self Care | Payer: Medicare Other | Attending: Thoracic Surgery (Cardiothoracic Vascular Surgery) | Admitting: Thoracic Surgery (Cardiothoracic Vascular Surgery)

## 2019-02-09 ENCOUNTER — Ambulatory Visit (HOSPITAL_COMMUNITY): Payer: Medicare Other | Admitting: Physician Assistant

## 2019-02-09 ENCOUNTER — Ambulatory Visit (HOSPITAL_COMMUNITY): Payer: Medicare Other

## 2019-02-09 ENCOUNTER — Ambulatory Visit (HOSPITAL_COMMUNITY): Payer: Medicare Other | Admitting: Certified Registered Nurse Anesthetist

## 2019-02-09 ENCOUNTER — Encounter (HOSPITAL_COMMUNITY)
Admission: RE | Disposition: A | Discharge status: Home / Self Care | Payer: Self-pay | Source: Home / Self Care | Attending: Thoracic Surgery (Cardiothoracic Vascular Surgery)

## 2019-02-09 DIAGNOSIS — Z79899 Other long term (current) drug therapy: Secondary | ICD-10-CM | POA: Insufficient documentation

## 2019-02-09 DIAGNOSIS — K219 Gastro-esophageal reflux disease without esophagitis: Secondary | ICD-10-CM | POA: Diagnosis not present

## 2019-02-09 DIAGNOSIS — Z7982 Long term (current) use of aspirin: Secondary | ICD-10-CM | POA: Diagnosis not present

## 2019-02-09 DIAGNOSIS — N189 Chronic kidney disease, unspecified: Secondary | ICD-10-CM | POA: Insufficient documentation

## 2019-02-09 DIAGNOSIS — R918 Other nonspecific abnormal finding of lung field: Secondary | ICD-10-CM | POA: Diagnosis present

## 2019-02-09 DIAGNOSIS — Z801 Family history of malignant neoplasm of trachea, bronchus and lung: Secondary | ICD-10-CM | POA: Insufficient documentation

## 2019-02-09 DIAGNOSIS — M797 Fibromyalgia: Secondary | ICD-10-CM | POA: Insufficient documentation

## 2019-02-09 DIAGNOSIS — M1990 Unspecified osteoarthritis, unspecified site: Secondary | ICD-10-CM | POA: Diagnosis not present

## 2019-02-09 DIAGNOSIS — Z87891 Personal history of nicotine dependence: Secondary | ICD-10-CM | POA: Diagnosis not present

## 2019-02-09 DIAGNOSIS — I129 Hypertensive chronic kidney disease with stage 1 through stage 4 chronic kidney disease, or unspecified chronic kidney disease: Secondary | ICD-10-CM | POA: Insufficient documentation

## 2019-02-09 DIAGNOSIS — G8929 Other chronic pain: Secondary | ICD-10-CM | POA: Insufficient documentation

## 2019-02-09 DIAGNOSIS — Z419 Encounter for procedure for purposes other than remedying health state, unspecified: Secondary | ICD-10-CM

## 2019-02-09 DIAGNOSIS — E785 Hyperlipidemia, unspecified: Secondary | ICD-10-CM | POA: Diagnosis not present

## 2019-02-09 DIAGNOSIS — Z8673 Personal history of transient ischemic attack (TIA), and cerebral infarction without residual deficits: Secondary | ICD-10-CM | POA: Diagnosis not present

## 2019-02-09 DIAGNOSIS — E1140 Type 2 diabetes mellitus with diabetic neuropathy, unspecified: Secondary | ICD-10-CM | POA: Diagnosis not present

## 2019-02-09 DIAGNOSIS — C3412 Malignant neoplasm of upper lobe, left bronchus or lung: Secondary | ICD-10-CM | POA: Diagnosis not present

## 2019-02-09 DIAGNOSIS — Z803 Family history of malignant neoplasm of breast: Secondary | ICD-10-CM | POA: Insufficient documentation

## 2019-02-09 DIAGNOSIS — Z79890 Hormone replacement therapy (postmenopausal): Secondary | ICD-10-CM | POA: Diagnosis not present

## 2019-02-09 DIAGNOSIS — Z7984 Long term (current) use of oral hypoglycemic drugs: Secondary | ICD-10-CM | POA: Insufficient documentation

## 2019-02-09 DIAGNOSIS — C3411 Malignant neoplasm of upper lobe, right bronchus or lung: Secondary | ICD-10-CM | POA: Diagnosis not present

## 2019-02-09 DIAGNOSIS — E039 Hypothyroidism, unspecified: Secondary | ICD-10-CM | POA: Diagnosis not present

## 2019-02-09 DIAGNOSIS — E1122 Type 2 diabetes mellitus with diabetic chronic kidney disease: Secondary | ICD-10-CM | POA: Diagnosis not present

## 2019-02-09 DIAGNOSIS — G4733 Obstructive sleep apnea (adult) (pediatric): Secondary | ICD-10-CM | POA: Diagnosis not present

## 2019-02-09 DIAGNOSIS — R590 Localized enlarged lymph nodes: Secondary | ICD-10-CM

## 2019-02-09 HISTORY — PX: VIDEO BRONCHOSCOPY WITH RADIAL ENDOBRONCHIAL ULTRASOUND: SHX6849

## 2019-02-09 LAB — GLUCOSE, CAPILLARY
Glucose-Capillary: 153 mg/dL — ABNORMAL HIGH (ref 70–99)
Glucose-Capillary: 132 mg/dL — ABNORMAL HIGH (ref 70–99)
Glucose-Capillary: 183 mg/dL — ABNORMAL HIGH (ref 70–99)

## 2019-02-09 SURGERY — VIDEO BRONCHOSCOPY WITH RADIAL ENDOBRONCHIAL ULTRASOUND
Anesthesia: General | Site: Chest

## 2019-02-09 MED ORDER — ROCURONIUM BROMIDE 50 MG/5ML IV SOSY
PREFILLED_SYRINGE | INTRAVENOUS | Status: DC | PRN
  Administered 2019-02-09: 50 mg via INTRAVENOUS

## 2019-02-09 MED ORDER — EPINEPHRINE PF 1 MG/ML IJ SOLN
INTRAMUSCULAR | Status: DC | PRN
  Administered 2019-02-09 (×2): 1 mg via ENDOTRACHEOPULMONARY

## 2019-02-09 MED ORDER — ONDANSETRON HCL 4 MG/2ML IJ SOLN
INTRAMUSCULAR | Status: DC | PRN
  Administered 2019-02-09: 4 mg via INTRAVENOUS

## 2019-02-09 MED ORDER — MEPERIDINE HCL 50 MG/ML IJ SOLN: 6.2500 mg | INTRAMUSCULAR | Status: DC | PRN

## 2019-02-09 MED ORDER — PROPOFOL 10 MG/ML IV BOLUS
INTRAVENOUS | Status: AC
  Filled 2019-02-09: qty 20

## 2019-02-09 MED ORDER — EPINEPHRINE PF 1 MG/ML IJ SOLN
INTRAMUSCULAR | Status: AC
  Filled 2019-02-09: qty 1

## 2019-02-09 MED ORDER — SODIUM CHLORIDE 0.9 % IV SOLN
INTRAVENOUS | Status: DC | PRN
  Administered 2019-02-09: 20 ug/min via INTRAVENOUS

## 2019-02-09 MED ORDER — PHENYLEPHRINE 40 MCG/ML (10ML) SYRINGE FOR IV PUSH (FOR BLOOD PRESSURE SUPPORT)
PREFILLED_SYRINGE | INTRAVENOUS | Status: AC
  Filled 2019-02-09: qty 10

## 2019-02-09 MED ORDER — ACETAMINOPHEN 10 MG/ML IV SOLN: 1000.0000 mg | Freq: Once | INTRAVENOUS | Status: DC | PRN

## 2019-02-09 MED ORDER — LACTATED RINGERS IV SOLN
INTRAVENOUS | Status: DC
  Administered 2019-02-09: 08:00:00 via INTRAVENOUS

## 2019-02-09 MED ORDER — LACTATED RINGERS IV SOLN: INTRAVENOUS | Status: DC

## 2019-02-09 MED ORDER — SUGAMMADEX SODIUM 200 MG/2ML IV SOLN
INTRAVENOUS | Status: DC | PRN
  Administered 2019-02-09: 190 mg via INTRAVENOUS

## 2019-02-09 MED ORDER — CEFAZOLIN SODIUM-DEXTROSE 2-3 GM-%(50ML) IV SOLR
INTRAVENOUS | Status: DC | PRN
  Administered 2019-02-09: 2 g via INTRAVENOUS

## 2019-02-09 MED ORDER — MIDAZOLAM HCL 2 MG/2ML IJ SOLN
INTRAMUSCULAR | Status: AC
  Filled 2019-02-09: qty 2

## 2019-02-09 MED ORDER — SUCCINYLCHOLINE CHLORIDE 200 MG/10ML IV SOSY
PREFILLED_SYRINGE | INTRAVENOUS | Status: AC
  Filled 2019-02-09: qty 10

## 2019-02-09 MED ORDER — ACETAMINOPHEN 160 MG/5ML PO SOLN: 325.0000 mg | Freq: Once | ORAL | Status: DC

## 2019-02-09 MED ORDER — PROMETHAZINE HCL 25 MG/ML IJ SOLN: 6.2500 mg | INTRAMUSCULAR | Status: DC | PRN

## 2019-02-09 MED ORDER — ONDANSETRON HCL 4 MG/2ML IJ SOLN
INTRAMUSCULAR | Status: AC
  Filled 2019-02-09: qty 2

## 2019-02-09 MED ORDER — LIDOCAINE 2% (20 MG/ML) 5 ML SYRINGE
INTRAMUSCULAR | Status: AC
  Filled 2019-02-09: qty 5

## 2019-02-09 MED ORDER — SUCCINYLCHOLINE CHLORIDE 200 MG/10ML IV SOSY
PREFILLED_SYRINGE | INTRAVENOUS | Status: DC | PRN
  Administered 2019-02-09: 100 mg via INTRAVENOUS

## 2019-02-09 MED ORDER — CEFAZOLIN SODIUM-DEXTROSE 2-4 GM/100ML-% IV SOLN
INTRAVENOUS | Status: AC
  Filled 2019-02-09: qty 100

## 2019-02-09 MED ORDER — MIDAZOLAM HCL 5 MG/5ML IJ SOLN
INTRAMUSCULAR | Status: DC | PRN
  Administered 2019-02-09: 2 mg via INTRAVENOUS

## 2019-02-09 MED ORDER — ROCURONIUM BROMIDE 50 MG/5ML IV SOSY
PREFILLED_SYRINGE | INTRAVENOUS | Status: AC
  Filled 2019-02-09: qty 5

## 2019-02-09 MED ORDER — PHENYLEPHRINE 40 MCG/ML (10ML) SYRINGE FOR IV PUSH (FOR BLOOD PRESSURE SUPPORT)
PREFILLED_SYRINGE | INTRAVENOUS | Status: DC | PRN
  Administered 2019-02-09: 80 ug via INTRAVENOUS

## 2019-02-09 MED ORDER — FENTANYL CITRATE (PF) 250 MCG/5ML IJ SOLN
INTRAMUSCULAR | Status: DC | PRN
  Administered 2019-02-09: 75 ug via INTRAVENOUS
  Administered 2019-02-09: 25 ug via INTRAVENOUS
  Administered 2019-02-09: 50 ug via INTRAVENOUS

## 2019-02-09 MED ORDER — DEXAMETHASONE SODIUM PHOSPHATE 10 MG/ML IJ SOLN
INTRAMUSCULAR | Status: DC | PRN
  Administered 2019-02-09: 5 mg via INTRAVENOUS

## 2019-02-09 MED ORDER — LIDOCAINE 2% (20 MG/ML) 5 ML SYRINGE
INTRAMUSCULAR | Status: DC | PRN
  Administered 2019-02-09: 50 mg via INTRAVENOUS

## 2019-02-09 MED ORDER — FENTANYL CITRATE (PF) 100 MCG/2ML IJ SOLN: 25.0000 ug | INTRAMUSCULAR | Status: DC | PRN

## 2019-02-09 MED ORDER — 0.9 % SODIUM CHLORIDE (POUR BTL) OPTIME
TOPICAL | Status: DC | PRN
  Administered 2019-02-09: 1000 mL

## 2019-02-09 MED ORDER — LACTATED RINGERS IV SOLN
INTRAVENOUS | Status: DC | PRN
  Administered 2019-02-09: 08:00:00 via INTRAVENOUS

## 2019-02-09 MED ORDER — ACETAMINOPHEN 325 MG PO TABS: 325.0000 mg | ORAL_TABLET | Freq: Once | ORAL | Status: DC

## 2019-02-09 MED ORDER — DEXAMETHASONE SODIUM PHOSPHATE 10 MG/ML IJ SOLN
INTRAMUSCULAR | Status: AC
  Filled 2019-02-09: qty 1

## 2019-02-09 MED ORDER — FENTANYL CITRATE (PF) 250 MCG/5ML IJ SOLN
INTRAMUSCULAR | Status: AC
  Filled 2019-02-09: qty 5

## 2019-02-09 MED ORDER — PROPOFOL 10 MG/ML IV BOLUS
INTRAVENOUS | Status: DC | PRN
  Administered 2019-02-09: 100 mg via INTRAVENOUS
  Administered 2019-02-09: 20 mg via INTRAVENOUS

## 2019-02-09 SURGICAL SUPPLY — 44 items
ADAPTER VALVE BIOPSY EBUS (MISCELLANEOUS) IMPLANT
ADPTR VALVE BIOPSY EBUS (MISCELLANEOUS) ×2
BRUSH CYTOL CELLEBRITY 1.5X140 (MISCELLANEOUS) IMPLANT
CANISTER SUCT 3000ML PPV (MISCELLANEOUS) ×3 IMPLANT
CHANNEL WORK EXTEND EDGE 180 (KITS) ×2 IMPLANT
CONT SPEC 4OZ CLIKSEAL STRL BL (MISCELLANEOUS) ×5 IMPLANT
COVER BACK TABLE 60X90IN (DRAPES) ×3 IMPLANT
FILTER STRAW FLUID ASPIR (MISCELLANEOUS) ×2 IMPLANT
FORCEPS BIOP 1.5 SINGLE USE (MISCELLANEOUS) IMPLANT
FORCEPS BIOP RJ4 1.8 (CUTTING FORCEPS) ×2 IMPLANT
FORCEPS BIOP SUPERTRX PREMAR (INSTRUMENTS) ×2 IMPLANT
FORCEPS RADIAL JAW LRG 4 PULM (INSTRUMENTS) IMPLANT
GAUZE SPONGE 4X4 12PLY STRL (GAUZE/BANDAGES/DRESSINGS) IMPLANT
GLOVE SURG SIGNA 7.5 PF LTX (GLOVE) ×3 IMPLANT
GOWN STRL REUS W/ TWL XL LVL3 (GOWN DISPOSABLE) ×1 IMPLANT
GOWN STRL REUS W/TWL XL LVL3 (GOWN DISPOSABLE) ×2
KIT CLEAN ENDO COMPLIANCE (KITS) ×6 IMPLANT
KIT GUIDESHEATH 2.0 (SHEATH) ×2 IMPLANT
KIT TURNOVER KIT B (KITS) ×3 IMPLANT
MARKER SKIN DUAL TIP RULER LAB (MISCELLANEOUS) ×3 IMPLANT
NDL ASPIRATION VIZISHOT 19G (NEEDLE) IMPLANT
NDL ASPIRATION VIZISHOT 21G (NEEDLE) ×1 IMPLANT
NDL BLUNT 18X1 FOR OR ONLY (NEEDLE) IMPLANT
NEEDLE ASPIRATION VIZISHOT 19G (NEEDLE) ×3 IMPLANT
NEEDLE ASPIRATION VIZISHOT 21G (NEEDLE) IMPLANT
NEEDLE BLUNT 18X1 FOR OR ONLY (NEEDLE) IMPLANT
NS IRRIG 1000ML POUR BTL (IV SOLUTION) ×3 IMPLANT
OIL SILICONE PENTAX (PARTS (SERVICE/REPAIRS)) ×3 IMPLANT
PAD ARMBOARD 7.5X6 YLW CONV (MISCELLANEOUS) ×6 IMPLANT
RADIAL JAW LRG 4 PULMONARY (INSTRUMENTS)
SYR 20CC LL (SYRINGE) ×3 IMPLANT
SYR 20ML ECCENTRIC (SYRINGE) ×3 IMPLANT
SYR 3ML LL SCALE MARK (SYRINGE) IMPLANT
SYR 5ML LL (SYRINGE) ×3 IMPLANT
SYR 5ML LUER SLIP (SYRINGE) ×3 IMPLANT
TOWEL GREEN STERILE (TOWEL DISPOSABLE) ×3 IMPLANT
TOWEL GREEN STERILE FF (TOWEL DISPOSABLE) ×3 IMPLANT
TRAP SPECIMEN MUCOUS 40CC (MISCELLANEOUS) ×3 IMPLANT
TUBE CONNECTING 20'X1/4 (TUBING) ×1
TUBE CONNECTING 20X1/4 (TUBING) ×2 IMPLANT
VALVE BIOPSY  SINGLE USE (MISCELLANEOUS) ×4
VALVE BIOPSY SINGLE USE (MISCELLANEOUS) ×1 IMPLANT
VALVE SUCTION BRONCHIO DISP (MISCELLANEOUS) ×3 IMPLANT
WATER STERILE IRR 1000ML POUR (IV SOLUTION) ×3 IMPLANT

## 2019-02-09 NOTE — Op Note (Signed)
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  PROCEDURES:   1.  Bronchoscopy with radial EBUS with brushings, transbronchial biopsies and endobronchial biopsies.   2.  Endobronchial ultrasound with mediastinal lymph node aspirations and hilar lymph node aspirations.  SURGEON:  Modesto Charon, MD  ASSISTANT:  None.  ANESTHESIA:  General.  FINDINGS:  Quick prep of nodes- no tumor was seen.  Brushings showed non-small-cell carcinoma.  CLINICAL NOTE:  Tonya Shelton is a 63 year old woman with a history of tobacco abuse who was referred for low dose lung cancer screening.  She was found to have a left upper lobe mass with probable hilar adenopathy.  On PET CT the mass and nodes were  hypermetabolic.  She was referred for further workup and management.  She was advised to undergo bronchoscopy and endobronchial ultrasound for diagnostic and staging purposes.  The indications, risks, benefits, and alternatives were discussed in detail  with the patient.  She understood and accepted the risks and agreed to proceed.  OPERATIVE NOTE:  The patient was brought to the operating room on 02/09/2019.  She had induction of general anesthesia and was intubated.  A timeout was performed.  Intravenous antibiotics were administered.  Flexible fiberoptic bronchoscopy was  performed via the endotracheal tube.  On the right, there was normal endobronchial anatomy with no endobronchial lesions.  On the left, there was some abnormality of the mucosa in the left upper lobe bronchus near its origin.  There also was  extrinsic  compression of the apicoposterior segmental bronchus.  The bronchoscope was removed.  The endobronchial ultrasound probe was advanced.  The level 11 lymph nodes on the left side were aspirated.  There was some bleeding with these aspirations.   Aspirations were obtained both with and without suction applied.  Each aspiration involved at least 15 passes of the needle within the lymph node with ultrasound visualization.  Next, a small 4L node was aspirated and then multiple aspirations were  obtained from relatively normal sized subcarinal lymph node.  Dilute epinephrine was applied topically to help control bleeding.  The bronchoscope then was replaced.  It was directed to the left upper lobe bronchus.  The radial ultrasound probe was placed via the bronchoscope and advanced into the posterior segment of the left upper lobe.  Fluoroscopy was used during all sampling  and also to ensure position of the radial EBUS probe in the vicinity of the mass.  The ultrasound probe was removed and brushings were obtained. Multiple biopsies were taken while the brushings were being prepared. The brushings showed evidence of malignancy  consistent with non-small cell carcinoma.  Multiple additional biopsies were obtained.  There was bleeding with biopsies and saline and dilute epinephrine were applied.  The sheath for the radial EBUS probe did become dislodged during the biopsy process  and attempts to get it placed back into the original location were unsuccessful.  Multiple additional biopsies were taken, again using fluoroscopy to ensure biopsies were taken from an appropriate area.  Finally, biopsies were taken from the mucosal  abnormality near the origin of the left upper lobe bronchus.  There also was bleeding with these biopsies which again cleared  with saline.  A final inspection was made with the bronchoscope.  There was no ongoing bleeding.  The bronchoscope was removed.    The patient was extubated  in the operating room and taken to the [facility] in good condition.    The total fluoroscopy time was 4.9 minutes.  AN/NUANCE  D:02/09/2019 T:02/09/2019 JOB:[id number]

## 2019-02-09 NOTE — Anesthesia Postprocedure Evaluation (Signed)
Anesthesia Post Note  Patient: TAKEELA PEIL  Procedure(s) Performed: VIDEO BRONCHOSCOPY WITH RADIAL ENDOBRONCHIAL ULTRASOUND (N/A Chest)     Patient location during evaluation: PACU Anesthesia Type: General Level of consciousness: awake and alert Pain management: pain level controlled Vital Signs Assessment: post-procedure vital signs reviewed and stable Respiratory status: spontaneous breathing, nonlabored ventilation, respiratory function stable and patient connected to nasal cannula oxygen Cardiovascular status: blood pressure returned to baseline and stable Postop Assessment: no apparent nausea or vomiting Anesthetic complications: no    Last Vitals:  Vitals:   02/09/19 1318 02/09/19 1319  BP: (!) 127/58   Pulse: 80 76  Resp: 19 15  Temp:    SpO2: 96% 96%    Last Pain:  Vitals:   02/09/19 0909  TempSrc: Oral  PainSc:                  Effie Berkshire

## 2019-02-09 NOTE — Interval H&P Note (Signed)
History and Physical Interval Note:  02/09/2019 10:09 AM  Tonya Shelton  has presented today for surgery, with the diagnosis of LUL MASS HILAR ADENOPATHY.  The various methods of treatment have been discussed with the patient and family. After consideration of risks, benefits and other options for treatment, the patient has consented to  Procedure(s): [place] (N/A) as a surgical intervention.  The patient's history has been reviewed, patient examined, no change in status, stable for surgery.  I have reviewed the patient's chart and labs.  Questions were answered to the patient's satisfaction.     [name]

## 2019-02-09 NOTE — Transfer of Care (Signed)
Immediate Anesthesia Transfer of Care Note  Patient: Tonya Shelton  Procedure(s) Performed: VIDEO BRONCHOSCOPY WITH RADIAL ENDOBRONCHIAL ULTRASOUND (N/A Chest)  Patient Location: PACU  Anesthesia Type:General  Level of Consciousness: awake  Airway & Oxygen Therapy: Patient Spontanous Breathing  Post-op Assessment: Report given to RN  Post vital signs: Reviewed and stable  Last Vitals:  Vitals Value Taken Time  BP 86/74 02/09/2019 12:18 PM  Temp    Pulse 79 02/09/2019 12:18 PM  Resp    SpO2 94 % 02/09/2019 12:18 PM  Vitals shown include unvalidated device data.  Last Pain:  Vitals:   02/09/19 0909  TempSrc: Oral  PainSc:          Complications: No apparent anesthesia complications

## 2019-02-09 NOTE — Anesthesia Preprocedure Evaluation (Addendum)
Anesthesia Evaluation  Patient identified by MRN, date of birth, ID band Patient awake    Reviewed: Allergy & Precautions, NPO status , Patient's Chart, lab work & pertinent test results  Airway Mallampati: II  TM Distance: >3 FB Neck ROM: Full    Dental  (+) Edentulous Upper, Edentulous Lower, Dental Advisory Given   Pulmonary sleep apnea and Continuous Positive Airway Pressure Ventilation , former smoker,    breath sounds clear to auscultation       Cardiovascular hypertension,  Rhythm:Regular Rate:Normal     Neuro/Psych Depression CVA    GI/Hepatic Neg liver ROS, hiatal hernia, GERD  ,  Endo/Other  diabetesHypothyroidism   Renal/GU Renal disease     Musculoskeletal  (+) Arthritis ,   Abdominal Normal abdominal exam  (+)   Peds  Hematology   Anesthesia Other Findings   Reproductive/Obstetrics                           Anesthesia Physical Anesthesia Plan  ASA: III  Anesthesia Plan: General   Post-op Pain Management:    Induction: Intravenous  PONV Risk Score and Plan: 4 or greater and Ondansetron, Dexamethasone, Midazolam and Scopolamine patch - Pre-op  Airway Management Planned: Oral ETT  Additional Equipment: None  Intra-op Plan:   Post-operative Plan: Extubation in OR  Informed Consent: I have reviewed the patients History and Physical, chart, labs and discussed the procedure including the risks, benefits and alternatives for the proposed anesthesia with the patient or authorized representative who has indicated his/her understanding and acceptance.       Plan Discussed with: CRNA  Anesthesia Plan Comments:        Anesthesia Quick Evaluation

## 2019-02-09 NOTE — Brief Op Note (Signed)
02/09/2019  12:23 PM  PATIENT:  Tonya Shelton  63 y.o. female  PRE-OPERATIVE DIAGNOSIS:  LUL MASS HILAR ADENOPATHY  POST-OPERATIVE DIAGNOSIS:  LUL MASS HILAR ADENOPATHY  PROCEDURE:  1.VIDEOBRONCHOSCOPY WITH RADIAL ULTRASOUND WITH BRUSHINGS, TRANSBRONCHIAL BIOPSIES AND ENDOBRONCHIAL BIOPSIES 2. ENDOBRONCHIAL ULTRASOUND  SURGEON:  Surgeon(s) and Role:    * [name], MD - Primary  PHYSICIAN ASSISTANT:   ASSISTANTS: none   ANESTHESIA:   general  EBL:  20 mL   BLOOD ADMINISTERED:none  DRAINS: none   LOCAL MEDICATIONS USED:  NONE  SPECIMEN:  Source of Specimen:  LUL, LEVEL 11L, 7 4L NODES  DISPOSITION OF SPECIMEN:  PATHOLOGY  COUNTS:  NO ENDO  TOURNIQUET:  * No tourniquets in log *  DICTATION: .Other Dictation: Dictation Number -  PLAN OF CARE: Discharge to home after PACU  PATIENT DISPOSITION:  PACU - hemodynamically stable.   Delay start of Pharmacological VTE agent (>24hrs) due to surgical blood loss or risk of bleeding: not applicable

## 2019-02-09 NOTE — Anesthesia Procedure Notes (Signed)
Procedure Name: Intubation Date/Time: 02/09/2019 10:26 AM Performed by: Harden Mo, CRNA Pre-anesthesia Checklist: Patient identified, Emergency Drugs available, Suction available and Patient being monitored Patient Re-evaluated:Patient Re-evaluated prior to induction Oxygen Delivery Method: Circle System Utilized Preoxygenation: Pre-oxygenation with 100% oxygen Induction Type: IV induction and Rapid sequence Laryngoscope Size: Miller and 2 Grade View: Grade I Tube type: Oral Tube size: 8.5 mm Number of attempts: 1 Airway Equipment and Method: Stylet and Oral airway Placement Confirmation: ETT inserted through vocal cords under direct vision,  positive ETCO2 and breath sounds checked- equal and bilateral Secured at: 21 cm Tube secured with: Tape Dental Injury: Teeth and Oropharynx as per pre-operative assessment

## 2019-02-10 ENCOUNTER — Encounter (HOSPITAL_COMMUNITY): Payer: Self-pay | Admitting: Thoracic Surgery (Cardiothoracic Vascular Surgery)

## 2019-02-13 ENCOUNTER — Telehealth: Payer: Self-pay | Admitting: *Deleted

## 2019-02-13 DIAGNOSIS — R918 Other nonspecific abnormal finding of lung field: Secondary | ICD-10-CM

## 2019-02-13 NOTE — Telephone Encounter (Signed)
Oncology Nurse Navigator Documentation  Oncology Nurse Navigator Flowsheets 02/13/2019  Navigator Location [facility]-  Referral date to RadOnc/MedOnc 02/09/2019  Navigator Encounter Type Telephone/I received referral on Ms. Tonya Shelton.  I called and scheduled her to be seen with Dr. Julien Nordmann at [facility] this week.  She verbalized understanding of appt time and place.   Telephone Outgoing Call  Treatment Phase Pre-Tx/Tx Discussion  Barriers/Navigation Needs Education;Coordination of Care  Education Other  Interventions Coordination of Care;Education  Coordination of Care Appts  Education Method Verbal  Acuity Level 2  Time Spent with Patient 30

## 2019-02-16 ENCOUNTER — Other Ambulatory Visit: Payer: Self-pay | Admitting: *Deleted

## 2019-02-16 ENCOUNTER — Other Ambulatory Visit: Payer: Self-pay

## 2019-02-16 ENCOUNTER — Inpatient Hospital Stay: Payer: Medicare Other | Admitting: Internal Medicine

## 2019-02-16 ENCOUNTER — Encounter: Payer: Self-pay | Admitting: *Deleted

## 2019-02-16 ENCOUNTER — Inpatient Hospital Stay: Payer: Medicare Other | Attending: Internal Medicine

## 2019-02-16 ENCOUNTER — Institutional Professional Consult (permissible substitution) (INDEPENDENT_AMBULATORY_CARE_PROVIDER_SITE_OTHER): Payer: Medicare Other | Admitting: Thoracic Surgery (Cardiothoracic Vascular Surgery)

## 2019-02-16 ENCOUNTER — Encounter: Payer: Self-pay | Admitting: Internal Medicine

## 2019-02-16 VITALS — BP 144/54 | HR 77 | Temp 98.0°F | Resp 20 | Ht 62.0 in | Wt 207.4 lb

## 2019-02-16 VITALS — BP 144/54 | HR 77 | Temp 98.0°F | Resp 20 | Wt 207.4 lb

## 2019-02-16 DIAGNOSIS — D649 Anemia, unspecified: Secondary | ICD-10-CM | POA: Diagnosis not present

## 2019-02-16 DIAGNOSIS — Z8673 Personal history of transient ischemic attack (TIA), and cerebral infarction without residual deficits: Secondary | ICD-10-CM | POA: Diagnosis not present

## 2019-02-16 DIAGNOSIS — C3492 Malignant neoplasm of unspecified part of left bronchus or lung: Secondary | ICD-10-CM

## 2019-02-16 DIAGNOSIS — Z7189 Other specified counseling: Secondary | ICD-10-CM

## 2019-02-16 DIAGNOSIS — I10 Essential (primary) hypertension: Secondary | ICD-10-CM

## 2019-02-16 DIAGNOSIS — I129 Hypertensive chronic kidney disease with stage 1 through stage 4 chronic kidney disease, or unspecified chronic kidney disease: Secondary | ICD-10-CM | POA: Diagnosis not present

## 2019-02-16 DIAGNOSIS — E1122 Type 2 diabetes mellitus with diabetic chronic kidney disease: Secondary | ICD-10-CM | POA: Diagnosis not present

## 2019-02-16 DIAGNOSIS — E039 Hypothyroidism, unspecified: Secondary | ICD-10-CM | POA: Diagnosis not present

## 2019-02-16 DIAGNOSIS — Z5111 Encounter for antineoplastic chemotherapy: Secondary | ICD-10-CM

## 2019-02-16 DIAGNOSIS — R918 Other nonspecific abnormal finding of lung field: Secondary | ICD-10-CM

## 2019-02-16 DIAGNOSIS — M810 Age-related osteoporosis without current pathological fracture: Secondary | ICD-10-CM | POA: Insufficient documentation

## 2019-02-16 DIAGNOSIS — N189 Chronic kidney disease, unspecified: Secondary | ICD-10-CM | POA: Diagnosis not present

## 2019-02-16 DIAGNOSIS — Z803 Family history of malignant neoplasm of breast: Secondary | ICD-10-CM

## 2019-02-16 DIAGNOSIS — Z87891 Personal history of nicotine dependence: Secondary | ICD-10-CM

## 2019-02-16 DIAGNOSIS — F419 Anxiety disorder, unspecified: Secondary | ICD-10-CM | POA: Insufficient documentation

## 2019-02-16 DIAGNOSIS — C3412 Malignant neoplasm of upper lobe, left bronchus or lung: Secondary | ICD-10-CM | POA: Diagnosis not present

## 2019-02-16 DIAGNOSIS — H269 Unspecified cataract: Secondary | ICD-10-CM

## 2019-02-16 DIAGNOSIS — R05 Cough: Secondary | ICD-10-CM

## 2019-02-16 DIAGNOSIS — C3490 Malignant neoplasm of unspecified part of unspecified bronchus or lung: Secondary | ICD-10-CM

## 2019-02-16 LAB — CBC WITH DIFFERENTIAL (CANCER CENTER ONLY)
Abs Immature Granulocytes: 0.06 10*3/uL (ref 0.00–0.07)
Basophils Absolute: 0 10*3/uL (ref 0.0–0.1)
Basophils Relative: 0 %
Eosinophils Absolute: 0.2 10*3/uL (ref 0.0–0.5)
Eosinophils Relative: 1 %
HCT: 39.5 % (ref 36.0–46.0)
Hemoglobin: 12.9 g/dL (ref 12.0–15.0)
Immature Granulocytes: 1 %
Lymphocytes Relative: 31 %
Lymphs Abs: 4 10*3/uL (ref 0.7–4.0)
MCH: 31.6 pg (ref 26.0–34.0)
MCHC: 32.7 g/dL (ref 30.0–36.0)
MCV: 96.8 fL (ref 80.0–100.0)
Monocytes Absolute: 0.9 10*3/uL (ref 0.1–1.0)
Monocytes Relative: 7 %
Neutro Abs: 7.7 10*3/uL (ref 1.7–7.7)
Neutrophils Relative %: 60 %
Platelet Count: 275 10*3/uL (ref 150–400)
RBC: 4.08 MIL/uL (ref 3.87–5.11)
RDW: 14 % (ref 11.5–15.5)
WBC Count: 12.8 10*3/uL — ABNORMAL HIGH (ref 4.0–10.5)
nRBC: 0 % (ref 0.0–0.2)

## 2019-02-16 LAB — CMP (CANCER CENTER ONLY)
ALT: 22 U/L (ref 0–44)
AST: 16 U/L (ref 15–41)
Albumin: 3.4 g/dL — ABNORMAL LOW (ref 3.5–5.0)
Alkaline Phosphatase: 119 U/L (ref 38–126)
Anion gap: 12 (ref 5–15)
BUN: 10 mg/dL (ref 8–23)
CO2: 28 mmol/L (ref 22–32)
Calcium: 8.9 mg/dL (ref 8.9–10.3)
Chloride: 103 mmol/L (ref 98–111)
Creatinine: 1.04 mg/dL — ABNORMAL HIGH (ref 0.44–1.00)
GFR, Est AFR Am: >60 mL/min (ref >60)
GFR, Estimated: 57 mL/min — ABNORMAL LOW (ref >60)
Glucose, Bld: 74 mg/dL (ref 70–99)
Potassium: 3.2 mmol/L — ABNORMAL LOW (ref 3.5–5.1)
Sodium: 143 mmol/L (ref 135–145)
Total Bilirubin: 0.4 mg/dL (ref 0.3–1.2)
Total Protein: 7.4 g/dL (ref 6.5–8.1)

## 2019-02-16 MED ORDER — PROCHLORPERAZINE MALEATE 10 MG PO TABS: 10.0000 mg | ORAL_TABLET | Freq: Four times a day (QID) | ORAL | 0 refills | Status: DC | PRN

## 2019-02-16 NOTE — Progress Notes (Signed)
START ON PATHWAY REGIMEN - Non-Small Cell Lung     Administer weekly:     Paclitaxel      Carboplatin   **Always confirm dose/schedule in your pharmacy ordering system**  Patient Characteristics: Stage IIA/IIB - Unresectable AJCC T Category: T2b Current Disease Status: No Distant Mets or Local Recurrence AJCC N Category: N1 AJCC M Category: M0 AJCC 8 Stage Grouping: IIB Intent of Therapy: Curative Intent, Discussed with Patient

## 2019-02-16 NOTE — Progress Notes (Signed)
[facility] [phone]   [phone] Multidisciplinary thoracic oncology clinic  CONSULT NOTE  REFERRING PHYSICIAN: Dr. Modesto Charon  REASON FOR CONSULTATION:  63 years old white female recently diagnosed with lung cancer.  HPI Tonya Shelton is a 63 y.o. female with past medical history significant for anemia, osteoarthritis, diabetes mellitus, hypertension, hypothyroidism, chronic kidney disease, osteoporosis, sleep apnea as well as TIA 2006 and peripheral neuropathy.  The patient also has long history for smoking but quit 5 years ago.  She was seen by her primary care physician in early March 2024 annual evaluation and physical exam.  Because of her long history for smoking the patient had CT screening of the chest performed on January 04, 2019 and that showed 2.9 x 5.0 cm left upper lobe mass abutting the fissure with left hilar adenopathy.  The patient was referred to Dr. Roxan Hockey and a PET scan was performed on January 19, 2019 and that showed hypermetabolic solid 3.8 x 3.7 cm posterior left upper lobe lung mass with maximum SUV of 12.9.  There was also hypermetabolic 2.3 cm left suprahilar node with maximum SUV of 11.2.  There was also incidental sub-solid 1.9 cm peripheral apical left upper lobe pulmonary nodule that is not hypermetabolic.  There was few small groundglass nodules scattered in the lung the largest measured 1.3 cm in the posterior right upper lobe. On February 09, 2019 the patient underwent bronchoscopy with endobronchial ultrasound and biopsies under the care of Dr. Roxan Hockey. The final pathology of the left upper lobe lung mass ([id number]) was consistent with squamous cell carcinoma.  Dr. Roxan Hockey kindly referred the patient to me today for evaluation and recommendation regarding neoadjuvant therapy.  He felt that the patient may require left pneumonectomy if he proceedes with with surgery at this point. When seen today the  patient is feeling fine except for anxiety about her condition.  She denied having any chest pain, shortness of breath but continues to have mild cough with no hemoptysis.  She has no weight loss or night sweats.  She has no headache but has blurry vision secondary to cataract.  She denied having any nausea, vomiting, diarrhea or constipation. Family history significant for mother with breast cancer at age 58, father died from alcoholic complication, daughter had breast cancer and sister died from COPD. The patient is married and has 2 children son and daughter.  She lives in [place].  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 The patient has a history for smoking up to 1.5 pack/day for 45 years and quit 5 years ago.  She has no history of alcohol abuse but she uses marijuana for pain control.  HPI  Past Medical History:  Diagnosis Date  . Acid reflux   . Anemia    low iron  . Arthritis   . Chronic kidney disease    Stage 3 - Dr. Iona Beard in [place], [place]  . Depression   . Diabetes mellitus without complication ([place])    type 2  . Family history of adverse reaction to anesthesia    mom and sister have n/v  . H/O bladder problems    leaks  . History of hiatal hernia   . Hypertension   . Hypothyroidism   . Neuropathy   . Osteoporosis   . PVC's (premature ventricular contractions)   . Restless legs   . Sleep apnea    uses cpap with oxygen  .  Stroke [facility]) 2007   TIA    Past Surgical History:  Procedure Laterality Date  . BUNIONECTOMY    . GALLBLADDER SURGERY    . LUMBAR LAMINECTOMY/DECOMPRESSION MICRODISCECTOMY N/A 10/15/2017   Procedure: LEFT L5-S1 MICRODISCECTOMY;  Surgeon: [name], MD;  Location: [facility];  Service: Orthopedics;  Laterality: N/A;  . NEUROMA SURGERY    . SPINAL FUSION  2003   cervical  . tonsillectomy    . VIDEO BRONCHOSCOPY WITH RADIAL ENDOBRONCHIAL ULTRASOUND N/A 02/09/2019   Procedure: VIDEO BRONCHOSCOPY WITH RADIAL  ENDOBRONCHIAL ULTRASOUND;  Surgeon: [name], MD;  Location: [facility] OR;  Service: Thoracic;  Laterality: N/A;    Family History  Problem Relation Age of Onset  . Breast cancer Mother   . Endometriosis Mother   . Alcoholism Father   . Lung cancer Neg Hx     Social History Social History   Tobacco Use  . Smoking status: Former Smoker    Packs/day: 1.50    Years: 40.00    Pack years: 60.00    Types: Cigarettes    Last attempt to quit: 10/26/2012    Years since quitting: 6.3  . Smokeless tobacco: Never Used  Substance Use Topics  . Alcohol use: No  . Drug use: No    Allergies  Allergen Reactions  . Erythromycin Other (See Comments)    Eye ointment only:  "Burned retinas"  . Povidone Iodine Other (See Comments)    betadine eye drop caused burning in eyes    Current Outpatient Medications  Medication Sig Dispense Refill  . acetaminophen (TYLENOL) 500 MG tablet Take 1,000-1,500 mg by mouth daily as needed for moderate pain.     Marland Kitchen alendronate (FOSAMAX) 70 MG tablet Take 70 mg by mouth every Sunday.     Marland Kitchen aspirin EC 81 MG tablet Take 81 mg by mouth daily at 12 noon.    Marland Kitchen atorvastatin (LIPITOR) 40 MG tablet Take 40 mg by mouth at bedtime.     . carvedilol (COREG) 25 MG tablet Take 25 mg by mouth 2 (two) times daily with a meal.    . cevimeline (EVOXAC) 30 MG capsule Take 30 mg by mouth 2 (two) times daily.     . DULoxetine (CYMBALTA) 60 MG capsule Take 60 mg by mouth daily.    . ferrous sulfate 325 (65 FE) MG tablet Take 325 mg by mouth daily.   0  . furosemide (LASIX) 20 MG tablet Take 20 mg by mouth daily.  0  . gabapentin (NEURONTIN) 100 MG capsule Take 200 mg by mouth at bedtime.     Marland Kitchen glimepiride (AMARYL) 2 MG tablet Take 2 mg by mouth daily with breakfast.   0  . levothyroxine (SYNTHROID, LEVOTHROID) 88 MCG tablet Take 88 mcg by mouth daily before breakfast.    . Magnesium 500 MG TABS Take 500 mg by mouth daily.    . Misc Natural Products (OSTEO BI-FLEX ADV  TRIPLE ST PO) Take 1 tablet by mouth at bedtime.     . Multiple Vitamins-Minerals (ALIVE WOMENS 50+ PO) Take 1 tablet by mouth daily.    . nitroGLYCERIN (NITROSTAT) 0.4 MG SL tablet Place 0.4 mg under the tongue every 5 (five) minutes as needed for chest pain.    . Omega-3 Fatty Acids (FISH OIL PO) Take 2,000 mg by mouth 2 (two) times daily.    Marland Kitchen omeprazole (PRILOSEC) 20 MG capsule Take 40 mg by mouth daily.     Marland Kitchen OVER THE COUNTER  MEDICATION Place 1-2 tablets under the tongue at bedtime. Restful legs otc    . oxybutynin (DITROPAN-XL) 10 MG 24 hr tablet Take 10 mg by mouth daily.     Marland Kitchen telmisartan (MICARDIS) 80 MG tablet Take 40 mg by mouth daily at 12 noon.     Marland Kitchen tiZANidine (ZANAFLEX) 4 MG tablet Take 8 mg by mouth at bedtime.     . TRADJENTA 5 MG TABS tablet Take 5 mg by mouth daily.  0  . UNABLE TO FIND Med Name: CPAP with 2lpm o2 bled in  [place]    . Vitamin D, Ergocalciferol, (DRISDOL) 50000 units CAPS capsule Take 50,000 Units by mouth every Sunday.      No current facility-administered medications for this visit.     Review of Systems  Constitutional: positive for fatigue Eyes: negative Ears, nose, mouth, throat, and face: negative Respiratory: positive for cough Cardiovascular: negative Gastrointestinal: negative Genitourinary:negative Integument/breast: negative Hematologic/lymphatic: negative Musculoskeletal:negative Neurological: negative Behavioral/Psych: positive for anxiety Endocrine: negative Allergic/Immunologic: negative  Physical Exam  GMW:NUUVO, healthy, no distress, well nourished, well developed and anxious SKIN: skin color, texture, turgor are normal, no rashes or significant lesions HEAD: Normocephalic, No masses, lesions, tenderness or abnormalities EYES: normal, PERRLA, Conjunctiva are pink and non-injected EARS: External ears normal, Canals clear OROPHARYNX:no exudate, no erythema and lips, buccal mucosa, and tongue normal  NECK: supple, no  adenopathy, no JVD LYMPH:  no palpable lymphadenopathy, no hepatosplenomegaly BREAST:not examined LUNGS: clear to auscultation , and palpation HEART: regular rate & rhythm, no murmurs and no gallops ABDOMEN:abdomen soft, non-tender, obese, normal bowel sounds and no masses or organomegaly BACK: Back symmetric, no curvature., No CVA tenderness EXTREMITIES:no joint deformities, effusion, or inflammation, no edema  NEURO: alert & oriented x 3 with fluent speech, no focal motor/sensory deficits  PERFORMANCE STATUS: ECOG 1  LABORATORY DATA: Lab Results  Component Value Date   WBC 12.8 (H) 02/16/2019   HGB 12.9 02/16/2019   HCT 39.5 02/16/2019   MCV 96.8 02/16/2019   PLT 275 02/16/2019      Chemistry      Component Value Date/Time   NA 143 02/16/2019 1436   K 3.2 (L) 02/16/2019 1436   CL 103 02/16/2019 1436   CO2 28 02/16/2019 1436   BUN 10 02/16/2019 1436   CREATININE 1.04 (H) 02/16/2019 1436      Component Value Date/Time   CALCIUM 8.9 02/16/2019 1436   ALKPHOS 119 02/16/2019 1436   AST 16 02/16/2019 1436   ALT 22 02/16/2019 1436   BILITOT 0.4 02/16/2019 1436       RADIOGRAPHIC STUDIES: Dg Chest 2 View  Result Date: 02/09/2019 CLINICAL DATA:  Preop EXAM: CHEST - 2 VIEW COMPARISON:  PET-CT, 01/19/2019 FINDINGS: The heart size is within normal limits. Left upper lobe mass and hilar lymphadenopathy. The visualized skeletal structures are unremarkable. IMPRESSION: Redemonstrated left upper lobe mass and left hilar lymphadenopathy without acute abnormality of the lungs. Electronically Signed   By: Eddie Candle M.D.   On: 02/09/2019 08:16   Nm Pet Image Initial (pi) Skull Base To Thigh  Result Date: 01/19/2019 CLINICAL DATA:  Initial treatment strategy for solitary pulmonary nodule. EXAM: NUCLEAR MEDICINE PET SKULL BASE TO THIGH TECHNIQUE: 10.4 mCi F-18 FDG was injected intravenously. Full-ring PET imaging was performed from the skull base to thigh after the radiotracer. CT  data was obtained and used for attenuation correction and anatomic localization. Fasting blood glucose: 146 mg/dl COMPARISON:  None. FINDINGS: Mediastinal blood pool activity:  SUV max 3.5 NECK: No hypermetabolic lymph nodes in the neck. Incidental CT findings: none CHEST: Hypermetabolic solid 3.8 x 3.7 cm posterior left upper lobe lung mass (series 8/image 17) with max SUV 12.9. Hypermetabolic 2.3 cm left suprahilar node with max SUV 11.2 (series 4/image 60). No additional hypermetabolic pulmonary findings. No hypermetabolic axillary, mediastinal or right hilar nodes. Incidental CT findings: Subsolid 1.9 cm peripheral apical left upper lobe pulmonary nodule (series 8/image 12) is non hypermetabolic. A few small ground-glass pulmonary nodules scattered in the right lung, largest 1.3 cm in the posterior right upper lobe (series 8/image 15). Coronary atherosclerosis. Atherosclerotic nonaneurysmal thoracic aorta. ABDOMEN/PELVIS: No abnormal hypermetabolic activity within the liver, pancreas, adrenal glands, or spleen. No hypermetabolic lymph nodes in the abdomen or pelvis. Incidental CT findings: Cholecystectomy. Tiny granulomatous left liver lobe calcifications. Minimal sigmoid diverticulosis. Bilateral adnexal cysts measuring 4.5 cm on the right (series 4/image 161) and 2.9 cm on the left (series 4/image 162). Atherosclerotic nonaneurysmal abdominal aorta. SKELETON: No focal hypermetabolic activity to suggest skeletal metastasis. Incidental CT findings: none IMPRESSION: 1. Hypermetabolic (max SUV 30.0) solid 3.8 cm posterior left upper lobe lung mass, compatible with primary bronchogenic carcinoma. 2. Hypermetabolic left suprahilar adenopathy. 3. No hypermetabolic distant metastatic disease. 4. PET-CT stage IIB (T2a N1 M0). 5. Additional non hypermetabolic subsolid and ground-glass pulmonary nodules in both lungs, for which attention on follow-up chest CT is advised. 6. Bilateral non hypermetabolic adnexal cysts,  largest 4.5 cm on the right. Suggest attention on follow-up pelvic ultrasound in 3-6 months. 7.  Aortic Atherosclerosis (ICD10-I70.0). Electronically Signed   By: Ilona Sorrel M.D.   On: 01/19/2019 11:45   Dg C-arm Bronchoscopy  Result Date: 02/09/2019 C-ARM BRONCHOSCOPY: Fluoroscopy was utilized by the requesting physician.  No radiographic interpretation.    ASSESSMENT: This is a very pleasant 63 years old white female recently diagnosed with a stage IIb (T2b, N1, M0) non-small cell lung cancer, squamous cell carcinoma presented with right upper lobe lung mass in addition to left hilar adenopathy.  There was also a suspicious groundglass opacity bilaterally.   PLAN: I had a lengthy discussion with the patient today about her current disease stage, prognosis and treatment options.  The patient was seen by Dr. Roxan Hockey and he is concerned about the need for total left pneumonectomy if proceeding with surgery right now. I personally and independently reviewed the scans and discussed the results with the patient today. I recommended for the patient to complete the staging work-up by ordering MRI of the brain to rule out brain metastasis. I also discussed with the patient her treatment options and recommended for her a short course of neoadjuvant concurrent chemoradiation for 5 weeks with weekly carboplatin for AUC of 2 and paclitaxel 45 mg/M2.  I discussed with the patient the adverse effect of the chemotherapy including but not limited to alopecia, myelosuppression, nausea and vomiting, peripheral neuropathy, liver or renal dysfunction. She is expected to start the first cycle of her immunotherapy on Feb 27, 2019. I referred the patient to Dr. Danny Lawless in [place] for evaluation and consideration of the concurrent radiotherapy closer to home. The patient will have a chemotherapy education class before the first dose of her chemotherapy. I will call her pharmacy with prescription for  Compazine 10 mg p.o. every 6 hours as needed for nausea. The patient will come back for follow-up visit 1 week after her first cycle of the treatment for evaluation and management of any adverse effect of her treatment. She  was advised to call immediately if she has any concerning symptoms in the interval. The patient is in agreement with the current plan. The patient voices understanding of current disease status and treatment options and is in agreement with the current care plan.  All questions were answered. The patient knows to call the clinic with any problems, questions or concerns. We can certainly see the patient much sooner if necessary.  Thank you so much for allowing me to participate in the care of Tonya Shelton. I will continue to follow up the patient with you and assist in her care.  I spent 55 minutes counseling the patient face to face. The total time spent in the appointment was 80 minutes.  Disclaimer: This note was dictated with voice recognition software. Similar sounding words can inadvertently be transcribed and may not be corrected upon review.   Eilleen Kempf February 16, 2019, 3:16 PM

## 2019-02-16 NOTE — Progress Notes (Signed)
The proposed treatment discussed in cancer conference 02/16/2019 is for discussion purpose only and is not a binding recommendation.  The patient was not physically examined nor present for their treatment options.  Therefore, final treatment plans cannot be decided.

## 2019-02-16 NOTE — Progress Notes (Signed)
Faxed referral with confirmation fax obtained

## 2019-02-16 NOTE — Progress Notes (Signed)
Oncology Nurse Navigator Documentation  Oncology Nurse Navigator Flowsheets 02/16/2019  Navigator Location [facility]  Referral date to RadOnc/MedOnc -  Navigator Encounter Type Clinic/MDC/I spoke with patient today at thoracic clinic.  I gave and explained information on Dx and TX.  Per Dr. Julien Nordmann, he would like patient to get XRT in [place] with Dr. Danny Lawless.  I called [facility] facility and was updated on needed information for referral.    Telephone -  [facility] Date 02/16/2019  Patient Visit Type MedOnc  Treatment Phase Pre-Tx/Tx Discussion  Barriers/Navigation Needs Education;Coordination of Care  Education Understanding Cancer/ Treatment Options;Newly Diagnosed Cancer Education;Other  Interventions Coordination of Care;Education  Coordination of Care Other  Education Method Verbal;Written  Acuity Level 3  Time Spent with Patient 60

## 2019-02-16 NOTE — Progress Notes (Signed)
      [address]       [place] [address]             [phone]      Patient ID: Tonya Shelton, female   DOB: 30-Nov-1955, 63 y.o.   MRN: [id number]   Mrs Rahrig returns to discuss results of bronch/ EBUS  She is a 63 yo woman with a history of tobacco abuse, obesity, type II DM, fibromyalgia, hypertension, hyperlipidemia, hypothyroidism, OSA and TIA. Had a low dose screening CT which revealed a left upper lobe mass with hilar adenopathy. PET CT showed the mass and hilar nodes were positive. No evidence of distant disease but there are several small nodules that are below limit of PET resolution.  Clinical stage IIB   I did a bronch/ EBUS on 02/09/2019. There was a mucosal abnormality near the takeoff of the left upper lobe bronchus. 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   She did not have any adverse events related to bronch  She is not resectable with a lobectomy and a poor candidate for pneumonectomy, particularly in light of smaller bilateral nodules.   Discussed options for treatment including chemoradiation and neoadjuvant chemo. I would favor neoadjuvant chemo followed by resection if there is a response.  She will meet with Drs. Mohamed and Stella today.    Revonda Standard Roxan Hockey, MD [facility] [phone]

## 2019-02-20 ENCOUNTER — Telehealth: Payer: Self-pay | Admitting: *Deleted

## 2019-02-20 NOTE — Telephone Encounter (Signed)
Oncology Nurse Navigator Documentation  Oncology Nurse Navigator Flowsheets 02/20/2019  Navigator Location [facility]  Referral date to RadOnc/MedOnc -  Navigator Encounter Type Telephone/I received a vm message from Ms. Grandville Silos.  I clarified her schedule.  She has rad onc consult at cancer center in [place] on 03/02/2019.  She verbalized understanding of schedule.   Telephone Incoming Call;Outgoing Call  [facility] Date -  Patient Visit Type -  Treatment Phase Pre-Tx/Tx Discussion  Barriers/Navigation Needs Education  Education Other  Interventions Education  Coordination of Care Other  Education Method Verbal  Acuity Level 2  Time Spent with Patient 30

## 2019-02-20 NOTE — Telephone Encounter (Signed)
Oncology Nurse Navigator Documentation  Oncology Nurse Navigator Flowsheets 02/20/2019  Navigator Location [facility]  Referral date to RadOnc/MedOnc -  Navigator Encounter Type Telephone/I checked on patient's appt with [facility] in [place] for [place].  She does not have one yet but they will call me when she does have an appt.  I was updated by them that she has an appt with Med Onc in [place].  I called Ms. Grandville Silos to get clarification on appt and treatment.  I was unable to reach her but did leave a vm message for her to call me.   Telephone Outgoing Call  [facility] Date -  Patient Visit Type -  Treatment Phase Pre-Tx/Tx Discussion  Barriers/Navigation Needs Education;Coordination of Care  Education Other  Interventions Coordination of Care;Education  Coordination of Care Other  Education Method Verbal  Acuity Level 3  Time Spent with Patient 14

## 2019-02-22 ENCOUNTER — Other Ambulatory Visit: Payer: Self-pay | Admitting: Internal Medicine

## 2019-02-23 ENCOUNTER — Telehealth: Payer: Self-pay | Admitting: *Deleted

## 2019-02-23 ENCOUNTER — Inpatient Hospital Stay: Payer: Medicare Other

## 2019-02-27 ENCOUNTER — Encounter: Payer: Self-pay | Admitting: Internal Medicine

## 2019-02-27 ENCOUNTER — Other Ambulatory Visit: Payer: Medicare Other

## 2019-02-27 NOTE — Progress Notes (Signed)
Called pt to introduce myself as her Arboriculturist.  Unfortunately there aren't any foundations offering copay assistance for her Dx and the type of ins she has.  I informed her of the [employer], went over what it covers and gave her the income requirement.  She stated they exceed the income requirement so she doesn't qualify for the grant at this time.  I will give her my card on 02/28/19 for any questions or concerns she may have in the future.

## 2019-02-28 ENCOUNTER — Other Ambulatory Visit: Payer: Self-pay

## 2019-02-28 ENCOUNTER — Other Ambulatory Visit: Payer: Self-pay | Admitting: Internal Medicine

## 2019-02-28 ENCOUNTER — Inpatient Hospital Stay: Payer: Medicare Other

## 2019-02-28 ENCOUNTER — Other Ambulatory Visit: Payer: Self-pay | Admitting: Medical Oncology

## 2019-02-28 ENCOUNTER — Inpatient Hospital Stay: Payer: Medicare Other | Attending: Internal Medicine

## 2019-02-28 VITALS — BP 135/58 | HR 65 | Temp 98.6°F | Resp 20

## 2019-02-28 DIAGNOSIS — C3412 Malignant neoplasm of upper lobe, left bronchus or lung: Secondary | ICD-10-CM | POA: Insufficient documentation

## 2019-02-28 DIAGNOSIS — Z5111 Encounter for antineoplastic chemotherapy: Secondary | ICD-10-CM | POA: Insufficient documentation

## 2019-02-28 DIAGNOSIS — C3492 Malignant neoplasm of unspecified part of left bronchus or lung: Secondary | ICD-10-CM

## 2019-02-28 DIAGNOSIS — I878 Other specified disorders of veins: Secondary | ICD-10-CM

## 2019-02-28 LAB — CBC WITH DIFFERENTIAL (CANCER CENTER ONLY)
Abs Immature Granulocytes: 0.03 10*3/uL (ref 0.00–0.07)
Basophils Absolute: 0 10*3/uL (ref 0.0–0.1)
Basophils Relative: 0 %
Eosinophils Absolute: 0.1 10*3/uL (ref 0.0–0.5)
Eosinophils Relative: 2 %
HCT: 38.4 % (ref 36.0–46.0)
Hemoglobin: 12.7 g/dL (ref 12.0–15.0)
Immature Granulocytes: 0 %
Lymphocytes Relative: 30 %
Lymphs Abs: 2.7 10*3/uL (ref 0.7–4.0)
MCH: 31.8 pg (ref 26.0–34.0)
MCHC: 33.1 g/dL (ref 30.0–36.0)
MCV: 96.2 fL (ref 80.0–100.0)
Monocytes Absolute: 0.6 10*3/uL (ref 0.1–1.0)
Monocytes Relative: 6 %
Neutro Abs: 5.6 10*3/uL (ref 1.7–7.7)
Neutrophils Relative %: 62 %
Platelet Count: 252 10*3/uL (ref 150–400)
RBC: 3.99 MIL/uL (ref 3.87–5.11)
RDW: 14.1 % (ref 11.5–15.5)
WBC Count: 9.1 10*3/uL (ref 4.0–10.5)
nRBC: 0 % (ref 0.0–0.2)

## 2019-02-28 LAB — CMP (CANCER CENTER ONLY)
ALT: 21 U/L (ref 0–44)
AST: 15 U/L (ref 15–41)
Albumin: 3.2 g/dL — ABNORMAL LOW (ref 3.5–5.0)
Alkaline Phosphatase: 103 U/L (ref 38–126)
Anion gap: 11 (ref 5–15)
BUN: 12 mg/dL (ref 8–23)
CO2: 27 mmol/L (ref 22–32)
Calcium: 8.6 mg/dL — ABNORMAL LOW (ref 8.9–10.3)
Chloride: 104 mmol/L (ref 98–111)
Creatinine: 1.09 mg/dL — ABNORMAL HIGH (ref 0.44–1.00)
GFR, Est AFR Am: >60 mL/min (ref >60)
GFR, Estimated: 53 mL/min — ABNORMAL LOW (ref >60)
Glucose, Bld: 159 mg/dL — ABNORMAL HIGH (ref 70–99)
Potassium: 3.2 mmol/L — ABNORMAL LOW (ref 3.5–5.1)
Sodium: 142 mmol/L (ref 135–145)
Total Bilirubin: 0.4 mg/dL (ref 0.3–1.2)
Total Protein: 6.7 g/dL (ref 6.5–8.1)

## 2019-02-28 MED ORDER — PALONOSETRON HCL INJECTION 0.25 MG/5ML
INTRAVENOUS | Status: AC
  Filled 2019-02-28: qty 5

## 2019-02-28 MED ORDER — FAMOTIDINE IN NACL 20-0.9 MG/50ML-% IV SOLN
INTRAVENOUS | Status: AC
  Filled 2019-02-28: qty 50

## 2019-02-28 MED ORDER — DIPHENHYDRAMINE HCL 50 MG/ML IJ SOLN
INTRAMUSCULAR | Status: AC
  Filled 2019-02-28: qty 1

## 2019-02-28 MED ORDER — PALONOSETRON HCL INJECTION 0.25 MG/5ML
0.2500 mg | Freq: Once | INTRAVENOUS | Status: AC
  Administered 2019-02-28: 0.25 mg via INTRAVENOUS

## 2019-02-28 MED ORDER — FAMOTIDINE IN NACL 20-0.9 MG/50ML-% IV SOLN
20.0000 mg | Freq: Once | INTRAVENOUS | Status: AC
  Administered 2019-02-28: 20 mg via INTRAVENOUS

## 2019-02-28 MED ORDER — ACETAMINOPHEN 325 MG PO TABS
ORAL_TABLET | ORAL | Status: AC
  Filled 2019-02-28: qty 2

## 2019-02-28 MED ORDER — SODIUM CHLORIDE 0.9 % IV SOLN
20.0000 mg | Freq: Once | INTRAVENOUS | Status: AC
  Administered 2019-02-28: 20 mg via INTRAVENOUS
  Filled 2019-02-28: qty 20

## 2019-02-28 MED ORDER — SODIUM CHLORIDE 0.9 % IV SOLN
212.4000 mg | Freq: Once | INTRAVENOUS | Status: AC
  Administered 2019-02-28: 210 mg via INTRAVENOUS
  Filled 2019-02-28: qty 21

## 2019-02-28 MED ORDER — DIPHENHYDRAMINE HCL 50 MG/ML IJ SOLN
50.0000 mg | Freq: Once | INTRAMUSCULAR | Status: AC
  Administered 2019-02-28: 50 mg via INTRAVENOUS

## 2019-02-28 MED ORDER — SODIUM CHLORIDE 0.9 % IV SOLN
Freq: Once | INTRAVENOUS | Status: AC
  Administered 2019-02-28: 09:00:00 via INTRAVENOUS
  Filled 2019-02-28: qty 250

## 2019-02-28 MED ORDER — SODIUM CHLORIDE 0.9 % IV SOLN
45.0000 mg/m2 | Freq: Once | INTRAVENOUS | Status: AC
  Administered 2019-02-28: 90 mg via INTRAVENOUS
  Filled 2019-02-28: qty 15

## 2019-02-28 NOTE — Patient Instructions (Signed)
[facility] Discharge Instructions for Patients Receiving Chemotherapy  Today you received the following chemotherapy agents taxol and carboplatin  To help prevent nausea and vomiting after your treatment, we encourage you to take your nausea medication as directed  If you develop nausea and vomiting that is not controlled by your nausea medication, call the clinic.   BELOW ARE SYMPTOMS THAT SHOULD BE REPORTED IMMEDIATELY:  *FEVER GREATER THAN 100.5 F  *CHILLS WITH OR WITHOUT FEVER  NAUSEA AND VOMITING THAT IS NOT CONTROLLED WITH YOUR NAUSEA MEDICATION  *UNUSUAL SHORTNESS OF BREATH  *UNUSUAL BRUISING OR BLEEDING  TENDERNESS IN MOUTH AND THROAT WITH OR WITHOUT PRESENCE OF ULCERS  *URINARY PROBLEMS  *BOWEL PROBLEMS  UNUSUAL RASH Items with * indicate a potential emergency and should be followed up as soon as possible.  Feel free to call the clinic should you have any questions or concerns. The clinic phone number is [phone].  Please show the [facility] at check-in to the Emergency Department and triage nurse.  Paclitaxel injection (Taxol) What is this medicine? PACLITAXEL (PAK li TAX el) is a chemotherapy drug. It targets fast dividing cells, like cancer cells, and causes these cells to die. This medicine is used to treat ovarian cancer, breast cancer, lung cancer, Kaposi's sarcoma, and other cancers. This medicine may be used for other purposes; ask your health care provider or pharmacist if you have questions. COMMON BRAND NAME(S): Onxol, Taxol What should I tell my health care provider before I take this medicine? They need to know if you have any of these conditions: -history of irregular heartbeat -liver disease -low blood counts, like low white cell, platelet, or red cell counts -lung or breathing disease, like asthma -tingling of the fingers or toes, or other nerve disorder -an unusual or allergic reaction to paclitaxel, alcohol,  polyoxyethylated castor oil, other chemotherapy, other medicines, foods, dyes, or preservatives -pregnant or trying to get pregnant -breast-feeding How should I use this medicine? This drug is given as an infusion into a vein. It is administered in a hospital or clinic by a specially trained health care professional. Talk to your pediatrician regarding the use of this medicine in children. Special care may be needed. Overdosage: If you think you have taken too much of this medicine contact a poison control center or emergency room at once. NOTE: This medicine is only for you. Do not share this medicine with others. What if I miss a dose? It is important not to miss your dose. Call your doctor or health care professional if you are unable to keep an appointment. What may interact with this medicine? Do not take this medicine with any of the following medications: -disulfiram -metronidazole This medicine may also interact with the following medications: -antiviral medicines for hepatitis, HIV or AIDS -certain antibiotics like erythromycin and clarithromycin -certain medicines for fungal infections like ketoconazole and itraconazole -certain medicines for seizures like carbamazepine, phenobarbital, phenytoin -gemfibrozil -nefazodone -rifampin -St. John's wort This list may not describe all possible interactions. Give your health care provider a list of all the medicines, herbs, non-prescription drugs, or dietary supplements you use. Also tell them if you smoke, drink alcohol, or use illegal drugs. Some items may interact with your medicine. What should I watch for while using this medicine? Your condition will be monitored carefully while you are receiving this medicine. You will need important blood work done while you are taking this medicine. This medicine can cause serious allergic reactions. To reduce  your risk you will need to take other medicine(s) before treatment with this medicine.  If you experience allergic reactions like skin rash, itching or hives, swelling of the face, lips, or tongue, tell your doctor or health care professional right away. In some cases, you may be given additional medicines to help with side effects. Follow all directions for their use. This drug may make you feel generally unwell. This is not uncommon, as chemotherapy can affect healthy cells as well as cancer cells. Report any side effects. Continue your course of treatment even though you feel ill unless your doctor tells you to stop. Call your doctor or health care professional for advice if you get a fever, chills or sore throat, or other symptoms of a cold or flu. Do not treat yourself. This drug decreases your body's ability to fight infections. Try to avoid being around people who are sick. This medicine may increase your risk to bruise or bleed. Call your doctor or health care professional if you notice any unusual bleeding. Be careful brushing and flossing your teeth or using a toothpick because you may get an infection or bleed more easily. If you have any dental work done, tell your dentist you are receiving this medicine. Avoid taking products that contain aspirin, acetaminophen, ibuprofen, naproxen, or ketoprofen unless instructed by your doctor. These medicines may hide a fever. Do not become pregnant while taking this medicine. Women should inform their doctor if they wish to become pregnant or think they might be pregnant. There is a potential for serious side effects to an unborn child. Talk to your health care professional or pharmacist for more information. Do not breast-feed an infant while taking this medicine. Men are advised not to father a child while receiving this medicine. This product may contain alcohol. Ask your pharmacist or healthcare provider if this medicine contains alcohol. Be sure to tell all healthcare providers you are taking this medicine. Certain medicines, like  metronidazole and disulfiram, can cause an unpleasant reaction when taken with alcohol. The reaction includes flushing, headache, nausea, vomiting, sweating, and increased thirst. The reaction can last from 30 minutes to several hours. What side effects may I notice from receiving this medicine? Side effects that you should report to your doctor or health care professional as soon as possible: -allergic reactions like skin rash, itching or hives, swelling of the face, lips, or tongue -breathing problems -changes in vision -fast, irregular heartbeat -high or low blood pressure -mouth sores -pain, tingling, numbness in the hands or feet -signs of decreased platelets or bleeding - bruising, pinpoint red spots on the skin, black, tarry stools, blood in the urine -signs of decreased red blood cells - unusually weak or tired, feeling faint or lightheaded, falls -signs of infection - fever or chills, cough, sore throat, pain or difficulty passing urine -signs and symptoms of liver injury like dark yellow or brown urine; general ill feeling or flu-like symptoms; light-colored stools; loss of appetite; nausea; right upper belly pain; unusually weak or tired; yellowing of the eyes or skin -swelling of the ankles, feet, hands -unusually slow heartbeat Side effects that usually do not require medical attention (report to your doctor or health care professional if they continue or are bothersome): -diarrhea -hair loss -loss of appetite -muscle or joint pain -nausea, vomiting -pain, redness, or irritation at site where injected -tiredness This list may not describe all possible side effects. Call your doctor for medical advice about side effects. You may report side effects  to FDA at 1-800-FDA-1088. Where should I keep my medicine? This drug is given in a hospital or clinic and will not be stored at home. NOTE: This sheet is a summary. It may not cover all possible information. If you have questions  about this medicine, talk to your doctor, pharmacist, or health care provider.  2019 Elsevier/Gold Standard (2017-06-15 13:14:55)   Carboplatin injection What is this medicine? CARBOPLATIN (KAR boe pla tin) is a chemotherapy drug. It targets fast dividing cells, like cancer cells, and causes these cells to die. This medicine is used to treat ovarian cancer and many other cancers. This medicine may be used for other purposes; ask your health care provider or pharmacist if you have questions. COMMON BRAND NAME(S): Paraplatin What should I tell my health care provider before I take this medicine? They need to know if you have any of these conditions: -blood disorders -hearing problems -kidney disease -recent or ongoing radiation therapy -an unusual or allergic reaction to carboplatin, cisplatin, other chemotherapy, other medicines, foods, dyes, or preservatives -pregnant or trying to get pregnant -breast-feeding How should I use this medicine? This drug is usually given as an infusion into a vein. It is administered in a hospital or clinic by a specially trained health care professional. Talk to your pediatrician regarding the use of this medicine in children. Special care may be needed. Overdosage: If you think you have taken too much of this medicine contact a poison control center or emergency room at once. NOTE: This medicine is only for you. Do not share this medicine with others. What if I miss a dose? It is important not to miss a dose. Call your doctor or health care professional if you are unable to keep an appointment. What may interact with this medicine? -medicines for seizures -medicines to increase blood counts like filgrastim, pegfilgrastim, sargramostim -some antibiotics like amikacin, gentamicin, neomycin, streptomycin, tobramycin -vaccines Talk to your doctor or health care professional before taking any of these medicines: -acetaminophen -aspirin -ibuprofen  -ketoprofen -naproxen This list may not describe all possible interactions. Give your health care provider a list of all the medicines, herbs, non-prescription drugs, or dietary supplements you use. Also tell them if you smoke, drink alcohol, or use illegal drugs. Some items may interact with your medicine. What should I watch for while using this medicine? Your condition will be monitored carefully while you are receiving this medicine. You will need important blood work done while you are taking this medicine. This drug may make you feel generally unwell. This is not uncommon, as chemotherapy can affect healthy cells as well as cancer cells. Report any side effects. Continue your course of treatment even though you feel ill unless your doctor tells you to stop. In some cases, you may be given additional medicines to help with side effects. Follow all directions for their use. Call your doctor or health care professional for advice if you get a fever, chills or sore throat, or other symptoms of a cold or flu. Do not treat yourself. This drug decreases your body's ability to fight infections. Try to avoid being around people who are sick. This medicine may increase your risk to bruise or bleed. Call your doctor or health care professional if you notice any unusual bleeding. Be careful brushing and flossing your teeth or using a toothpick because you may get an infection or bleed more easily. If you have any dental work done, tell your dentist you are receiving this medicine. Avoid taking  products that contain aspirin, acetaminophen, ibuprofen, naproxen, or ketoprofen unless instructed by your doctor. These medicines may hide a fever. Do not become pregnant while taking this medicine. Women should inform their doctor if they wish to become pregnant or think they might be pregnant. There is a potential for serious side effects to an unborn child. Talk to your health care professional or pharmacist for more  information. Do not breast-feed an infant while taking this medicine. What side effects may I notice from receiving this medicine? Side effects that you should report to your doctor or health care professional as soon as possible: -allergic reactions like skin rash, itching or hives, swelling of the face, lips, or tongue -signs of infection - fever or chills, cough, sore throat, pain or difficulty passing urine -signs of decreased platelets or bleeding - bruising, pinpoint red spots on the skin, black, tarry stools, nosebleeds -signs of decreased red blood cells - unusually weak or tired, fainting spells, lightheadedness -breathing problems -changes in hearing -changes in vision -chest pain -high blood pressure -low blood counts - This drug may decrease the number of white blood cells, red blood cells and platelets. You may be at increased risk for infections and bleeding. -nausea and vomiting -pain, swelling, redness or irritation at the injection site -pain, tingling, numbness in the hands or feet -problems with balance, talking, walking -trouble passing urine or change in the amount of urine Side effects that usually do not require medical attention (report to your doctor or health care professional if they continue or are bothersome): -hair loss -loss of appetite -metallic taste in the mouth or changes in taste This list may not describe all possible side effects. Call your doctor for medical advice about side effects. You may report side effects to FDA at 1-800-FDA-1088. Where should I keep my medicine? This drug is given in a hospital or clinic and will not be stored at home. NOTE: This sheet is a summary. It may not cover all possible information. If you have questions about this medicine, talk to your doctor, pharmacist, or health care provider.  2019 Elsevier/Gold Standard (2008-01-17 14:38:05)    Hypokalemia (low potassium) Hypokalemia means that the amount of potassium in  the blood is lower than normal.Potassium is a chemical that helps regulate the amount of fluid in the body (electrolyte). It also stimulates muscle tightening (contraction) and helps nerves work properly.Normally, most of the body's potassium is inside of cells, and only a very small amount is in the blood. Because the amount in the blood is so small, minor changes to potassium levels in the blood can be life-threatening. What are the causes? This condition may be caused by:  Antibiotic medicine.  Diarrhea or vomiting. Taking too much of a medicine that helps you have a bowel movement (laxative) can cause diarrhea and lead to hypokalemia.  Chronic kidney disease (CKD).  Medicines that help the body get rid of excess fluid (diuretics).  Eating disorders, such as bulimia.  Low magnesium levels in the body.  Sweating a lot. What are the signs or symptoms? Symptoms of this condition include:  Weakness.  Constipation.  Fatigue.  Muscle cramps.  Mental confusion.  Skipped heartbeats or irregular heartbeat (palpitations).  Tingling or numbness. How is this diagnosed? This condition is diagnosed with a blood test. How is this treated? Hypokalemia can be treated by taking potassium supplements by mouth or adjusting the medicines that you take. Treatment may also include eating more foods that contain a lot  of potassium. If your potassium level is very low, you may need to get potassium through an IV tube in one of your veins and be monitored in the hospital. Follow these instructions at home:   Take over-the-counter and prescription medicines only as told by your health care provider. This includes vitamins and supplements.  Eat a healthy diet. A healthy diet includes fresh fruits and vegetables, whole grains, healthy fats, and lean proteins.  If instructed, eat more foods that contain a lot of potassium, such as: ? Nuts, such as peanuts and pistachios. ? Seeds, such as  sunflower seeds and pumpkin seeds. ? Peas, lentils, and lima beans. ? Whole grain and bran cereals and breads. ? Fresh fruits and vegetables, such as apricots, avocado, bananas, cantaloupe, kiwi, oranges, tomatoes, asparagus, and potatoes. ? Orange juice. ? Tomato juice. ? Red meats. ? Yogurt.  Keep all follow-up visits as told by your health care provider. This is important. Contact a health care provider if:  You have weakness that gets worse.  You feel your heart pounding or racing.  You vomit.  You have diarrhea.  You have diabetes (diabetes mellitus) and you have trouble keeping your blood sugar (glucose) in your target range. Get help right away if:  You have chest pain.  You have shortness of breath.  You have vomiting or diarrhea that lasts for more than 2 days.  You faint. This information is not intended to replace advice given to you by your health care provider. Make sure you discuss any questions you have with your health care provider. Document Released: 10/12/2005 Document Revised: 05/30/2016 Document Reviewed: 05/30/2016 Elsevier Interactive Patient Education  2019 [employer].

## 2019-03-01 ENCOUNTER — Telehealth: Payer: Self-pay | Admitting: *Deleted

## 2019-03-01 NOTE — Telephone Encounter (Signed)
-----   Message from Royston Bake, RN sent at 03/01/2019  8:54 AM EDT ----- Regarding: Dr. Julien Nordmann first treatment patient Taxol [place] Dr. Julien Nordmann patient first infusion taxol Norma Fredrickson

## 2019-03-01 NOTE — Telephone Encounter (Signed)
Called Tonya Shelton for chemotherapy F/U.  Patient is doing well.  Denies n/v.  Denies any new side effects or symptoms.  Bowel and bladder functioning well.  Eating and drinking well.  Instructed to drink 64 oz minimum daily or at least the day before, of and after treatment.   Denies questions or needs at this time.  Encouraged to call [phone] Mon -Fri 8:00 am - 4:30 pm or anytime as needed for symptoms, changes or event outside office hours.

## 2019-03-02 ENCOUNTER — Telehealth: Payer: Self-pay | Admitting: Internal Medicine

## 2019-03-02 NOTE — Telephone Encounter (Signed)
Returned call to [place] @ [place] aware Dr. Roxan Hockey was ordering provider for PFT Dr.Young read. Provided with his office number as well as fax # to [facility]. Nothing further needed.

## 2019-03-03 ENCOUNTER — Other Ambulatory Visit: Payer: Self-pay

## 2019-03-03 ENCOUNTER — Ambulatory Visit
Admission: RE | Admit: 2019-03-03 | Discharge: 2019-03-03 | Disposition: A | Discharge status: Home / Self Care | Payer: Medicare Other | Source: Ambulatory Visit | Attending: Internal Medicine | Admitting: Internal Medicine

## 2019-03-03 DIAGNOSIS — C3490 Malignant neoplasm of unspecified part of unspecified bronchus or lung: Secondary | ICD-10-CM

## 2019-03-03 MED ORDER — GADOBENATE DIMEGLUMINE 529 MG/ML IV SOLN
19.0000 mL | Freq: Once | INTRAVENOUS | Status: AC | PRN
  Administered 2019-03-03: 19 mL via INTRAVENOUS

## 2019-03-06 ENCOUNTER — Other Ambulatory Visit: Payer: Self-pay

## 2019-03-06 ENCOUNTER — Inpatient Hospital Stay: Payer: Medicare Other

## 2019-03-06 ENCOUNTER — Other Ambulatory Visit: Payer: Self-pay | Admitting: Radiology

## 2019-03-06 ENCOUNTER — Other Ambulatory Visit: Payer: Self-pay | Admitting: Internal Medicine

## 2019-03-06 VITALS — BP 109/52 | HR 76 | Temp 98.7°F | Resp 18 | Ht 62.0 in | Wt 204.8 lb

## 2019-03-06 DIAGNOSIS — Z5111 Encounter for antineoplastic chemotherapy: Secondary | ICD-10-CM | POA: Diagnosis not present

## 2019-03-06 DIAGNOSIS — C3492 Malignant neoplasm of unspecified part of left bronchus or lung: Secondary | ICD-10-CM

## 2019-03-06 LAB — CMP (CANCER CENTER ONLY)
ALT: 25 U/L (ref 0–44)
AST: 14 U/L — ABNORMAL LOW (ref 15–41)
Albumin: 3.2 g/dL — ABNORMAL LOW (ref 3.5–5.0)
Alkaline Phosphatase: 118 U/L (ref 38–126)
Anion gap: 12 (ref 5–15)
BUN: 17 mg/dL (ref 8–23)
CO2: 26 mmol/L (ref 22–32)
Calcium: 8.8 mg/dL — ABNORMAL LOW (ref 8.9–10.3)
Chloride: 102 mmol/L (ref 98–111)
Creatinine: 1.21 mg/dL — ABNORMAL HIGH (ref 0.44–1.00)
GFR, Est AFR Am: 54 mL/min — ABNORMAL LOW (ref >60)
GFR, Estimated: 47 mL/min — ABNORMAL LOW (ref >60)
Glucose, Bld: 191 mg/dL — ABNORMAL HIGH (ref 70–99)
Potassium: 3.4 mmol/L — ABNORMAL LOW (ref 3.5–5.1)
Sodium: 140 mmol/L (ref 135–145)
Total Bilirubin: 0.3 mg/dL (ref 0.3–1.2)
Total Protein: 6.8 g/dL (ref 6.5–8.1)

## 2019-03-06 LAB — CBC WITH DIFFERENTIAL (CANCER CENTER ONLY)
Abs Immature Granulocytes: 0.07 10*3/uL (ref 0.00–0.07)
Basophils Absolute: 0 10*3/uL (ref 0.0–0.1)
Basophils Relative: 0 %
Eosinophils Absolute: 0.2 10*3/uL (ref 0.0–0.5)
Eosinophils Relative: 2 %
HCT: 37.3 % (ref 36.0–46.0)
Hemoglobin: 12.4 g/dL (ref 12.0–15.0)
Immature Granulocytes: 1 %
Lymphocytes Relative: 29 %
Lymphs Abs: 3.2 10*3/uL (ref 0.7–4.0)
MCH: 31.7 pg (ref 26.0–34.0)
MCHC: 33.2 g/dL (ref 30.0–36.0)
MCV: 95.4 fL (ref 80.0–100.0)
Monocytes Absolute: 0.4 10*3/uL (ref 0.1–1.0)
Monocytes Relative: 3 %
Neutro Abs: 7.2 10*3/uL (ref 1.7–7.7)
Neutrophils Relative %: 65 %
Platelet Count: 247 10*3/uL (ref 150–400)
RBC: 3.91 MIL/uL (ref 3.87–5.11)
RDW: 13.8 % (ref 11.5–15.5)
WBC Count: 11 10*3/uL — ABNORMAL HIGH (ref 4.0–10.5)
nRBC: 0 % (ref 0.0–0.2)

## 2019-03-06 MED ORDER — SODIUM CHLORIDE 0.9 % IV SOLN
Freq: Once | INTRAVENOUS | Status: AC
  Administered 2019-03-06: 10:00:00 via INTRAVENOUS
  Filled 2019-03-06: qty 250

## 2019-03-06 MED ORDER — HEPARIN SOD (PORK) LOCK FLUSH 100 UNIT/ML IV SOLN
500.0000 [IU] | Freq: Once | INTRAVENOUS | Status: DC | PRN
  Filled 2019-03-06: qty 5

## 2019-03-06 MED ORDER — SODIUM CHLORIDE 0.9% FLUSH
10.0000 mL | INTRAVENOUS | Status: DC | PRN
  Filled 2019-03-06: qty 10

## 2019-03-06 MED ORDER — PALONOSETRON HCL INJECTION 0.25 MG/5ML
0.2500 mg | Freq: Once | INTRAVENOUS | Status: AC
  Administered 2019-03-06: 0.25 mg via INTRAVENOUS

## 2019-03-06 MED ORDER — FAMOTIDINE IN NACL 20-0.9 MG/50ML-% IV SOLN
INTRAVENOUS | Status: AC
  Filled 2019-03-06: qty 50

## 2019-03-06 MED ORDER — DIPHENHYDRAMINE HCL 50 MG/ML IJ SOLN
50.0000 mg | Freq: Once | INTRAMUSCULAR | Status: AC
  Administered 2019-03-06: 50 mg via INTRAVENOUS

## 2019-03-06 MED ORDER — SODIUM CHLORIDE 0.9 % IV SOLN
212.4000 mg | Freq: Once | INTRAVENOUS | Status: AC
  Administered 2019-03-06: 210 mg via INTRAVENOUS
  Filled 2019-03-06: qty 21

## 2019-03-06 MED ORDER — SODIUM CHLORIDE 0.9 % IV SOLN
20.0000 mg | Freq: Once | INTRAVENOUS | Status: AC
  Administered 2019-03-06: 20 mg via INTRAVENOUS
  Filled 2019-03-06: qty 2

## 2019-03-06 MED ORDER — PALONOSETRON HCL INJECTION 0.25 MG/5ML
INTRAVENOUS | Status: AC
  Filled 2019-03-06: qty 5

## 2019-03-06 MED ORDER — DIPHENHYDRAMINE HCL 50 MG/ML IJ SOLN
INTRAMUSCULAR | Status: AC
  Filled 2019-03-06: qty 1

## 2019-03-06 MED ORDER — FAMOTIDINE IN NACL 20-0.9 MG/50ML-% IV SOLN
20.0000 mg | Freq: Once | INTRAVENOUS | Status: AC
  Administered 2019-03-06: 20 mg via INTRAVENOUS

## 2019-03-06 MED ORDER — SODIUM CHLORIDE 0.9 % IV SOLN
45.0000 mg/m2 | Freq: Once | INTRAVENOUS | Status: AC
  Administered 2019-03-06: 90 mg via INTRAVENOUS
  Filled 2019-03-06: qty 15

## 2019-03-06 NOTE — Progress Notes (Signed)
Clarified Carboplatin dose. MD would like to continue 210 mg dose for now.  Larene Beach, PharmD

## 2019-03-06 NOTE — Patient Instructions (Signed)
[facility] Discharge Instructions for Patients Receiving Chemotherapy  Today you received the following chemotherapy agents:  Taxol, Carboplatin  To help prevent nausea and vomiting after your treatment, we encourage you to take your nausea medication as prescribed.   If you develop nausea and vomiting that is not controlled by your nausea medication, call the clinic.   BELOW ARE SYMPTOMS THAT SHOULD BE REPORTED IMMEDIATELY:  *FEVER GREATER THAN 100.5 F  *CHILLS WITH OR WITHOUT FEVER  NAUSEA AND VOMITING THAT IS NOT CONTROLLED WITH YOUR NAUSEA MEDICATION  *UNUSUAL SHORTNESS OF BREATH  *UNUSUAL BRUISING OR BLEEDING  TENDERNESS IN MOUTH AND THROAT WITH OR WITHOUT PRESENCE OF ULCERS  *URINARY PROBLEMS  *BOWEL PROBLEMS  UNUSUAL RASH Items with * indicate a potential emergency and should be followed up as soon as possible.  Feel free to call the clinic should you have any questions or concerns. The clinic phone number is [phone].  Please show the [facility] at check-in to the Emergency Department and triage nurse.   Coronavirus (COVID-19) Are you at risk?  Are you at risk for the Coronavirus (COVID-19)?  To be considered HIGH RISK for Coronavirus (COVID-19), you have to meet the following criteria:  . Traveled to [place], [place], [place], [place] or [place]; or in the [place] to [place], [place], [place], or [place]; and have fever, cough, and shortness of breath within the last 2 weeks of travel OR . Been in close contact with a person diagnosed with COVID-19 within the last 2 weeks and have fever, cough, and shortness of breath . IF YOU DO NOT MEET THESE CRITERIA, YOU ARE CONSIDERED LOW RISK FOR COVID-19.  What to do if you are HIGH RISK for COVID-19?  Marland Kitchen If you are having a medical emergency, call 911. . Seek medical care right away. Before you go to a doctor's office, urgent care or emergency department, call ahead and tell them  about your recent travel, contact with someone diagnosed with COVID-19, and your symptoms. You should receive instructions from your physician's office regarding next steps of care.  . When you arrive at healthcare provider, tell the healthcare staff immediately you have returned from visiting [place], [place], [place], [place] or [place]; or traveled in the [place] to [place], [place], [place], or [place]; in the last two weeks or you have been in close contact with a person diagnosed with COVID-19 in the last 2 weeks.   . Tell the health care staff about your symptoms: fever, cough and shortness of breath. . After you have been seen by a medical provider, you will be either: o Tested for (COVID-19) and discharged home on quarantine except to seek medical care if symptoms worsen, and asked to  - Stay home and avoid contact with others until you get your results (4-5 days)  - Avoid travel on public transportation if possible (such as bus, train, or airplane) or o Sent to the Emergency Department by EMS for evaluation, COVID-19 testing, and possible admission depending on your condition and test results.  What to do if you are LOW RISK for COVID-19?  Reduce your risk of any infection by using the same precautions used for avoiding the common cold or flu:  Marland Kitchen Wash your hands often with soap and warm water for at least 20 seconds.  If soap and water are not readily available, use an alcohol-based hand sanitizer with at least 60% alcohol.  . If  coughing or sneezing, cover your mouth and nose by coughing or sneezing into the elbow areas of your shirt or coat, into a tissue or into your sleeve (not your hands). . Avoid shaking hands with others and consider head nods or verbal greetings only. . Avoid touching your eyes, nose, or mouth with unwashed hands.  . Avoid close contact with people who are sick. . Avoid places or events with large numbers of people in one location, like concerts or  sporting events. . Carefully consider travel plans you have or are making. . If you are planning any travel outside or inside the [place], visit the CDC's Travelers' Health webpage for the latest health notices. . If you have some symptoms but not all symptoms, continue to monitor at home and seek medical attention if your symptoms worsen. . If you are having a medical emergency, call 911.   [place] / e-Visit: [email or web address]         [facility]: [facility]: [phone]                   [facility]: [phone]

## 2019-03-07 ENCOUNTER — Other Ambulatory Visit (HOSPITAL_COMMUNITY): Payer: Self-pay | Admitting: Specialist

## 2019-03-07 ENCOUNTER — Telehealth: Payer: Self-pay | Admitting: Internal Medicine

## 2019-03-07 ENCOUNTER — Other Ambulatory Visit: Payer: Self-pay

## 2019-03-07 ENCOUNTER — Ambulatory Visit (HOSPITAL_COMMUNITY)
Admission: RE | Admit: 2019-03-07 | Discharge: 2019-03-07 | Disposition: A | Discharge status: Home / Self Care | Payer: Medicare Other | Source: Ambulatory Visit | Attending: Internal Medicine | Admitting: Internal Medicine

## 2019-03-07 ENCOUNTER — Telehealth: Payer: Self-pay | Admitting: Medical Oncology

## 2019-03-07 ENCOUNTER — Other Ambulatory Visit: Payer: Self-pay | Admitting: Internal Medicine

## 2019-03-07 ENCOUNTER — Encounter (HOSPITAL_COMMUNITY): Payer: Self-pay

## 2019-03-07 DIAGNOSIS — I878 Other specified disorders of veins: Secondary | ICD-10-CM | POA: Insufficient documentation

## 2019-03-07 DIAGNOSIS — M810 Age-related osteoporosis without current pathological fracture: Secondary | ICD-10-CM | POA: Insufficient documentation

## 2019-03-07 DIAGNOSIS — E039 Hypothyroidism, unspecified: Secondary | ICD-10-CM | POA: Insufficient documentation

## 2019-03-07 DIAGNOSIS — D649 Anemia, unspecified: Secondary | ICD-10-CM | POA: Insufficient documentation

## 2019-03-07 DIAGNOSIS — Z7982 Long term (current) use of aspirin: Secondary | ICD-10-CM | POA: Insufficient documentation

## 2019-03-07 DIAGNOSIS — G4730 Sleep apnea, unspecified: Secondary | ICD-10-CM | POA: Insufficient documentation

## 2019-03-07 DIAGNOSIS — K219 Gastro-esophageal reflux disease without esophagitis: Secondary | ICD-10-CM | POA: Insufficient documentation

## 2019-03-07 DIAGNOSIS — Z79899 Other long term (current) drug therapy: Secondary | ICD-10-CM | POA: Diagnosis not present

## 2019-03-07 DIAGNOSIS — Z881 Allergy status to other antibiotic agents status: Secondary | ICD-10-CM | POA: Insufficient documentation

## 2019-03-07 DIAGNOSIS — Z79890 Hormone replacement therapy (postmenopausal): Secondary | ICD-10-CM | POA: Insufficient documentation

## 2019-03-07 DIAGNOSIS — N183 Chronic kidney disease, stage 3 (moderate): Secondary | ICD-10-CM | POA: Diagnosis not present

## 2019-03-07 DIAGNOSIS — E1122 Type 2 diabetes mellitus with diabetic chronic kidney disease: Secondary | ICD-10-CM | POA: Insufficient documentation

## 2019-03-07 DIAGNOSIS — E1140 Type 2 diabetes mellitus with diabetic neuropathy, unspecified: Secondary | ICD-10-CM | POA: Insufficient documentation

## 2019-03-07 DIAGNOSIS — M1990 Unspecified osteoarthritis, unspecified site: Secondary | ICD-10-CM | POA: Insufficient documentation

## 2019-03-07 DIAGNOSIS — Z8673 Personal history of transient ischemic attack (TIA), and cerebral infarction without residual deficits: Secondary | ICD-10-CM | POA: Diagnosis not present

## 2019-03-07 DIAGNOSIS — Z87891 Personal history of nicotine dependence: Secondary | ICD-10-CM | POA: Diagnosis not present

## 2019-03-07 DIAGNOSIS — G2581 Restless legs syndrome: Secondary | ICD-10-CM | POA: Insufficient documentation

## 2019-03-07 DIAGNOSIS — I129 Hypertensive chronic kidney disease with stage 1 through stage 4 chronic kidney disease, or unspecified chronic kidney disease: Secondary | ICD-10-CM | POA: Diagnosis not present

## 2019-03-07 DIAGNOSIS — Z803 Family history of malignant neoplasm of breast: Secondary | ICD-10-CM | POA: Insufficient documentation

## 2019-03-07 HISTORY — PX: IR IMAGING GUIDED PORT INSERTION: IMG5740

## 2019-03-07 LAB — CBC WITH DIFFERENTIAL/PLATELET
Abs Immature Granulocytes: 0.12 10*3/uL — ABNORMAL HIGH (ref 0.00–0.07)
Basophils Absolute: 0 10*3/uL (ref 0.0–0.1)
Basophils Relative: 0 %
Eosinophils Absolute: 0 10*3/uL (ref 0.0–0.5)
Eosinophils Relative: 0 %
HCT: 36.2 % (ref 36.0–46.0)
Hemoglobin: 12.2 g/dL (ref 12.0–15.0)
Immature Granulocytes: 1 %
Lymphocytes Relative: 18 %
Lymphs Abs: 2.7 10*3/uL (ref 0.7–4.0)
MCH: 32.1 pg (ref 26.0–34.0)
MCHC: 33.7 g/dL (ref 30.0–36.0)
MCV: 95.3 fL (ref 80.0–100.0)
Monocytes Absolute: 0.4 10*3/uL (ref 0.1–1.0)
Monocytes Relative: 3 %
Neutro Abs: 12.2 10*3/uL — ABNORMAL HIGH (ref 1.7–7.7)
Neutrophils Relative %: 78 %
Platelets: 308 10*3/uL (ref 150–400)
RBC: 3.8 MIL/uL — ABNORMAL LOW (ref 3.87–5.11)
RDW: 13.8 % (ref 11.5–15.5)
WBC: 15.4 10*3/uL — ABNORMAL HIGH (ref 4.0–10.5)
nRBC: 0 % (ref 0.0–0.2)

## 2019-03-07 LAB — GLUCOSE, CAPILLARY: Glucose-Capillary: 188 mg/dL — ABNORMAL HIGH (ref 70–99)

## 2019-03-07 LAB — PROTIME-INR
INR: 0.9 (ref 0.8–1.2)
Prothrombin Time: 12.1 seconds (ref 11.4–15.2)

## 2019-03-07 MED ORDER — CEFAZOLIN SODIUM-DEXTROSE 2-4 GM/100ML-% IV SOLN
2.0000 g | INTRAVENOUS | Status: AC
  Administered 2019-03-07: 2 g via INTRAVENOUS

## 2019-03-07 MED ORDER — MIDAZOLAM HCL 2 MG/2ML IJ SOLN
INTRAMUSCULAR | Status: AC
  Filled 2019-03-07: qty 2

## 2019-03-07 MED ORDER — LIDOCAINE-EPINEPHRINE 1 %-1:100000 IJ SOLN
INTRAMUSCULAR | Status: AC
  Filled 2019-03-07: qty 1

## 2019-03-07 MED ORDER — LIDOCAINE-EPINEPHRINE 2 %-1:100000 IJ SOLN
INTRAMUSCULAR | Status: AC | PRN
  Administered 2019-03-07 (×2): 10 mL

## 2019-03-07 MED ORDER — HEPARIN SOD (PORK) LOCK FLUSH 100 UNIT/ML IV SOLN
INTRAVENOUS | Status: AC | PRN
  Administered 2019-03-07: 500 [IU] via INTRAVENOUS

## 2019-03-07 MED ORDER — HEPARIN SOD (PORK) LOCK FLUSH 100 UNIT/ML IV SOLN
INTRAVENOUS | Status: AC
  Filled 2019-03-07: qty 5

## 2019-03-07 MED ORDER — CEFAZOLIN SODIUM-DEXTROSE 2-4 GM/100ML-% IV SOLN
INTRAVENOUS | Status: AC
  Administered 2019-03-07: 2 g via INTRAVENOUS
  Filled 2019-03-07: qty 100

## 2019-03-07 MED ORDER — FENTANYL CITRATE (PF) 100 MCG/2ML IJ SOLN
INTRAMUSCULAR | Status: AC | PRN
  Administered 2019-03-07 (×2): 50 ug via INTRAVENOUS

## 2019-03-07 MED ORDER — FENTANYL CITRATE (PF) 100 MCG/2ML IJ SOLN
INTRAMUSCULAR | Status: AC
  Filled 2019-03-07: qty 2

## 2019-03-07 MED ORDER — MIDAZOLAM HCL 2 MG/2ML IJ SOLN
INTRAMUSCULAR | Status: AC | PRN
  Administered 2019-03-07 (×4): 1 mg via INTRAVENOUS

## 2019-03-07 MED ORDER — SODIUM CHLORIDE 0.9 % IV SOLN
INTRAVENOUS | Status: DC
  Administered 2019-03-07: 12:00:00 via INTRAVENOUS

## 2019-03-07 NOTE — Telephone Encounter (Signed)
Reviewing 4/23 los due to staff message from MM. Added f/u for 5/26 and lab/chemo 6/1. Other appointments remain the same. Confirmed appointments with patient. Patient to get updated schedule at 5/18 visit.

## 2019-03-07 NOTE — Telephone Encounter (Signed)
Per Julien Nordmann okay to proceed today with [facility] cath procedure and elevated WBC. Pt received chemo and steroids yesterday.

## 2019-03-07 NOTE — Procedures (Signed)
Pre Procedure: Poor venous access Post Procedural Dx: Same  Successful placement of right IJ approach port-a-cath with tip at the superior caval atrial junction. The catheter is ready for immediate use.  Estimated Blood Loss: Minimal  Complications: None immediate.  Ronny Bacon, MD Pager #: [phone]

## 2019-03-07 NOTE — Consult Note (Signed)
Chief Complaint: Patient was seen in consultation today for Port-A-Cath placement  Referring Physician(s): Mohamed,Mohamed  Supervising Physician: Sandi Mariscal  Patient Status: [facility] - Out-pt  History of Present Illness: Tonya Shelton is a 63 y.o. female, ex-smoker, with history of multiple medical problems including anemia, chronic kidney disease, diabetes, pretension, hypothyroidism, osteoporosis, sleep apnea, prior a in 2007 now with newly diagnosed squamous cell carcinoma of the left lung. She  has poor venous access and presents today for Port-A-Cath placement for chemotherapy.   Past Medical History:  Diagnosis Date  . Acid reflux   . Anemia    low iron  . Arthritis   . Chronic kidney disease    Stage 3 - Dr. Iona Beard in [place], [place]  . Depression   . Diabetes mellitus without complication ([place])    type 2  . Family history of adverse reaction to anesthesia    mom and sister have n/v  . H/O bladder problems    leaks  . History of hiatal hernia   . Hypertension   . Hypothyroidism   . Neuropathy   . Osteoporosis   . PVC's (premature ventricular contractions)   . Restless legs   . Sleep apnea    uses cpap with oxygen  . Stroke [facility]) 2007   TIA    Past Surgical History:  Procedure Laterality Date  . BUNIONECTOMY    . GALLBLADDER SURGERY    . LUMBAR LAMINECTOMY/DECOMPRESSION MICRODISCECTOMY N/A 10/15/2017   Procedure: LEFT L5-S1 MICRODISCECTOMY;  Surgeon: [name], MD;  Location: [facility];  Service: Orthopedics;  Laterality: N/A;  . NEUROMA SURGERY    . SPINAL FUSION  2003   cervical  . tonsillectomy    . VIDEO BRONCHOSCOPY WITH RADIAL ENDOBRONCHIAL ULTRASOUND N/A 02/09/2019   Procedure: VIDEO BRONCHOSCOPY WITH RADIAL ENDOBRONCHIAL ULTRASOUND;  Surgeon: [name], MD;  Location: [facility] OR;  Service: Thoracic;  Laterality: N/A;    Allergies: Erythromycin and Povidone iodine  Medications: Prior to Admission medications   Medication Sig  Start Date End Date Taking? Authorizing Provider  acetaminophen (TYLENOL) 500 MG tablet Take 1,000-1,500 mg by mouth daily as needed for moderate pain.    Yes [provider]  alendronate (FOSAMAX) 70 MG tablet Take 70 mg by mouth every Sunday.  04/24/15  Yes [provider]  aspirin EC 81 MG tablet Take 81 mg by mouth daily at 12 noon.   Yes [provider]  atorvastatin (LIPITOR) 40 MG tablet Take 40 mg by mouth at bedtime.    Yes [provider]  carvedilol (COREG) 25 MG tablet Take 25 mg by mouth 2 (two) times daily with a meal.   Yes [provider]  cevimeline (EVOXAC) 30 MG capsule Take 30 mg by mouth 2 (two) times daily.  04/24/15  Yes [provider]  DULoxetine (CYMBALTA) 60 MG capsule Take 60 mg by mouth daily.   Yes [provider]  ferrous sulfate 325 (65 FE) MG tablet Take 325 mg by mouth daily.  02/01/17  Yes [provider]  furosemide (LASIX) 20 MG tablet Take 20 mg by mouth daily. 02/01/17  Yes [provider]  gabapentin (NEURONTIN) 100 MG capsule Take 200 mg by mouth at bedtime.    Yes [provider]  glimepiride (AMARYL) 2 MG tablet Take 2 mg by mouth daily with breakfast.  09/29/17  Yes [provider]  levothyroxine (SYNTHROID, LEVOTHROID) 88 MCG tablet Take 88 mcg by mouth daily before breakfast.  Yes [provider]  Magnesium 500 MG TABS Take 500 mg by mouth daily.   Yes [provider]  Misc Natural Products (OSTEO BI-FLEX ADV TRIPLE ST PO) Take 1 tablet by mouth at bedtime.    Yes [provider]  Multiple Vitamins-Minerals (ALIVE WOMENS 50+ PO) Take 1 tablet by mouth daily.   Yes [provider]  Omega-3 Fatty Acids (FISH OIL PO) Take 2,000 mg by mouth 2 (two) times daily.   Yes [provider]  omeprazole (PRILOSEC) 20 MG capsule Take 40 mg by mouth daily.  04/01/15  Yes [provider]  OVER THE COUNTER MEDICATION Place 1-2  tablets under the tongue at bedtime. Restful legs otc   Yes [provider]  oxybutynin (DITROPAN-XL) 10 MG 24 hr tablet Take 10 mg by mouth daily.    Yes [provider]  prochlorperazine (COMPAZINE) 10 MG tablet TAKE 1 TABLET(10 MG) BY MOUTH EVERY 6 HOURS AS NEEDED FOR NAUSEA OR VOMITING 03/06/19  Yes Curt Bears, MD  telmisartan (MICARDIS) 80 MG tablet Take 40 mg by mouth daily at 12 noon.  08/29/15  Yes [provider]  tiZANidine (ZANAFLEX) 4 MG tablet Take 8 mg by mouth at bedtime.  06/02/11  Yes [provider]  TRADJENTA 5 MG TABS tablet Take 5 mg by mouth daily. 02/12/17  Yes [provider]  Vitamin D, Ergocalciferol, (DRISDOL) 50000 units CAPS capsule Take 50,000 Units by mouth every Sunday.  04/29/15  Yes [provider]  nitroGLYCERIN (NITROSTAT) 0.4 MG SL tablet Place 0.4 mg under the tongue every 5 (five) minutes as needed for chest pain.    [provider]  UNABLE TO FIND Med Name: CPAP with 2lpm o2 bled in  [place]    [provider]     Family History  Problem Relation Age of Onset  . Breast cancer Mother   . Endometriosis Mother   . Alcoholism Father   . Lung cancer Neg Hx     Social History   Socioeconomic History  . Marital status: Married    Spouse name: Not on file  . Number of children: Not on file  . Years of education: Not on file  . Highest education level: Not on file  Occupational History  . Not on file  Social Needs  . Financial resource strain: Not on file  . Food insecurity:    Worry: Not on file    Inability: Not on file  . Transportation needs:    Medical: Not on file    Non-medical: Not on file  Tobacco Use  . Smoking status: Former Smoker    Packs/day: 1.50    Years: 40.00    Pack years: 60.00    Types: Cigarettes    Last attempt to quit: 10/26/2012    Years since quitting: 6.3  . Smokeless tobacco: Never Used  Substance and Sexual Activity  . Alcohol use: No  .  Drug use: No  . Sexual activity: Not on file  Lifestyle  . Physical activity:    Days per week: Not on file    Minutes per session: Not on file  . Stress: Not on file  Relationships  . Social connections:    Talks on phone: Not on file    Gets together: Not on file    Attends religious service: Not on file    Active member of club or organization: Not on file    Attends meetings of clubs or organizations:  Not on file    Relationship status: Not on file  Other Topics Concern  . Not on file  Social History Narrative  . Not on file      Review of Systems she denies fever, headache, chest pain, abdominal pain, nausea, vomiting or bleeding.  She does have occasional cough and dyspnea with exertion as well as chronic back pain.  Vital Signs: BP (!) 186/77   Pulse 82   Temp 98 F (36.7 C) (Oral)   Resp 18   SpO2 97%   Physical Exam awake, alert.  Chest with distant breath sounds bilaterally.  Heart with regular rate and rhythm.  Abdomen soft, obese, positive bowel sounds, nontender.  No lower extremity edema.  Imaging: Dg Chest 2 View  Result Date: 02/09/2019 CLINICAL DATA:  Preop EXAM: CHEST - 2 VIEW COMPARISON:  PET-CT, 01/19/2019 FINDINGS: The heart size is within normal limits. Left upper lobe mass and hilar lymphadenopathy. The visualized skeletal structures are unremarkable. IMPRESSION: Redemonstrated left upper lobe mass and left hilar lymphadenopathy without acute abnormality of the lungs. Electronically Signed   By: Eddie Candle M.D.   On: 02/09/2019 08:16   Mr Jeri Cos JG Contrast  Result Date: 03/03/2019 CLINICAL DATA:  Newly diagnosed non-small cell lung cancer. Staging. EXAM: MRI HEAD WITHOUT AND WITH CONTRAST TECHNIQUE: Multiplanar, multiecho pulse sequences of the brain and surrounding structures were obtained without and with intravenous contrast. CONTRAST:  38mL MULTIHANCE GADOBENATE DIMEGLUMINE 529 MG/ML IV SOLN COMPARISON:  None. FINDINGS: Brain: There is no  evidence of acute infarct, intracranial hemorrhage, mass, midline shift, or extra-axial fluid collection. Scattered small foci of T2 hyperintensity in the cerebral white matter nonspecific but compatible with mild chronic small vessel ischemic disease. The ventricles and sulci are within normal limits for age. No abnormal enhancement is identified. Vascular: Major intracranial vascular flow voids are preserved. Skull and upper cervical spine: Unremarkable bone marrow signal. Artifact in the cervical spine related to prior anterior fusion. Sinuses/Orbits: Unremarkable orbits. Clear paranasal sinuses. Trace mastoid fluid bilaterally. Other: None. IMPRESSION: 1. No evidence of intracranial metastases. 2. Mild chronic small vessel ischemic disease. Electronically Signed   By: Logan Bores M.D.   On: 03/03/2019 16:36   Dg C-arm Bronchoscopy  Result Date: 02/09/2019 C-ARM BRONCHOSCOPY: Fluoroscopy was utilized by the requesting physician.  No radiographic interpretation.    Labs:  CBC: Recent Labs    02/16/19 1436 02/28/19 0730 03/06/19 0807 03/07/19 1202  WBC 12.8* 9.1 11.0* 15.4*  HGB 12.9 12.7 12.4 12.2  HCT 39.5 38.4 37.3 36.2  PLT 275 252 247 308    COAGS: Recent Labs    02/03/19 1150 03/07/19 1202  INR 1.0 0.9  APTT 29  --     BMP: Recent Labs    02/03/19 1150 02/16/19 1436 02/28/19 0730 03/06/19 0807  NA 140 143 142 140  K 4.1 3.2* 3.2* 3.4*  CL 103 103 104 102  CO2 22 28 27 26   GLUCOSE 119* 74 159* 191*  BUN 14 10 12 17   CALCIUM 9.1 8.9 8.6* 8.8*  CREATININE 1.08* 1.04* 1.09* 1.21*  GFRNONAA 54* 57* 53* 47*  GFRAA >60 >60 >60 54*    LIVER FUNCTION TESTS: Recent Labs    02/03/19 1150 02/16/19 1436 02/28/19 0730 03/06/19 0807  BILITOT 0.5 0.4 0.4 0.3  AST 22 16 15  14*  ALT 25 22 21 25   ALKPHOS 99 119 103 118  PROT 6.7 7.4 6.7 6.8  ALBUMIN 3.4* 3.4* 3.2* 3.2*  TUMOR MARKERS: No results for input(s): AFPTM, CEA, CA199, CHROMGRNA in the last 8760  hours.  Assessment and Plan: 63 y.o. female, ex-smoker, with history of multiple medical problems including anemia, chronic kidney disease, diabetes, pretension, hypothyroidism, osteoporosis, sleep apnea, prior a in 2007 now with newly diagnosed squamous cell carcinoma of the left lung. She  has poor venous access and presents today for Port-A-Cath placement for chemotherapy.Risks and benefits of image guided port-a-catheter placement was discussed with the patient including, but not limited to bleeding, infection, pneumothorax, or fibrin sheath development and need for additional procedures.  All of the patient's questions were answered, patient is agreeable to proceed. Consent signed and in chart.  WBC today 15.4 ; abs neut 12.2; discussed with Dr. Julien Nordmann- pt received decadron yesterday and ok to proceed with port placement   Thank you for this interesting consult.  I greatly enjoyed meeting Tonya Shelton and look forward to participating in their care.  A copy of this report was sent to the requesting provider on this date.  Electronically Signed: D. Rowe Robert, PA-C 03/07/2019, 12:33 PM   I spent a total of  20 minutes   in face to face in clinical consultation, greater than 50% of which was counseling/coordinating care for port a cath placement

## 2019-03-07 NOTE — Discharge Instructions (Addendum)
Do not use EMLA cream on your port until the cancer center nurses tell you the port has healed. The petroleum in the EMLA cream will dissolve the skin glue (dermabond) and the incision will come apart . You will get an infection of your new port. Use ice in a ziplock bag over your port for about 3-4 minutes before your port is accessed.     Implanted [facility] Guide An implanted port is a device that is placed under the skin. It is usually placed in the chest. The device can be used to give IV medicine, to take blood, or for dialysis. You may have an implanted port if:  You need IV medicine that would be irritating to the small veins in your hands or arms.  You need IV medicines, such as antibiotics, for a long period of time.  You need IV nutrition for a long period of time.  You need dialysis. Having a port means that your health care provider will not need to use the veins in your arms for these procedures. You may have fewer limitations when using a port than you would if you used other types of long-term IVs, and you will likely be able to return to normal activities after your incision heals. An implanted port has two main parts:  Reservoir. The reservoir is the part where a needle is inserted to give medicines or draw blood. The reservoir is round. After it is placed, it appears as a small, raised area under your skin.  Catheter. The catheter is a thin, flexible tube that connects the reservoir to a vein. Medicine that is inserted into the reservoir goes into the catheter and then into the vein. How is my port accessed? To access your port:  A numbing cream may be placed on the skin over the port site.  Your health care provider will put on a mask and sterile gloves.  The skin over your port will be cleaned carefully with a germ-killing soap and allowed to dry.  Your health care provider will gently pinch the port and insert a needle into it.  Your health care provider will  check for a blood return to make sure the port is in the vein and is not clogged.  If your port needs to remain accessed to get medicine continuously (constant infusion), your health care provider will place a clear bandage (dressing) over the needle site. The dressing and needle will need to be changed every week, or as told by your health care provider. What is flushing? Flushing helps keep the port from getting clogged. Follow instructions from your health care provider about how and when to flush the port. Ports are usually flushed with saline solution or a medicine called heparin. The need for flushing will depend on how the port is used:  If the port is only used from time to time to give medicines or draw blood, the port may need to be flushed: ? Before and after medicines have been given. ? Before and after blood has been drawn. ? As part of routine maintenance. Flushing may be recommended every 4-6 weeks.  If a constant infusion is running, the port may not need to be flushed.  Throw away any syringes in a disposal container that is meant for sharp items (sharps container). You can buy a sharps container from a pharmacy, or you can make one by using an empty hard plastic bottle with a cover. How long will my port stay  implanted? The port can stay in for as long as your health care provider thinks it is needed. When it is time for the port to come out, a surgery will be done to remove it. The surgery will be similar to the procedure that was done to put the port in. Follow these instructions at home:   Flush your port as told by your health care provider.  If you need an infusion over several days, follow instructions from your health care provider about how to take care of your port site. Make sure you: ? Wash your hands with soap and water before you change your dressing. If soap and water are not available, use alcohol-based hand sanitizer. ? Change your dressing as told by your  health care provider. ? Place any used dressings or infusion bags into a plastic bag. Throw that bag in the trash. ? Keep the dressing that covers the needle clean and dry. Do not get it wet. ? Do not use scissors or sharp objects near the tube. ? Keep the tube clamped, unless it is being used.  Check your port site every day for signs of infection. Check for: ? Redness, swelling, or pain. ? Fluid or blood. ? Pus or a bad smell.  Protect the skin around the port site. ? Avoid wearing bra straps that rub or irritate the site. ? Protect the skin around your port from seat belts. Place a soft pad over your chest if needed.  Bathe or shower as told by your health care provider. The site may get wet as long as you are not actively receiving an infusion.  Return to your normal activities as told by your health care provider. Ask your health care provider what activities are safe for you.  Carry a medical alert card or wear a medical alert bracelet at all times. This will let health care providers know that you have an implanted port in case of an emergency. Get help right away if:  You have redness, swelling, or pain at the port site.  You have fluid or blood coming from your port site.  You have pus or a bad smell coming from the port site.  You have a fever. Summary  Implanted ports are usually placed in the chest for long-term IV access.  Follow instructions from your health care provider about flushing the port and changing bandages (dressings).  Take care of the area around your port by avoiding clothing that puts pressure on the area, and by watching for signs of infection.  Protect the skin around your port from seat belts. Place a soft pad over your chest if needed.  Get help right away if you have a fever or you have redness, swelling, pain, drainage, or a bad smell at the port site. This information is not intended to replace advice given to you by your health care  provider. Make sure you discuss any questions you have with your health care provider. Document Released: 10/12/2005 Document Revised: 11/14/2016 Document Reviewed: 11/14/2016 Elsevier Interactive Patient Education  2019 [place].     Moderate Conscious Sedation, Adult, Care After These instructions provide you with information about caring for yourself after your procedure. Your health care provider may also give you more specific instructions. Your treatment has been planned according to current medical practices, but problems sometimes occur. Call your health care provider if you have any problems or questions after your procedure. What can I expect after the procedure? After your  procedure, it is common:  To feel sleepy for several hours.  To feel clumsy and have poor balance for several hours.  To have poor judgment for several hours.  To vomit if you eat too soon. Follow these instructions at home: For at least 24 hours after the procedure:   Do not: ? Participate in activities where you could fall or become injured. ? Drive. ? Use heavy machinery. ? Drink alcohol. ? Take sleeping pills or medicines that cause drowsiness. ? Make important decisions or sign legal documents. ? Take care of children on your own.  Rest. Eating and drinking  Follow the diet recommended by your health care provider.  If you vomit: ? Drink water, juice, or soup when you can drink without vomiting. ? Make sure you have little or no nausea before eating solid foods. General instructions  Have a responsible adult stay with you until you are awake and alert.  Take over-the-counter and prescription medicines only as told by your health care provider.  If you smoke, do not smoke without supervision.  Keep all follow-up visits as told by your health care provider. This is important. Contact a health care provider if:  You keep feeling nauseous or you keep vomiting.  You feel  light-headed.  You develop a rash.  You have a fever. Get help right away if:  You have trouble breathing. This information is not intended to replace advice given to you by your health care provider. Make sure you discuss any questions you have with your health care provider. Document Released: 08/02/2013 Document Revised: 03/16/2016 Document Reviewed: 02/01/2016 Elsevier Interactive Patient Education  2019 [employer].

## 2019-03-10 ENCOUNTER — Other Ambulatory Visit: Payer: Self-pay | Admitting: Internal Medicine

## 2019-03-13 ENCOUNTER — Inpatient Hospital Stay: Payer: Medicare Other

## 2019-03-13 ENCOUNTER — Other Ambulatory Visit: Payer: Self-pay

## 2019-03-13 VITALS — BP 114/46 | HR 63 | Temp 98.1°F | Resp 18

## 2019-03-13 DIAGNOSIS — C3492 Malignant neoplasm of unspecified part of left bronchus or lung: Secondary | ICD-10-CM

## 2019-03-13 DIAGNOSIS — Z5111 Encounter for antineoplastic chemotherapy: Secondary | ICD-10-CM | POA: Diagnosis not present

## 2019-03-13 LAB — CBC WITH DIFFERENTIAL (CANCER CENTER ONLY)
Abs Immature Granulocytes: 0.07 10*3/uL (ref 0.00–0.07)
Basophils Absolute: 0 10*3/uL (ref 0.0–0.1)
Basophils Relative: 0 %
Eosinophils Absolute: 0.1 10*3/uL (ref 0.0–0.5)
Eosinophils Relative: 1 %
HCT: 37.6 % (ref 36.0–46.0)
Hemoglobin: 12.1 g/dL (ref 12.0–15.0)
Immature Granulocytes: 1 %
Lymphocytes Relative: 29 %
Lymphs Abs: 3.6 10*3/uL (ref 0.7–4.0)
MCH: 31.9 pg (ref 26.0–34.0)
MCHC: 32.2 g/dL (ref 30.0–36.0)
MCV: 99.2 fL (ref 80.0–100.0)
Monocytes Absolute: 0.5 10*3/uL (ref 0.1–1.0)
Monocytes Relative: 5 %
Neutro Abs: 7.8 10*3/uL — ABNORMAL HIGH (ref 1.7–7.7)
Neutrophils Relative %: 64 %
Platelet Count: 277 10*3/uL (ref 150–400)
RBC: 3.79 MIL/uL — ABNORMAL LOW (ref 3.87–5.11)
RDW: 14.3 % (ref 11.5–15.5)
WBC Count: 12.1 10*3/uL — ABNORMAL HIGH (ref 4.0–10.5)
nRBC: 0 % (ref 0.0–0.2)

## 2019-03-13 LAB — CMP (CANCER CENTER ONLY)
ALT: 27 U/L (ref 0–44)
AST: 17 U/L (ref 15–41)
Albumin: 3.1 g/dL — ABNORMAL LOW (ref 3.5–5.0)
Alkaline Phosphatase: 116 U/L (ref 38–126)
Anion gap: 8 (ref 5–15)
BUN: 16 mg/dL (ref 8–23)
CO2: 28 mmol/L (ref 22–32)
Calcium: 8.7 mg/dL — ABNORMAL LOW (ref 8.9–10.3)
Chloride: 106 mmol/L (ref 98–111)
Creatinine: 1.09 mg/dL — ABNORMAL HIGH (ref 0.44–1.00)
GFR, Est AFR Am: >60 mL/min (ref >60)
GFR, Estimated: 53 mL/min — ABNORMAL LOW (ref >60)
Glucose, Bld: 98 mg/dL (ref 70–99)
Potassium: 4.3 mmol/L (ref 3.5–5.1)
Sodium: 142 mmol/L (ref 135–145)
Total Bilirubin: 0.2 mg/dL — ABNORMAL LOW (ref 0.3–1.2)
Total Protein: 6.5 g/dL (ref 6.5–8.1)

## 2019-03-13 MED ORDER — DIPHENHYDRAMINE HCL 50 MG/ML IJ SOLN
INTRAMUSCULAR | Status: AC
  Filled 2019-03-13: qty 1

## 2019-03-13 MED ORDER — PALONOSETRON HCL INJECTION 0.25 MG/5ML
INTRAVENOUS | Status: AC
  Filled 2019-03-13: qty 5

## 2019-03-13 MED ORDER — PALONOSETRON HCL INJECTION 0.25 MG/5ML
0.2500 mg | Freq: Once | INTRAVENOUS | Status: AC
  Administered 2019-03-13: 0.25 mg via INTRAVENOUS

## 2019-03-13 MED ORDER — DIPHENHYDRAMINE HCL 50 MG/ML IJ SOLN
50.0000 mg | Freq: Once | INTRAMUSCULAR | Status: AC
  Administered 2019-03-13: 14:00:00 50 mg via INTRAVENOUS

## 2019-03-13 MED ORDER — SODIUM CHLORIDE 0.9 % IV SOLN
212.4000 mg | Freq: Once | INTRAVENOUS | Status: AC
  Administered 2019-03-13: 210 mg via INTRAVENOUS
  Filled 2019-03-13: qty 21

## 2019-03-13 MED ORDER — HEPARIN SOD (PORK) LOCK FLUSH 100 UNIT/ML IV SOLN
500.0000 [IU] | Freq: Once | INTRAVENOUS | Status: AC | PRN
  Administered 2019-03-13: 500 [IU]
  Filled 2019-03-13: qty 5

## 2019-03-13 MED ORDER — FAMOTIDINE IN NACL 20-0.9 MG/50ML-% IV SOLN
20.0000 mg | Freq: Once | INTRAVENOUS | Status: AC
  Administered 2019-03-13: 20 mg via INTRAVENOUS

## 2019-03-13 MED ORDER — FAMOTIDINE IN NACL 20-0.9 MG/50ML-% IV SOLN
INTRAVENOUS | Status: AC
  Filled 2019-03-13: qty 50

## 2019-03-13 MED ORDER — SODIUM CHLORIDE 0.9 % IV SOLN
45.0000 mg/m2 | Freq: Once | INTRAVENOUS | Status: AC
  Administered 2019-03-13: 90 mg via INTRAVENOUS
  Filled 2019-03-13: qty 15

## 2019-03-13 MED ORDER — SODIUM CHLORIDE 0.9 % IV SOLN
Freq: Once | INTRAVENOUS | Status: AC
  Administered 2019-03-13: 14:00:00 via INTRAVENOUS
  Filled 2019-03-13: qty 250

## 2019-03-13 MED ORDER — SODIUM CHLORIDE 0.9% FLUSH
10.0000 mL | INTRAVENOUS | Status: DC | PRN
  Administered 2019-03-13: 10 mL
  Filled 2019-03-13: qty 10

## 2019-03-13 MED ORDER — SODIUM CHLORIDE 0.9 % IV SOLN
20.0000 mg | Freq: Once | INTRAVENOUS | Status: AC
  Administered 2019-03-13: 20 mg via INTRAVENOUS
  Filled 2019-03-13: qty 20

## 2019-03-13 NOTE — Patient Instructions (Signed)
[facility] Discharge Instructions for Patients Receiving Chemotherapy  Today you received the following chemotherapy agents:  Taxol, Carboplatin  To help prevent nausea and vomiting after your treatment, we encourage you to take your nausea medication as prescribed.   If you develop nausea and vomiting that is not controlled by your nausea medication, call the clinic.   BELOW ARE SYMPTOMS THAT SHOULD BE REPORTED IMMEDIATELY:  *FEVER GREATER THAN 100.5 F  *CHILLS WITH OR WITHOUT FEVER  NAUSEA AND VOMITING THAT IS NOT CONTROLLED WITH YOUR NAUSEA MEDICATION  *UNUSUAL SHORTNESS OF BREATH  *UNUSUAL BRUISING OR BLEEDING  TENDERNESS IN MOUTH AND THROAT WITH OR WITHOUT PRESENCE OF ULCERS  *URINARY PROBLEMS  *BOWEL PROBLEMS  UNUSUAL RASH Items with * indicate a potential emergency and should be followed up as soon as possible.  Feel free to call the clinic should you have any questions or concerns. The clinic phone number is [phone].  Please show the CHEMO ALERT CARD at check-in to the Emergency Department and triage nurse.   

## 2019-03-14 ENCOUNTER — Ambulatory Visit (INDEPENDENT_AMBULATORY_CARE_PROVIDER_SITE_OTHER): Payer: Medicare Other | Admitting: Internal Medicine

## 2019-03-14 ENCOUNTER — Telehealth: Payer: Self-pay | Admitting: *Deleted

## 2019-03-14 ENCOUNTER — Encounter: Payer: Self-pay | Admitting: Internal Medicine

## 2019-03-14 VITALS — BP 146/70 | HR 86 | Temp 98.4°F | Ht 62.0 in | Wt 212.6 lb

## 2019-03-14 DIAGNOSIS — R918 Other nonspecific abnormal finding of lung field: Secondary | ICD-10-CM | POA: Diagnosis not present

## 2019-03-14 DIAGNOSIS — R0609 Other forms of dyspnea: Secondary | ICD-10-CM | POA: Diagnosis not present

## 2019-03-14 NOTE — Patient Instructions (Signed)
Pulmonary follow up is as needed  >  you do not have any significant COPD at this point

## 2019-03-14 NOTE — Telephone Encounter (Signed)
Received call from [place] @ [facility] in [place], [place].  This patient will be receiving concurrent radiatio and chemo. Her radiation treatments will be done in [place], [place].  She is having simulation done tomorrow and will start radiation in approx 7-10 days. [facility] in requesting her weekly labs be fax'd to them @ [phone]  If any questions we can call [facility] @ [phone]

## 2019-03-14 NOTE — Progress Notes (Signed)
Tonya Shelton, female    DOB: July 13, 1956, 63 y.o.   MRN: [id number]   Brief patient profile:  80 yowf quit smoking 2014 but still vaping  with OSA/Cpap 02 2lpm hs underwent LDSCT 3/2/20200 with finding of LUL mass 2.9 x 5 sm extending to L hilum so referred to pulmonary clinic 01/17/2019 by Rueben Bash  Has h/o progressive wt gain since stopped smoking complicated by  hbp/ dm/ fibromyalgia/ Osteoporosis, RA, DJD, hypothryoid, GERD, OSA and CRI/neuropathy       History of Present Illness  01/17/2019  Pulmonary/ 1st office eval/Augie Vane  Chief Complaint  Patient presents with  . Pulmonary Consult    Referred by Tiffany Plunk for eval of abnormal ct chest. The pt c/o SOB over the past 2 years.   Dyspnea:  Indolent onset / progressive sev years assoc with wt gain since stopped smoking  / MMRC3 = can't walk 100 yards even at a slow pace at a flat grade s stopping due to sob   Cough: none  Sleep: fine cpap / flat  SABA use: none  rec Stop vaping   - PET 01/19/2019 1. Hypermetabolic (max SUV 80.9) solid 3.8 cm posterior left upper lobe lung mass, compatible with primary bronchogenic carcinoma. 2. Hypermetabolic left suprahilar adenopathy. 3. No hypermetabolic distant metastatic disease. 4. PET-CT stage IIB (T2a N1 M0) > discussed with pt 01/20/2019 and referred to T Surgery >  02/09/2019 fob/ebus :  Brushings pos NSC  rec chemo/rt then consider RULobectomy    03/14/2019  f/u ov/Heliodoro Domagalski re: doe related to obesity > copd/ lung mass  Chief Complaint  Patient presents with  . Follow-up    Breathing is doing well and no new co's.   Dyspnea:  MMRC3 = can't walk 100 yards even at a slow pace at a flat grade s stopping due to sob   Cough: none Sleeping: on cpap and 2lpm / flat SABA use: none  02: just at hs    No obvious day to day or daytime variability or assoc excess/ purulent sputum or mucus plugs or hemoptysis or cp or chest tightness, subjective wheeze or overt sinus or hb symptoms.    Sleeping as above without nocturnal  or early am exacerbation  of respiratory  c/o's or need for noct saba. Also denies any obvious fluctuation of symptoms with weather or environmental changes or other aggravating or alleviating factors except as outlined above   No unusual exposure hx or h/o childhood pna/ asthma or knowledge of premature birth.  Current Allergies, Complete Past Medical History, Past Surgical History, Family History, and Social History were reviewed in [employer] record.  ROS  The following are not active complaints unless bolded Hoarseness, sore throat, dysphagia, dental problems, itching, sneezing,  nasal congestion or discharge of excess mucus or purulent secretions, ear ache,   fever, chills, sweats, unintended wt loss or wt gain, classically pleuritic or exertional cp,  orthopnea pnd or arm/hand swelling  or leg swelling, presyncope, palpitations, abdominal pain, anorexia, nausea, vomiting, diarrhea  or change in bowel habits or change in bladder habits, change in stools or change in urine, dysuria, hematuria,  rash, arthralgias, visual complaints, headache, numbness, weakness or ataxia or problems with walking or coordination,  change in mood or  memory.        Current Meds  Medication Sig  . acetaminophen (TYLENOL) 500 MG tablet Take 1,000-1,500 mg by mouth daily as needed for moderate pain.   Marland Kitchen alendronate (FOSAMAX) 70  MG tablet Take 70 mg by mouth every Sunday.   Marland Kitchen aspirin EC 81 MG tablet Take 81 mg by mouth daily at 12 noon.  Marland Kitchen atorvastatin (LIPITOR) 40 MG tablet Take 40 mg by mouth at bedtime.   . carvedilol (COREG) 25 MG tablet Take 25 mg by mouth 2 (two) times daily with a meal.  . cevimeline (EVOXAC) 30 MG capsule Take 30 mg by mouth 2 (two) times daily.   . DULoxetine (CYMBALTA) 60 MG capsule Take 60 mg by mouth daily.  . ferrous sulfate 325 (65 FE) MG tablet Take 325 mg by mouth daily.   . furosemide (LASIX) 20 MG tablet Take 20 mg by  mouth daily.  Marland Kitchen gabapentin (NEURONTIN) 100 MG capsule Take 200 mg by mouth at bedtime.   Marland Kitchen glimepiride (AMARYL) 2 MG tablet Take 2 mg by mouth daily with breakfast.   . levothyroxine (SYNTHROID, LEVOTHROID) 88 MCG tablet Take 88 mcg by mouth daily before breakfast.  . Magnesium 500 MG TABS Take 500 mg by mouth daily.  . Misc Natural Products (OSTEO BI-FLEX ADV TRIPLE ST PO) Take 1 tablet by mouth at bedtime.   . Multiple Vitamins-Minerals (ALIVE WOMENS 50+ PO) Take 1 tablet by mouth daily.  . nitroGLYCERIN (NITROSTAT) 0.4 MG SL tablet Place 0.4 mg under the tongue every 5 (five) minutes as needed for chest pain.  . Omega-3 Fatty Acids (FISH OIL PO) Take 2,000 mg by mouth 2 (two) times daily.  Marland Kitchen omeprazole (PRILOSEC) 20 MG capsule Take 40 mg by mouth daily.   Marland Kitchen OVER THE COUNTER MEDICATION Place 1-2 tablets under the tongue at bedtime. Restful legs otc  . oxybutynin (DITROPAN-XL) 10 MG 24 hr tablet Take 10 mg by mouth daily.   . prochlorperazine (COMPAZINE) 10 MG tablet TAKE 1 TABLET(10 MG) BY MOUTH EVERY 6 HOURS AS NEEDED FOR NAUSEA OR VOMITING  . telmisartan (MICARDIS) 80 MG tablet Take 40 mg by mouth daily at 12 noon.   Marland Kitchen tiZANidine (ZANAFLEX) 4 MG tablet Take 8 mg by mouth at bedtime.   . TRADJENTA 5 MG TABS tablet Take 5 mg by mouth daily.  Marland Kitchen UNABLE TO FIND Med Name: CPAP with 2lpm o2 bled in  [place]  . Vitamin D, Ergocalciferol, (DRISDOL) 50000 units CAPS capsule Take 50,000 Units by mouth every Sunday.               Objective:     Wt Readings from Last 3 Encounters:  03/14/19 212 lb 9.6 oz (96.4 kg)  03/06/19 204 lb 12 oz (92.9 kg)  02/16/19 207 lb 6.4 oz (94.1 kg)     Vital signs reviewed - Note on arrival 02 sats  97% on RA      Obese amb wf nad    HEENT: nl dentition, turbinates bilaterally, and oropharynx. Nl external ear canals without cough reflex   NECK :  without JVD/Nodes/TM/ nl carotid upstrokes bilaterally   LUNGS: no acc muscle use,  Nl contour chest  which is clear to A and P bilaterally without cough on insp or exp maneuvers   CV:  RRR  no s3 or murmur or increase in P2, and no edema   ABD:  Obese soft and nontender with nl inspiratory excursion in the supine position. No bruits or organomegaly appreciated, bowel sounds nl  MS:  Nl gait/ ext warm without deformities, calf tenderness, cyanosis or clubbing No obvious joint restrictions   SKIN: warm and dry without lesions    NEURO:  alert, approp, nl sensorium with  no motor or cerebellar deficits apparent.                Assessment

## 2019-03-17 ENCOUNTER — Encounter: Payer: Self-pay | Admitting: Internal Medicine

## 2019-03-17 NOTE — Assessment & Plan Note (Addendum)
Quit smoking 2014 but still vaping - 01/17/2019   Walked RA  2 laps @  approx 223ft each @ moderately slow pace with 4 pronged walker stopped due to end of study, sats 92% at end   - unable to do spirometry 01/17/2019 due to corona virus restrictions - PFT's  02/06/2019  FEV1 1.69 (74 % ) ratio 0.73  p 9 % improvement from saba p nothing prior to study with DLCO  86 % corrects to 101 % for alv volume with min convexity to f/v curve and erv 35%   - 03/14/2019   Walked RA  2 laps @  approx 274ft each @ nl pace  stopped due to  End of study no sob or desats    I had an extended final summary discussion with the patient reviewing all relevant studies completed to date and  lasting 15 to 20 minutes of a 25 minute visit on the following issues:   I directly observed portions of ambulatory 02 saturation study which extended face to face time for this visit   >>>  She is gold 0 copd and not smoking with adequate activity tol and no desats or tendency to aecopd so no pulmonary meds needs and pulmonary f/u can be prn  >> only significant finding is low ERV c/w body habitus (centripetal obesity) >  Advised    Each maintenance medication was reviewed in detail including most importantly the difference between maintenance and as needed and under what circumstances the prns are to be used.  Please see AVS for specific  Instructions which are unique to this visit and I personally typed out  which were reviewed in detail in writing with the patient and a copy provided.

## 2019-03-17 NOTE — Assessment & Plan Note (Signed)
CT chest = LDSCT 01/04/19 - 2.9 x 5 cm LUL mass abutting the major fissur and ext to L hilum with LH adenopathy  - PET 01/19/2019 1. Hypermetabolic (max SUV 69.2) solid 3.8 cm posterior left upper lobe lung mass, compatible with primary bronchogenic carcinoma. 2. Hypermetabolic left suprahilar adenopathy. 3. No hypermetabolic distant metastatic disease. 4. PET-CT stage IIB (T2a N1 M0) > discussed with pt 01/20/2019 and referred to T Surgery >  02/09/2019 fob/ebus :  Brushings pos NSC > rec rt/chemo then ?LULobectomy    F/u onc/RT/ T surg planned

## 2019-03-21 ENCOUNTER — Inpatient Hospital Stay: Payer: Medicare Other

## 2019-03-21 ENCOUNTER — Other Ambulatory Visit: Payer: Self-pay

## 2019-03-21 ENCOUNTER — Telehealth: Payer: Self-pay | Admitting: Medical Oncology

## 2019-03-21 ENCOUNTER — Inpatient Hospital Stay (HOSPITAL_BASED_OUTPATIENT_CLINIC_OR_DEPARTMENT_OTHER): Payer: Medicare Other | Admitting: Internal Medicine

## 2019-03-21 ENCOUNTER — Encounter: Payer: Self-pay | Admitting: Internal Medicine

## 2019-03-21 VITALS — BP 115/52 | HR 73 | Temp 98.7°F | Resp 18 | Wt 209.0 lb

## 2019-03-21 DIAGNOSIS — Z5111 Encounter for antineoplastic chemotherapy: Secondary | ICD-10-CM

## 2019-03-21 DIAGNOSIS — C3412 Malignant neoplasm of upper lobe, left bronchus or lung: Secondary | ICD-10-CM | POA: Diagnosis not present

## 2019-03-21 DIAGNOSIS — C3492 Malignant neoplasm of unspecified part of left bronchus or lung: Secondary | ICD-10-CM

## 2019-03-21 DIAGNOSIS — Z95828 Presence of other vascular implants and grafts: Secondary | ICD-10-CM

## 2019-03-21 LAB — CBC WITH DIFFERENTIAL (CANCER CENTER ONLY)
Abs Immature Granulocytes: 0.07 10*3/uL (ref 0.00–0.07)
Basophils Absolute: 0 10*3/uL (ref 0.0–0.1)
Basophils Relative: 0 %
Eosinophils Absolute: 0 10*3/uL (ref 0.0–0.5)
Eosinophils Relative: 0 %
HCT: 38.3 % (ref 36.0–46.0)
Hemoglobin: 12.6 g/dL (ref 12.0–15.0)
Immature Granulocytes: 1 %
Lymphocytes Relative: 34 %
Lymphs Abs: 3.5 10*3/uL (ref 0.7–4.0)
MCH: 32.1 pg (ref 26.0–34.0)
MCHC: 32.9 g/dL (ref 30.0–36.0)
MCV: 97.7 fL (ref 80.0–100.0)
Monocytes Absolute: 0.7 10*3/uL (ref 0.1–1.0)
Monocytes Relative: 7 %
Neutro Abs: 6.1 10*3/uL (ref 1.7–7.7)
Neutrophils Relative %: 58 %
Platelet Count: 248 10*3/uL (ref 150–400)
RBC: 3.92 MIL/uL (ref 3.87–5.11)
RDW: 14.5 % (ref 11.5–15.5)
WBC Count: 10.4 10*3/uL (ref 4.0–10.5)
nRBC: 0 % (ref 0.0–0.2)

## 2019-03-21 LAB — CMP (CANCER CENTER ONLY)
ALT: 31 U/L (ref 0–44)
AST: 21 U/L (ref 15–41)
Albumin: 3.4 g/dL — ABNORMAL LOW (ref 3.5–5.0)
Alkaline Phosphatase: 98 U/L (ref 38–126)
Anion gap: 11 (ref 5–15)
BUN: 15 mg/dL (ref 8–23)
CO2: 27 mmol/L (ref 22–32)
Calcium: 9 mg/dL (ref 8.9–10.3)
Chloride: 103 mmol/L (ref 98–111)
Creatinine: 1.25 mg/dL — ABNORMAL HIGH (ref 0.44–1.00)
GFR, Est AFR Am: 52 mL/min — ABNORMAL LOW (ref >60)
GFR, Estimated: 45 mL/min — ABNORMAL LOW (ref >60)
Glucose, Bld: 103 mg/dL — ABNORMAL HIGH (ref 70–99)
Potassium: 4.2 mmol/L (ref 3.5–5.1)
Sodium: 141 mmol/L (ref 135–145)
Total Bilirubin: 0.3 mg/dL (ref 0.3–1.2)
Total Protein: 7 g/dL (ref 6.5–8.1)

## 2019-03-21 MED ORDER — DIPHENHYDRAMINE HCL 50 MG/ML IJ SOLN
50.0000 mg | Freq: Once | INTRAMUSCULAR | Status: AC
  Administered 2019-03-21: 15:00:00 50 mg via INTRAVENOUS

## 2019-03-21 MED ORDER — SODIUM CHLORIDE 0.9 % IV SOLN
180.0000 mg | Freq: Once | INTRAVENOUS | Status: AC
  Administered 2019-03-21: 17:00:00 180 mg via INTRAVENOUS
  Filled 2019-03-21: qty 18

## 2019-03-21 MED ORDER — FAMOTIDINE IN NACL 20-0.9 MG/50ML-% IV SOLN
INTRAVENOUS | Status: AC
  Filled 2019-03-21: qty 50

## 2019-03-21 MED ORDER — SODIUM CHLORIDE 0.9 % IV SOLN
45.0000 mg/m2 | Freq: Once | INTRAVENOUS | Status: AC
  Administered 2019-03-21: 16:00:00 90 mg via INTRAVENOUS
  Filled 2019-03-21: qty 15

## 2019-03-21 MED ORDER — FAMOTIDINE IN NACL 20-0.9 MG/50ML-% IV SOLN
20.0000 mg | Freq: Once | INTRAVENOUS | Status: AC
  Administered 2019-03-21: 15:00:00 20 mg via INTRAVENOUS

## 2019-03-21 MED ORDER — HEPARIN SOD (PORK) LOCK FLUSH 100 UNIT/ML IV SOLN
500.0000 [IU] | Freq: Once | INTRAVENOUS | Status: AC | PRN
  Administered 2019-03-21: 18:00:00 500 [IU]
  Filled 2019-03-21: qty 5

## 2019-03-21 MED ORDER — DIPHENHYDRAMINE HCL 50 MG/ML IJ SOLN
INTRAMUSCULAR | Status: AC
  Filled 2019-03-21: qty 1

## 2019-03-21 MED ORDER — SODIUM CHLORIDE 0.9% FLUSH
10.0000 mL | INTRAVENOUS | Status: DC | PRN
  Administered 2019-03-21: 13:00:00 10 mL via INTRAVENOUS
  Filled 2019-03-21: qty 10

## 2019-03-21 MED ORDER — PALONOSETRON HCL INJECTION 0.25 MG/5ML
0.2500 mg | Freq: Once | INTRAVENOUS | Status: AC
  Administered 2019-03-21: 15:00:00 0.25 mg via INTRAVENOUS

## 2019-03-21 MED ORDER — SODIUM CHLORIDE 0.9 % IV SOLN
Freq: Once | INTRAVENOUS | Status: AC
  Administered 2019-03-21: 15:00:00 via INTRAVENOUS
  Filled 2019-03-21: qty 250

## 2019-03-21 MED ORDER — PALONOSETRON HCL INJECTION 0.25 MG/5ML
INTRAVENOUS | Status: AC
  Filled 2019-03-21: qty 5

## 2019-03-21 MED ORDER — SODIUM CHLORIDE 0.9% FLUSH
10.0000 mL | INTRAVENOUS | Status: DC | PRN
  Administered 2019-03-21: 18:00:00 10 mL
  Filled 2019-03-21: qty 10

## 2019-03-21 MED ORDER — SODIUM CHLORIDE 0.9 % IV SOLN
20.0000 mg | Freq: Once | INTRAVENOUS | Status: AC
  Administered 2019-03-21: 15:00:00 20 mg via INTRAVENOUS
  Filled 2019-03-21: qty 20

## 2019-03-21 NOTE — Telephone Encounter (Signed)
Spoke to [employer] and the xrt plan is in process at MD level.  Patsy will call back with an expected date to start.

## 2019-03-21 NOTE — Telephone Encounter (Signed)
XRT  should be ready to start xrt on Monday 6/1.

## 2019-03-21 NOTE — Progress Notes (Signed)
Decrease carboplatin dose to 180mg  today to reflect SCr increase and keep dose basis at AUC = 2.   Demetrius Charity, PharmD, [facility] Phone: [phone] 03/21/2019

## 2019-03-21 NOTE — Progress Notes (Signed)
[facility] [phone]   [phone]  OFFICE PROGRESS NOTE  [name], FNP [place] [address]  DIAGNOSIS: stage IIb (T2b, N1, M0) non-small cell lung cancer, squamous cell carcinoma presented with right upper lobe lung mass in addition to left hilar adenopathy.  There was also a suspicious groundglass opacity bilaterally.  PRIOR THERAPY: None.  CURRENT THERAPY: Concurrent chemoradiation with weekly carboplatin for AUC of 4 and paclitaxel 45 mg/M2.  Status post 3 cycles.  She received her radiation in [place] under the care of Dr. Danny Lawless.   INTERVAL HISTORY: Tonya Shelton 63 y.o. female returns to the clinic today for follow-up visit.  The patient is feeling fine today with no concerning complaints.  She denied having any current chest pain, shortness of breath except with exertion with no cough or hemoptysis.  She denied having any fever or chills.  She has no nausea, vomiting, diarrhea or constipation.  She continues to tolerate her weekly chemotherapy fairly well.  The patient just mentioned that she has consolidation therapy.  She met Dr.Goodchild few weeks ago and she had the simulation and marking for the radiation done she has not received the prescription yet.  The patient is here today for evaluation before starting cycle #4 of her chemotherapy.  MEDICAL HISTORY: Past Medical History:  Diagnosis Date   Acid reflux    Anemia    low iron   Arthritis    Chronic kidney disease    Stage 3 - Dr. Iona Beard in [place], [place]   Depression    Diabetes mellitus without complication [facility])    type 2   Family history of adverse reaction to anesthesia    mom and sister have n/v   H/O bladder problems    leaks   History of hiatal hernia    Hypertension    Hypothyroidism    Neuropathy    Osteoporosis    PVC's (premature ventricular contractions)    Restless legs    Sleep apnea      uses cpap with oxygen   Stroke ([place]) 2007   TIA    ALLERGIES:  is allergic to erythromycin and povidone iodine.  MEDICATIONS:  Current Outpatient Medications  Medication Sig Dispense Refill   acetaminophen (TYLENOL) 500 MG tablet Take 1,000-1,500 mg by mouth daily as needed for moderate pain.      alendronate (FOSAMAX) 70 MG tablet Take 70 mg by mouth every Sunday.      aspirin EC 81 MG tablet Take 81 mg by mouth daily at 12 noon.     atorvastatin (LIPITOR) 40 MG tablet Take 40 mg by mouth at bedtime.      carvedilol (COREG) 25 MG tablet Take 25 mg by mouth 2 (two) times daily with a meal.     cevimeline (EVOXAC) 30 MG capsule Take 30 mg by mouth 2 (two) times daily.      DULoxetine (CYMBALTA) 60 MG capsule Take 60 mg by mouth daily.     ferrous sulfate 325 (65 FE) MG tablet Take 325 mg by mouth daily.   0   furosemide (LASIX) 20 MG tablet Take 20 mg by mouth daily.  0   gabapentin (NEURONTIN) 100 MG capsule Take 200 mg by mouth at bedtime.      glimepiride (AMARYL) 2 MG tablet Take 2 mg by mouth daily with breakfast.   0   levothyroxine (SYNTHROID, LEVOTHROID) 88 MCG tablet Take 88 mcg  by mouth daily before breakfast.     Magnesium 500 MG TABS Take 500 mg by mouth daily.     Misc Natural Products (OSTEO BI-FLEX ADV TRIPLE ST PO) Take 1 tablet by mouth at bedtime.      Multiple Vitamins-Minerals (ALIVE WOMENS 50+ PO) Take 1 tablet by mouth daily.     nitroGLYCERIN (NITROSTAT) 0.4 MG SL tablet Place 0.4 mg under the tongue every 5 (five) minutes as needed for chest pain.     Omega-3 Fatty Acids (FISH OIL PO) Take 2,000 mg by mouth 2 (two) times daily.     omeprazole (PRILOSEC) 20 MG capsule Take 40 mg by mouth daily.      OVER THE COUNTER MEDICATION Place 1-2 tablets under the tongue at bedtime. Restful legs otc     oxybutynin (DITROPAN-XL) 10 MG 24 hr tablet Take 10 mg by mouth daily.      prochlorperazine (COMPAZINE) 10 MG tablet TAKE 1 TABLET(10 MG) BY MOUTH  EVERY 6 HOURS AS NEEDED FOR NAUSEA OR VOMITING 30 tablet 0   telmisartan (MICARDIS) 80 MG tablet Take 40 mg by mouth daily at 12 noon.      tiZANidine (ZANAFLEX) 4 MG tablet Take 8 mg by mouth at bedtime.      TRADJENTA 5 MG TABS tablet Take 5 mg by mouth daily.  0   UNABLE TO FIND Med Name: CPAP with 2lpm o2 bled in  [place]     Vitamin D, Ergocalciferol, (DRISDOL) 50000 units CAPS capsule Take 50,000 Units by mouth every Sunday.      No current facility-administered medications for this visit.    Facility-Administered Medications Ordered in Other Visits  Medication Dose Route Frequency Provider Last Rate Last Dose   sodium chloride flush (NS) 0.9 % injection 10 mL  10 mL Intravenous PRN Curt Bears, MD   10 mL at 03/21/19 1328    SURGICAL HISTORY:  Past Surgical History:  Procedure Laterality Date   BUNIONECTOMY     GALLBLADDER SURGERY     IR IMAGING GUIDED PORT INSERTION  03/07/2019   LUMBAR LAMINECTOMY/DECOMPRESSION MICRODISCECTOMY N/A 10/15/2017   Procedure: LEFT L5-S1 MICRODISCECTOMY;  Surgeon: [name], MD;  Location: [facility];  Service: Orthopedics;  Laterality: N/A;   NEUROMA SURGERY     SPINAL FUSION  2003   cervical   tonsillectomy     VIDEO BRONCHOSCOPY WITH RADIAL ENDOBRONCHIAL ULTRASOUND N/A 02/09/2019   Procedure: VIDEO BRONCHOSCOPY WITH RADIAL ENDOBRONCHIAL ULTRASOUND;  Surgeon: [name], MD;  Location: [facility];  Service: Thoracic;  Laterality: N/A;    REVIEW OF SYSTEMS:  A comprehensive review of systems was negative except for: Respiratory: positive for dyspnea on exertion   PHYSICAL EXAMINATION: General appearance: alert, cooperative and no distress Head: Normocephalic, without obvious abnormality, atraumatic Neck: no adenopathy, no JVD, supple, symmetrical, trachea midline and thyroid not enlarged, symmetric, no tenderness/mass/nodules Lymph nodes: Cervical, supraclavicular, and axillary nodes normal. Resp: clear to auscultation  bilaterally Back: symmetric, no curvature. ROM normal. No CVA tenderness. Cardio: regular rate and rhythm, S1, S2 normal, no murmur, click, rub or gallop GI: soft, non-tender; bowel sounds normal; no masses,  no organomegaly Extremities: extremities normal, atraumatic, no cyanosis or edema  ECOG PERFORMANCE STATUS: 1 - Symptomatic but completely ambulatory  Blood pressure (!) 115/52, pulse 73, temperature 98.7 F (37.1 C), temperature source Oral, resp. rate 18, weight 209 lb (94.8 kg), SpO2 95 %.  LABORATORY DATA: Lab Results  Component Value Date   WBC 12.1 (  H) 03/13/2019   HGB 12.1 03/13/2019   HCT 37.6 03/13/2019   MCV 99.2 03/13/2019   PLT 277 03/13/2019      Chemistry      Component Value Date/Time   NA 142 03/13/2019 1140   K 4.3 03/13/2019 1140   CL 106 03/13/2019 1140   CO2 28 03/13/2019 1140   BUN 16 03/13/2019 1140   CREATININE 1.09 (H) 03/13/2019 1140      Component Value Date/Time   CALCIUM 8.7 (L) 03/13/2019 1140   ALKPHOS 116 03/13/2019 1140   AST 17 03/13/2019 1140   ALT 27 03/13/2019 1140   BILITOT 0.2 (L) 03/13/2019 1140       RADIOGRAPHIC STUDIES: Mr Jeri Cos IR Contrast  Result Date: 03/03/2019 CLINICAL DATA:  Newly diagnosed non-small cell lung cancer. Staging. EXAM: MRI HEAD WITHOUT AND WITH CONTRAST TECHNIQUE: Multiplanar, multiecho pulse sequences of the brain and surrounding structures were obtained without and with intravenous contrast. CONTRAST:  16m MULTIHANCE GADOBENATE DIMEGLUMINE 529 MG/ML IV SOLN COMPARISON:  None. FINDINGS: Brain: There is no evidence of acute infarct, intracranial hemorrhage, mass, midline shift, or extra-axial fluid collection. Scattered small foci of T2 hyperintensity in the cerebral white matter nonspecific but compatible with mild chronic small vessel ischemic disease. The ventricles and sulci are within normal limits for age. No abnormal enhancement is identified. Vascular: Major intracranial vascular flow voids are  preserved. Skull and upper cervical spine: Unremarkable bone marrow signal. Artifact in the cervical spine related to prior anterior fusion. Sinuses/Orbits: Unremarkable orbits. Clear paranasal sinuses. Trace mastoid fluid bilaterally. Other: None. IMPRESSION: 1. No evidence of intracranial metastases. 2. Mild chronic small vessel ischemic disease. Electronically Signed   By: ALogan BoresM.D.   On: 03/03/2019 16:36   Ir Imaging Guided Port Insertion  Result Date: 03/07/2019 INDICATION: History of lung cancer, in need of durable intravenous access for chemotherapy administration. EXAM: IMPLANTED PORT A CATH PLACEMENT WITH ULTRASOUND AND FLUOROSCOPIC GUIDANCE COMPARISON:  PET CT-01/19/2019 MEDICATIONS: Ancef 2 gm IV; The antibiotic was administered within an appropriate time interval prior to skin puncture. ANESTHESIA/SEDATION: Moderate (conscious) sedation was employed during this procedure. A total of Versed 4 mg and Fentanyl 100 mcg was administered intravenously. Moderate Sedation Time: 28 minutes. The patient's level of consciousness and vital signs were monitored continuously by radiology nursing throughout the procedure under my direct supervision. CONTRAST:  None FLUOROSCOPY TIME:  30 seconds (14 mGy) COMPLICATIONS: None immediate. PROCEDURE: The procedure, risks, benefits, and alternatives were explained to the patient. Questions regarding the procedure were encouraged and answered. The patient understands and consents to the procedure. The right neck and chest were prepped with chlorhexidine in a sterile fashion, and a sterile drape was applied covering the operative field. Maximum barrier sterile technique with sterile gowns and gloves were used for the procedure. A timeout was performed prior to the initiation of the procedure. Local anesthesia was provided with 1% lidocaine with epinephrine. After creating a small venotomy incision, a micropuncture kit was utilized to access the internal jugular  vein. Real-time ultrasound guidance was utilized for vascular access including the acquisition of a permanent ultrasound image documenting patency of the accessed vessel. The microwire was utilized to measure appropriate catheter length. A subcutaneous port pocket was then created along the upper chest wall utilizing a combination of sharp and blunt dissection. The pocket was irrigated with sterile saline. A single lumen ISP power injectable port was chosen for placement. The 8 Fr catheter was tunneled from the port  pocket site to the venotomy incision. The port was placed in the pocket. The external catheter was trimmed to appropriate length. At the venotomy, an 8 Fr peel-away sheath was placed over a guidewire under fluoroscopic guidance. The catheter was then placed through the sheath and the sheath was removed. Final catheter positioning was confirmed and documented with a fluoroscopic spot radiograph. The port was accessed with a Huber needle, aspirated and flushed with heparinized saline. The venotomy site was closed with an interrupted 4-0 Vicryl suture. The port pocket incision was closed with interrupted 2-0 Vicryl suture and the skin was opposed with a running subcuticular 4-0 Vicryl suture. Dermabond and Steri-strips were applied to both incisions. Dressings were placed. The patient tolerated the procedure well without immediate post procedural complication. FINDINGS: After catheter placement, the tip lies within the superior cavoatrial junction. The catheter aspirates and flushes normally and is ready for immediate use. IMPRESSION: Successful placement of a right internal jugular approach power injectable Port-A-Cath. The catheter is ready for immediate use. Electronically Signed   By: Sandi Mariscal M.D.   On: 03/07/2019 14:09    ASSESSMENT AND PLAN: This is a very pleasant 63 years old white female with currently unresectable stage IIIb non-small cell lung cancer, squamous cell carcinoma. The patient  was supposed to start a course of concurrent chemoradiation with weekly carboplatin and paclitaxel.  She started her chemotherapy but unfortunately her radiation has not started yet in [place]. I will call Dr. Danny Lawless to find out why the patient has not started her treatment yet.  She was supposed to start a few weeks ago. I recommended for her to proceed with her weekly chemotherapy as planned and will have a few more weekly doses concurrent with radiation. I will see the patient back for follow-up visit in 2 weeks for evaluation before starting cycle #6. She was advised to call immediately if she has any concerning symptoms in the interval. The patient voices understanding of current disease status and treatment options and is in agreement with the current care plan.  All questions were answered. The patient knows to call the clinic with any problems, questions or concerns. We can certainly see the patient much sooner if necessary.  I spent 10 minutes counseling the patient face to face. The total time spent in the appointment was 15 minutes.  Disclaimer: This note was dictated with voice recognition software. Similar sounding words can inadvertently be transcribed and may not be corrected upon review.

## 2019-03-21 NOTE — Patient Instructions (Signed)
[facility] Discharge Instructions for Patients Receiving Chemotherapy  Today you received the following chemotherapy agents:  Taxol, Carboplatin  To help prevent nausea and vomiting after your treatment, we encourage you to take your nausea medication as prescribed.   If you develop nausea and vomiting that is not controlled by your nausea medication, call the clinic.   BELOW ARE SYMPTOMS THAT SHOULD BE REPORTED IMMEDIATELY:  *FEVER GREATER THAN 100.5 F  *CHILLS WITH OR WITHOUT FEVER  NAUSEA AND VOMITING THAT IS NOT CONTROLLED WITH YOUR NAUSEA MEDICATION  *UNUSUAL SHORTNESS OF BREATH  *UNUSUAL BRUISING OR BLEEDING  TENDERNESS IN MOUTH AND THROAT WITH OR WITHOUT PRESENCE OF ULCERS  *URINARY PROBLEMS  *BOWEL PROBLEMS  UNUSUAL RASH Items with * indicate a potential emergency and should be followed up as soon as possible.  Feel free to call the clinic should you have any questions or concerns. The clinic phone number is [phone].  Please show the [facility] at check-in to the Emergency Department and triage nurse.   Coronavirus (COVID-19) Are you at risk?  Are you at risk for the Coronavirus (COVID-19)?  To be considered HIGH RISK for Coronavirus (COVID-19), you have to meet the following criteria:  . Traveled to [place], [place], [place], [place] or [place]; or in the [place] to [place], [place], [place], or [place]; and have fever, cough, and shortness of breath within the last 2 weeks of travel OR . Been in close contact with a person diagnosed with COVID-19 within the last 2 weeks and have fever, cough, and shortness of breath . IF YOU DO NOT MEET THESE CRITERIA, YOU ARE CONSIDERED LOW RISK FOR COVID-19.  What to do if you are HIGH RISK for COVID-19?  Marland Kitchen If you are having a medical emergency, call 911. . Seek medical care right away. Before you go to a doctor's office, urgent care or emergency department, call ahead and tell them  about your recent travel, contact with someone diagnosed with COVID-19, and your symptoms. You should receive instructions from your physician's office regarding next steps of care.  . When you arrive at healthcare provider, tell the healthcare staff immediately you have returned from visiting [place], [place], [place], [place] or [place]; or traveled in the [place] to [place], [place], [place], or [place]; in the last two weeks or you have been in close contact with a person diagnosed with COVID-19 in the last 2 weeks.   . Tell the health care staff about your symptoms: fever, cough and shortness of breath. . After you have been seen by a medical provider, you will be either: o Tested for (COVID-19) and discharged home on quarantine except to seek medical care if symptoms worsen, and asked to  - Stay home and avoid contact with others until you get your results (4-5 days)  - Avoid travel on public transportation if possible (such as bus, train, or airplane) or o Sent to the Emergency Department by EMS for evaluation, COVID-19 testing, and possible admission depending on your condition and test results.  What to do if you are LOW RISK for COVID-19?  Reduce your risk of any infection by using the same precautions used for avoiding the common cold or flu:  Marland Kitchen Wash your hands often with soap and warm water for at least 20 seconds.  If soap and water are not readily available, use an alcohol-based hand sanitizer with at least 60% alcohol.  . If  coughing or sneezing, cover your mouth and nose by coughing or sneezing into the elbow areas of your shirt or coat, into a tissue or into your sleeve (not your hands). . Avoid shaking hands with others and consider head nods or verbal greetings only. . Avoid touching your eyes, nose, or mouth with unwashed hands.  . Avoid close contact with people who are sick. . Avoid places or events with large numbers of people in one location, like concerts or  sporting events. . Carefully consider travel plans you have or are making. . If you are planning any travel outside or inside the [place], visit the CDC's Travelers' Health webpage for the latest health notices. . If you have some symptoms but not all symptoms, continue to monitor at home and seek medical attention if your symptoms worsen. . If you are having a medical emergency, call 911.   [place] / e-Visit: [email or web address]         [facility]: [facility]: [phone]                   [facility]: [phone]

## 2019-03-21 NOTE — Patient Instructions (Signed)

## 2019-03-22 ENCOUNTER — Telehealth: Payer: Self-pay | Admitting: *Deleted

## 2019-03-22 ENCOUNTER — Telehealth: Payer: Self-pay | Admitting: Internal Medicine

## 2019-03-22 NOTE — Telephone Encounter (Signed)
Received call from [place] from [facility] in [place]. She is calling to confirm pt will have CT [facility] on Friday 03/24/19 there.  They are waiting on pt's records from here as well. Informed her that pt will continue chemo treatments here.  No other questions or concerns

## 2019-03-22 NOTE — Telephone Encounter (Signed)
Scheduled appt per 5/26 los - pt to get an updated schedule next visit.

## 2019-03-24 ENCOUNTER — Telehealth: Payer: Self-pay | Admitting: *Deleted

## 2019-03-24 NOTE — Telephone Encounter (Signed)
Received called from [place], [place] from Dr. Ezzard Standing office requesting change in appt time on 6/1 chemo appt to allow for pt to receive xrt in [place] by 2pm. High priority inbasket sent to change time and call pt with new appt.

## 2019-03-27 ENCOUNTER — Inpatient Hospital Stay: Payer: Medicare Other

## 2019-03-27 ENCOUNTER — Encounter: Payer: Self-pay | Admitting: *Deleted

## 2019-03-27 ENCOUNTER — Inpatient Hospital Stay: Payer: Medicare Other | Attending: Internal Medicine

## 2019-03-27 ENCOUNTER — Other Ambulatory Visit: Payer: Self-pay

## 2019-03-27 ENCOUNTER — Ambulatory Visit: Payer: Medicare Other

## 2019-03-27 VITALS — BP 131/56 | HR 75 | Temp 98.5°F | Resp 18 | Wt 211.0 lb

## 2019-03-27 DIAGNOSIS — C3412 Malignant neoplasm of upper lobe, left bronchus or lung: Secondary | ICD-10-CM | POA: Insufficient documentation

## 2019-03-27 DIAGNOSIS — C3492 Malignant neoplasm of unspecified part of left bronchus or lung: Secondary | ICD-10-CM

## 2019-03-27 DIAGNOSIS — Z5111 Encounter for antineoplastic chemotherapy: Secondary | ICD-10-CM | POA: Diagnosis not present

## 2019-03-27 LAB — CMP (CANCER CENTER ONLY)
ALT: 34 U/L (ref 0–44)
AST: 17 U/L (ref 15–41)
Albumin: 3.2 g/dL — ABNORMAL LOW (ref 3.5–5.0)
Alkaline Phosphatase: 98 U/L (ref 38–126)
Anion gap: 10 (ref 5–15)
BUN: 16 mg/dL (ref 8–23)
CO2: 26 mmol/L (ref 22–32)
Calcium: 8.8 mg/dL — ABNORMAL LOW (ref 8.9–10.3)
Chloride: 103 mmol/L (ref 98–111)
Creatinine: 1.22 mg/dL — ABNORMAL HIGH (ref 0.44–1.00)
GFR, Est AFR Am: 54 mL/min — ABNORMAL LOW (ref >60)
GFR, Estimated: 46 mL/min — ABNORMAL LOW (ref >60)
Glucose, Bld: 177 mg/dL — ABNORMAL HIGH (ref 70–99)
Potassium: 3.9 mmol/L (ref 3.5–5.1)
Sodium: 139 mmol/L (ref 135–145)
Total Bilirubin: 0.5 mg/dL (ref 0.3–1.2)
Total Protein: 6.6 g/dL (ref 6.5–8.1)

## 2019-03-27 LAB — CBC WITH DIFFERENTIAL (CANCER CENTER ONLY)
Abs Immature Granulocytes: 0.04 10*3/uL (ref 0.00–0.07)
Basophils Absolute: 0 10*3/uL (ref 0.0–0.1)
Basophils Relative: 0 %
Eosinophils Absolute: 0 10*3/uL (ref 0.0–0.5)
Eosinophils Relative: 0 %
HCT: 37.4 % (ref 36.0–46.0)
Hemoglobin: 12.4 g/dL (ref 12.0–15.0)
Immature Granulocytes: 1 %
Lymphocytes Relative: 41 %
Lymphs Abs: 3.3 10*3/uL (ref 0.7–4.0)
MCH: 32.6 pg (ref 26.0–34.0)
MCHC: 33.2 g/dL (ref 30.0–36.0)
MCV: 98.4 fL (ref 80.0–100.0)
Monocytes Absolute: 0.4 10*3/uL (ref 0.1–1.0)
Monocytes Relative: 5 %
Neutro Abs: 4.2 10*3/uL (ref 1.7–7.7)
Neutrophils Relative %: 53 %
Platelet Count: 145 10*3/uL — ABNORMAL LOW (ref 150–400)
RBC: 3.8 MIL/uL — ABNORMAL LOW (ref 3.87–5.11)
RDW: 14.5 % (ref 11.5–15.5)
WBC Count: 7.9 10*3/uL (ref 4.0–10.5)
nRBC: 0 % (ref 0.0–0.2)

## 2019-03-27 MED ORDER — SODIUM CHLORIDE 0.9 % IV SOLN
180.0000 mg | Freq: Once | INTRAVENOUS | Status: AC
  Administered 2019-03-27: 180 mg via INTRAVENOUS
  Filled 2019-03-27: qty 18

## 2019-03-27 MED ORDER — HEPARIN SOD (PORK) LOCK FLUSH 100 UNIT/ML IV SOLN
500.0000 [IU] | Freq: Once | INTRAVENOUS | Status: AC | PRN
  Administered 2019-03-27: 12:00:00 500 [IU]
  Filled 2019-03-27: qty 5

## 2019-03-27 MED ORDER — FAMOTIDINE IN NACL 20-0.9 MG/50ML-% IV SOLN
INTRAVENOUS | Status: AC
  Filled 2019-03-27: qty 50

## 2019-03-27 MED ORDER — SODIUM CHLORIDE 0.9 % IV SOLN
Freq: Once | INTRAVENOUS | Status: AC
  Administered 2019-03-27: 09:00:00 via INTRAVENOUS
  Filled 2019-03-27: qty 250

## 2019-03-27 MED ORDER — PALONOSETRON HCL INJECTION 0.25 MG/5ML
INTRAVENOUS | Status: AC
  Filled 2019-03-27: qty 5

## 2019-03-27 MED ORDER — DIPHENHYDRAMINE HCL 50 MG/ML IJ SOLN
50.0000 mg | Freq: Once | INTRAMUSCULAR | Status: AC
  Administered 2019-03-27: 09:00:00 50 mg via INTRAVENOUS

## 2019-03-27 MED ORDER — FAMOTIDINE IN NACL 20-0.9 MG/50ML-% IV SOLN
20.0000 mg | Freq: Once | INTRAVENOUS | Status: AC
  Administered 2019-03-27: 09:00:00 20 mg via INTRAVENOUS

## 2019-03-27 MED ORDER — PALONOSETRON HCL INJECTION 0.25 MG/5ML
0.2500 mg | Freq: Once | INTRAVENOUS | Status: AC
  Administered 2019-03-27: 09:00:00 0.25 mg via INTRAVENOUS

## 2019-03-27 MED ORDER — DIPHENHYDRAMINE HCL 50 MG/ML IJ SOLN
INTRAMUSCULAR | Status: AC
  Filled 2019-03-27: qty 1

## 2019-03-27 MED ORDER — SODIUM CHLORIDE 0.9 % IV SOLN
20.0000 mg | Freq: Once | INTRAVENOUS | Status: AC
  Administered 2019-03-27: 10:00:00 20 mg via INTRAVENOUS
  Filled 2019-03-27: qty 20

## 2019-03-27 MED ORDER — SODIUM CHLORIDE 0.9 % IV SOLN
45.0000 mg/m2 | Freq: Once | INTRAVENOUS | Status: AC
  Administered 2019-03-27: 90 mg via INTRAVENOUS
  Filled 2019-03-27: qty 15

## 2019-03-27 MED ORDER — SODIUM CHLORIDE 0.9% FLUSH
10.0000 mL | INTRAVENOUS | Status: DC | PRN
  Administered 2019-03-27: 12:00:00 10 mL
  Filled 2019-03-27: qty 10

## 2019-03-27 NOTE — Patient Instructions (Signed)
[facility] Discharge Instructions for Patients Receiving Chemotherapy  Today you received the following chemotherapy agents:  Taxol, Carboplatin  To help prevent nausea and vomiting after your treatment, we encourage you to take your nausea medication as prescribed.   If you develop nausea and vomiting that is not controlled by your nausea medication, call the clinic.   BELOW ARE SYMPTOMS THAT SHOULD BE REPORTED IMMEDIATELY:  *FEVER GREATER THAN 100.5 F  *CHILLS WITH OR WITHOUT FEVER  NAUSEA AND VOMITING THAT IS NOT CONTROLLED WITH YOUR NAUSEA MEDICATION  *UNUSUAL SHORTNESS OF BREATH  *UNUSUAL BRUISING OR BLEEDING  TENDERNESS IN MOUTH AND THROAT WITH OR WITHOUT PRESENCE OF ULCERS  *URINARY PROBLEMS  *BOWEL PROBLEMS  UNUSUAL RASH Items with * indicate a potential emergency and should be followed up as soon as possible.  Feel free to call the clinic should you have any questions or concerns. The clinic phone number is [phone].  Please show the [facility] at check-in to the Emergency Department and triage nurse.   Coronavirus (COVID-19) Are you at risk?  Are you at risk for the Coronavirus (COVID-19)?  To be considered HIGH RISK for Coronavirus (COVID-19), you have to meet the following criteria:  . Traveled to [place], [place], [place], [place] or [place]; or in the [place] to [place], [place], [place], or [place]; and have fever, cough, and shortness of breath within the last 2 weeks of travel OR . Been in close contact with a person diagnosed with COVID-19 within the last 2 weeks and have fever, cough, and shortness of breath . IF YOU DO NOT MEET THESE CRITERIA, YOU ARE CONSIDERED LOW RISK FOR COVID-19.  What to do if you are HIGH RISK for COVID-19?  Marland Kitchen If you are having a medical emergency, call 911. . Seek medical care right away. Before you go to a doctor's office, urgent care or emergency department, call ahead and tell them  about your recent travel, contact with someone diagnosed with COVID-19, and your symptoms. You should receive instructions from your physician's office regarding next steps of care.  . When you arrive at healthcare provider, tell the healthcare staff immediately you have returned from visiting [place], [place], [place], [place] or [place]; or traveled in the [place] to [place], [place], [place], or [place]; in the last two weeks or you have been in close contact with a person diagnosed with COVID-19 in the last 2 weeks.   . Tell the health care staff about your symptoms: fever, cough and shortness of breath. . After you have been seen by a medical provider, you will be either: o Tested for (COVID-19) and discharged home on quarantine except to seek medical care if symptoms worsen, and asked to  - Stay home and avoid contact with others until you get your results (4-5 days)  - Avoid travel on public transportation if possible (such as bus, train, or airplane) or o Sent to the Emergency Department by EMS for evaluation, COVID-19 testing, and possible admission depending on your condition and test results.  What to do if you are LOW RISK for COVID-19?  Reduce your risk of any infection by using the same precautions used for avoiding the common cold or flu:  Marland Kitchen Wash your hands often with soap and warm water for at least 20 seconds.  If soap and water are not readily available, use an alcohol-based hand sanitizer with at least 60% alcohol.  . If  coughing or sneezing, cover your mouth and nose by coughing or sneezing into the elbow areas of your shirt or coat, into a tissue or into your sleeve (not your hands). . Avoid shaking hands with others and consider head nods or verbal greetings only. . Avoid touching your eyes, nose, or mouth with unwashed hands.  . Avoid close contact with people who are sick. . Avoid places or events with large numbers of people in one location, like concerts or  sporting events. . Carefully consider travel plans you have or are making. . If you are planning any travel outside or inside the [place], visit the CDC's Travelers' Health webpage for the latest health notices. . If you have some symptoms but not all symptoms, continue to monitor at home and seek medical attention if your symptoms worsen. . If you are having a medical emergency, call 911.   [place] / e-Visit: [email or web address]         [facility]: [facility]: [phone]                   [facility]: [phone]

## 2019-03-27 NOTE — Progress Notes (Signed)
Oncology Nurse Navigator Documentation  Oncology Nurse Navigator Flowsheets 03/27/2019  Navigator Location [facility]  Referral date to RadOnc/MedOnc -  Navigator Encounter Type Other/I received a call from [employer] requesting patient's chemo schedule.  I faxed a copy of her chemo and other appts.    Telephone -  [facility] Date -  Patient Visit Type -  Treatment Phase Treatment  Barriers/Navigation Needs Coordination of Care  Education -  Interventions Coordination of Care  Coordination of Care Other  Education Method -  Acuity Level 2  Time Spent with Patient 30

## 2019-03-30 ENCOUNTER — Telehealth: Payer: Self-pay | Admitting: Internal Medicine

## 2019-03-30 NOTE — Telephone Encounter (Signed)
Returned call to patient re request to have lab drawn from port. Added port flush to each lab appointment 6/8 thru 7/6. Times have been adjusted to be 15 minutes earlier. Confirmed with patient. Patient will get updated schedule 6/8.

## 2019-03-31 ENCOUNTER — Other Ambulatory Visit: Payer: Self-pay | Admitting: Medical

## 2019-03-31 ENCOUNTER — Telehealth: Payer: Self-pay | Admitting: *Deleted

## 2019-03-31 MED ORDER — LIDOCAINE VISCOUS HCL 2 % MT SOLN: 5.0000 mL | OROMUCOSAL | 2 refills | Status: DC | PRN

## 2019-03-31 MED ORDER — MAGIC MOUTHWASH: 5.0000 mL | Freq: Four times a day (QID) | ORAL | 3 refills | Status: DC | PRN

## 2019-03-31 MED ORDER — SUCRALFATE 1 G PO TABS: 1.0000 g | ORAL_TABLET | Freq: Three times a day (TID) | ORAL | 2 refills | Status: DC

## 2019-03-31 NOTE — Telephone Encounter (Signed)
Received vm from pt's daughter.  She states that pt is experiencing severe mouth pain and sever indigestion. Pt had chemo on 03/27/19 with Taxol/Carbo.  She has been taking prilosec 20 mg BID w/o relief Discussed with Sandi Mealy, PA as Dr. Julien Nordmann not in the office this afternoon.  He will send in prescriptions for Magic mouthwash, Carafate and viscous lidocaine. TCT patient to make her aware of this.  Spoke with her and she voiced understanding of medications.

## 2019-04-03 ENCOUNTER — Inpatient Hospital Stay: Payer: Medicare Other

## 2019-04-03 ENCOUNTER — Other Ambulatory Visit: Payer: Medicare Other

## 2019-04-03 ENCOUNTER — Encounter: Payer: Self-pay | Admitting: Internal Medicine

## 2019-04-03 ENCOUNTER — Inpatient Hospital Stay (HOSPITAL_BASED_OUTPATIENT_CLINIC_OR_DEPARTMENT_OTHER): Payer: Medicare Other | Admitting: Internal Medicine

## 2019-04-03 ENCOUNTER — Other Ambulatory Visit: Payer: Self-pay

## 2019-04-03 VITALS — BP 107/64 | HR 76 | Temp 99.0°F | Resp 18 | Ht 62.0 in | Wt 208.3 lb

## 2019-04-03 DIAGNOSIS — Z95828 Presence of other vascular implants and grafts: Secondary | ICD-10-CM

## 2019-04-03 DIAGNOSIS — Z5111 Encounter for antineoplastic chemotherapy: Secondary | ICD-10-CM

## 2019-04-03 DIAGNOSIS — C3492 Malignant neoplasm of unspecified part of left bronchus or lung: Secondary | ICD-10-CM

## 2019-04-03 DIAGNOSIS — K1379 Other lesions of oral mucosa: Secondary | ICD-10-CM | POA: Diagnosis not present

## 2019-04-03 DIAGNOSIS — R070 Pain in throat: Secondary | ICD-10-CM | POA: Diagnosis not present

## 2019-04-03 DIAGNOSIS — C3412 Malignant neoplasm of upper lobe, left bronchus or lung: Secondary | ICD-10-CM | POA: Diagnosis not present

## 2019-04-03 DIAGNOSIS — R5383 Other fatigue: Secondary | ICD-10-CM | POA: Diagnosis not present

## 2019-04-03 LAB — CBC WITH DIFFERENTIAL (CANCER CENTER ONLY)
Abs Immature Granulocytes: 0.03 10*3/uL (ref 0.00–0.07)
Basophils Absolute: 0 10*3/uL (ref 0.0–0.1)
Basophils Relative: 0 %
Eosinophils Absolute: 0 10*3/uL (ref 0.0–0.5)
Eosinophils Relative: 1 %
HCT: 32.7 % — ABNORMAL LOW (ref 36.0–46.0)
Hemoglobin: 11 g/dL — ABNORMAL LOW (ref 12.0–15.0)
Immature Granulocytes: 1 %
Lymphocytes Relative: 35 %
Lymphs Abs: 2.3 10*3/uL (ref 0.7–4.0)
MCH: 32.5 pg (ref 26.0–34.0)
MCHC: 33.6 g/dL (ref 30.0–36.0)
MCV: 96.7 fL (ref 80.0–100.0)
Monocytes Absolute: 0.3 10*3/uL (ref 0.1–1.0)
Monocytes Relative: 5 %
Neutro Abs: 3.9 10*3/uL (ref 1.7–7.7)
Neutrophils Relative %: 58 %
Platelet Count: 126 10*3/uL — ABNORMAL LOW (ref 150–400)
RBC: 3.38 MIL/uL — ABNORMAL LOW (ref 3.87–5.11)
RDW: 14.6 % (ref 11.5–15.5)
WBC Count: 6.6 10*3/uL (ref 4.0–10.5)
nRBC: 0 % (ref 0.0–0.2)

## 2019-04-03 LAB — CMP (CANCER CENTER ONLY)
ALT: 27 U/L (ref 0–44)
AST: 16 U/L (ref 15–41)
Albumin: 3.1 g/dL — ABNORMAL LOW (ref 3.5–5.0)
Alkaline Phosphatase: 88 U/L (ref 38–126)
Anion gap: 11 (ref 5–15)
BUN: 18 mg/dL (ref 8–23)
CO2: 25 mmol/L (ref 22–32)
Calcium: 8.2 mg/dL — ABNORMAL LOW (ref 8.9–10.3)
Chloride: 104 mmol/L (ref 98–111)
Creatinine: 1.23 mg/dL — ABNORMAL HIGH (ref 0.44–1.00)
GFR, Est AFR Am: 53 mL/min — ABNORMAL LOW (ref >60)
GFR, Estimated: 46 mL/min — ABNORMAL LOW (ref >60)
Glucose, Bld: 124 mg/dL — ABNORMAL HIGH (ref 70–99)
Potassium: 3.5 mmol/L (ref 3.5–5.1)
Sodium: 140 mmol/L (ref 135–145)
Total Bilirubin: 0.4 mg/dL (ref 0.3–1.2)
Total Protein: 6.5 g/dL (ref 6.5–8.1)

## 2019-04-03 MED ORDER — SODIUM CHLORIDE 0.9 % IV SOLN: 212.4000 mg | Freq: Once | INTRAVENOUS | Status: DC

## 2019-04-03 MED ORDER — SODIUM CHLORIDE 0.9% FLUSH
10.0000 mL | INTRAVENOUS | Status: DC | PRN
  Administered 2019-04-03: 10 mL
  Filled 2019-04-03: qty 10

## 2019-04-03 MED ORDER — SODIUM CHLORIDE 0.9 % IV SOLN
45.0000 mg/m2 | Freq: Once | INTRAVENOUS | Status: AC
  Administered 2019-04-03: 90 mg via INTRAVENOUS
  Filled 2019-04-03: qty 15

## 2019-04-03 MED ORDER — PALONOSETRON HCL INJECTION 0.25 MG/5ML
0.2500 mg | Freq: Once | INTRAVENOUS | Status: AC
  Administered 2019-04-03: 0.25 mg via INTRAVENOUS

## 2019-04-03 MED ORDER — FAMOTIDINE IN NACL 20-0.9 MG/50ML-% IV SOLN
20.0000 mg | Freq: Once | INTRAVENOUS | Status: AC
  Administered 2019-04-03: 20 mg via INTRAVENOUS

## 2019-04-03 MED ORDER — SODIUM CHLORIDE 0.9 % IV SOLN
20.0000 mg | Freq: Once | INTRAVENOUS | Status: AC
  Administered 2019-04-03: 20 mg via INTRAVENOUS
  Filled 2019-04-03: qty 20

## 2019-04-03 MED ORDER — SODIUM CHLORIDE 0.9 % IV SOLN
Freq: Once | INTRAVENOUS | Status: AC
  Administered 2019-04-03: 12:00:00 via INTRAVENOUS
  Filled 2019-04-03: qty 250

## 2019-04-03 MED ORDER — SODIUM CHLORIDE 0.9 % IV SOLN
185.4000 mg | Freq: Once | INTRAVENOUS | Status: AC
  Administered 2019-04-03: 190 mg via INTRAVENOUS
  Filled 2019-04-03: qty 19

## 2019-04-03 MED ORDER — PALONOSETRON HCL INJECTION 0.25 MG/5ML
INTRAVENOUS | Status: AC
  Filled 2019-04-03: qty 5

## 2019-04-03 MED ORDER — FAMOTIDINE IN NACL 20-0.9 MG/50ML-% IV SOLN
INTRAVENOUS | Status: AC
  Filled 2019-04-03: qty 50

## 2019-04-03 MED ORDER — HEPARIN SOD (PORK) LOCK FLUSH 100 UNIT/ML IV SOLN
500.0000 [IU] | Freq: Once | INTRAVENOUS | Status: AC | PRN
  Administered 2019-04-03: 500 [IU]
  Filled 2019-04-03: qty 5

## 2019-04-03 MED ORDER — DIPHENHYDRAMINE HCL 50 MG/ML IJ SOLN
INTRAMUSCULAR | Status: AC
  Filled 2019-04-03: qty 1

## 2019-04-03 MED ORDER — DIPHENHYDRAMINE HCL 50 MG/ML IJ SOLN
50.0000 mg | Freq: Once | INTRAMUSCULAR | Status: AC
  Administered 2019-04-03: 50 mg via INTRAVENOUS

## 2019-04-03 NOTE — Patient Instructions (Signed)
[facility] Discharge Instructions for Patients Receiving Chemotherapy  Today you received the following chemotherapy agents:  Taxol, Carboplatin  To help prevent nausea and vomiting after your treatment, we encourage you to take your nausea medication as prescribed.   If you develop nausea and vomiting that is not controlled by your nausea medication, call the clinic.   BELOW ARE SYMPTOMS THAT SHOULD BE REPORTED IMMEDIATELY:  *FEVER GREATER THAN 100.5 F  *CHILLS WITH OR WITHOUT FEVER  NAUSEA AND VOMITING THAT IS NOT CONTROLLED WITH YOUR NAUSEA MEDICATION  *UNUSUAL SHORTNESS OF BREATH  *UNUSUAL BRUISING OR BLEEDING  TENDERNESS IN MOUTH AND THROAT WITH OR WITHOUT PRESENCE OF ULCERS  *URINARY PROBLEMS  *BOWEL PROBLEMS  UNUSUAL RASH Items with * indicate a potential emergency and should be followed up as soon as possible.  Feel free to call the clinic should you have any questions or concerns. The clinic phone number is [phone].  Please show the [facility] at check-in to the Emergency Department and triage nurse.   Coronavirus (COVID-19) Are you at risk?  Are you at risk for the Coronavirus (COVID-19)?  To be considered HIGH RISK for Coronavirus (COVID-19), you have to meet the following criteria:  . Traveled to [place], [place], [place], [place] or [place]; or in the [place] to [place], [place], [place], or [place]; and have fever, cough, and shortness of breath within the last 2 weeks of travel OR . Been in close contact with a person diagnosed with COVID-19 within the last 2 weeks and have fever, cough, and shortness of breath . IF YOU DO NOT MEET THESE CRITERIA, YOU ARE CONSIDERED LOW RISK FOR COVID-19.  What to do if you are HIGH RISK for COVID-19?  Marland Kitchen If you are having a medical emergency, call 911. . Seek medical care right away. Before you go to a doctor's office, urgent care or emergency department, call ahead and tell them  about your recent travel, contact with someone diagnosed with COVID-19, and your symptoms. You should receive instructions from your physician's office regarding next steps of care.  . When you arrive at healthcare provider, tell the healthcare staff immediately you have returned from visiting [place], [place], [place], [place] or [place]; or traveled in the [place] to [place], [place], [place], or [place]; in the last two weeks or you have been in close contact with a person diagnosed with COVID-19 in the last 2 weeks.   . Tell the health care staff about your symptoms: fever, cough and shortness of breath. . After you have been seen by a medical provider, you will be either: o Tested for (COVID-19) and discharged home on quarantine except to seek medical care if symptoms worsen, and asked to  - Stay home and avoid contact with others until you get your results (4-5 days)  - Avoid travel on public transportation if possible (such as bus, train, or airplane) or o Sent to the Emergency Department by EMS for evaluation, COVID-19 testing, and possible admission depending on your condition and test results.  What to do if you are LOW RISK for COVID-19?  Reduce your risk of any infection by using the same precautions used for avoiding the common cold or flu:  Marland Kitchen Wash your hands often with soap and warm water for at least 20 seconds.  If soap and water are not readily available, use an alcohol-based hand sanitizer with at least 60% alcohol.  . If  coughing or sneezing, cover your mouth and nose by coughing or sneezing into the elbow areas of your shirt or coat, into a tissue or into your sleeve (not your hands). . Avoid shaking hands with others and consider head nods or verbal greetings only. . Avoid touching your eyes, nose, or mouth with unwashed hands.  . Avoid close contact with people who are sick. . Avoid places or events with large numbers of people in one location, like concerts or  sporting events. . Carefully consider travel plans you have or are making. . If you are planning any travel outside or inside the [place], visit the CDC's Travelers' Health webpage for the latest health notices. . If you have some symptoms but not all symptoms, continue to monitor at home and seek medical attention if your symptoms worsen. . If you are having a medical emergency, call 911.   [place] / e-Visit: [email or web address]         [facility]: [facility]: [phone]                   [facility]: [phone]

## 2019-04-03 NOTE — Progress Notes (Signed)
[place] [phone]   [phone]  OFFICE PROGRESS NOTE  [name], FNP [name] [address]  DIAGNOSIS: stage IIb (T2b, N1, M0) non-small cell lung cancer, squamous cell carcinoma presented with right upper lobe lung mass in addition to left hilar adenopathy.  There was also a suspicious groundglass opacity bilaterally.  PRIOR THERAPY: None.  CURRENT THERAPY: Concurrent chemoradiation with weekly carboplatin for AUC of 4 and paclitaxel 45 mg/M2.  Status post 5 cycles.  She received her radiation in [facility].  INTERVAL HISTORY: Tonya Shelton 63 y.o. female returns to the clinic today for follow-up visit.  The patient is feeling fine with no concerning complaints except for mild fatigue as well as sore mouth and throat.  She was a started on Magic mouthwash as well as Carafate.  The patient denied having any chest pain, shortness of breath, cough or hemoptysis.  She denied having any fever or chills.  She has no nausea, vomiting, diarrhea or constipation.  She is here today for evaluation before starting cycle #6 of her treatment.  She started concurrent radiotherapy in [facility] last week.  MEDICAL HISTORY: Past Medical History:  Diagnosis Date  . Acid reflux   . Anemia    low iron  . Arthritis   . Chronic kidney disease    Stage 3 - Dr. Iona Beard in [place], [place]  . Depression   . Diabetes mellitus without complication ([place])    type 2  . Family history of adverse reaction to anesthesia    mom and sister have n/v  . H/O bladder problems    leaks  . History of hiatal hernia   . Hypertension   . Hypothyroidism   . Neuropathy   . Osteoporosis   . PVC's (premature ventricular contractions)   . Restless legs   . Sleep apnea    uses cpap with oxygen  . Stroke [facility]) 2007   TIA    ALLERGIES:  is allergic to erythromycin and povidone iodine.  MEDICATIONS:  Current Outpatient  Medications  Medication Sig Dispense Refill  . acetaminophen (TYLENOL) 500 MG tablet Take 1,000-1,500 mg by mouth daily as needed for moderate pain.     Marland Kitchen alendronate (FOSAMAX) 70 MG tablet Take 70 mg by mouth every Sunday.     Marland Kitchen aspirin EC 81 MG tablet Take 81 mg by mouth daily at 12 noon.    Marland Kitchen atorvastatin (LIPITOR) 40 MG tablet Take 40 mg by mouth at bedtime.     . carvedilol (COREG) 25 MG tablet Take 25 mg by mouth 2 (two) times daily with a meal.    . cevimeline (EVOXAC) 30 MG capsule Take 30 mg by mouth 2 (two) times daily.     . DULoxetine (CYMBALTA) 60 MG capsule Take 60 mg by mouth daily.    . ferrous sulfate 325 (65 FE) MG tablet Take 325 mg by mouth daily.   0  . furosemide (LASIX) 20 MG tablet Take 20 mg by mouth daily.  0  . gabapentin (NEURONTIN) 100 MG capsule Take 200 mg by mouth at bedtime.     Marland Kitchen glimepiride (AMARYL) 2 MG tablet Take 2 mg by mouth daily with breakfast.   0  . levothyroxine (SYNTHROID, LEVOTHROID) 88 MCG tablet Take 88 mcg by mouth daily before breakfast.    . lidocaine (XYLOCAINE) 2 % solution Use as directed 5 mLs in the mouth or throat every 3 (  three) hours as needed for mouth pain. Swish and swallow or spit 200 mL 2  . magic mouthwash SOLN Take 5 mLs by mouth 4 (four) times daily as needed for mouth pain. 240 mL 3  . Magnesium 500 MG TABS Take 500 mg by mouth daily.    . Misc Natural Products (OSTEO BI-FLEX ADV TRIPLE ST PO) Take 1 tablet by mouth at bedtime.     . Multiple Vitamins-Minerals (ALIVE WOMENS 50+ PO) Take 1 tablet by mouth daily.    . Omega-3 Fatty Acids (FISH OIL PO) Take 2,000 mg by mouth 2 (two) times daily.    Marland Kitchen omeprazole (PRILOSEC) 20 MG capsule Take 40 mg by mouth daily.     Marland Kitchen OVER THE COUNTER MEDICATION Place 1-2 tablets under the tongue at bedtime. Restful legs otc    . oxybutynin (DITROPAN-XL) 10 MG 24 hr tablet Take 10 mg by mouth daily.     . prochlorperazine (COMPAZINE) 10 MG tablet TAKE 1 TABLET(10 MG) BY MOUTH EVERY 6 HOURS AS  NEEDED FOR NAUSEA OR VOMITING 30 tablet 0  . sucralfate (CARAFATE) 1 g tablet Take 1 tablet (1 g total) by mouth 4 (four) times daily -  with meals and at bedtime. Dissolve in 1 ounce of water. Take 1 hour before or 2 hours after taking medications 120 tablet 2  . telmisartan (MICARDIS) 80 MG tablet Take 40 mg by mouth daily at 12 noon.     Marland Kitchen tiZANidine (ZANAFLEX) 4 MG tablet Take 8 mg by mouth at bedtime.     . TRADJENTA 5 MG TABS tablet Take 5 mg by mouth daily.  0  . UNABLE TO FIND Med Name: CPAP with 2lpm o2 bled in  [place]    . Vitamin D, Ergocalciferol, (DRISDOL) 50000 units CAPS capsule Take 50,000 Units by mouth every Sunday.     . nitroGLYCERIN (NITROSTAT) 0.4 MG SL tablet Place 0.4 mg under the tongue every 5 (five) minutes as needed for chest pain.     No current facility-administered medications for this visit.     SURGICAL HISTORY:  Past Surgical History:  Procedure Laterality Date  . BUNIONECTOMY    . GALLBLADDER SURGERY    . IR IMAGING GUIDED PORT INSERTION  03/07/2019  . LUMBAR LAMINECTOMY/DECOMPRESSION MICRODISCECTOMY N/A 10/15/2017   Procedure: LEFT L5-S1 MICRODISCECTOMY;  Surgeon: [name], MD;  Location: [facility];  Service: Orthopedics;  Laterality: N/A;  . NEUROMA SURGERY    . SPINAL FUSION  2003   cervical  . tonsillectomy    . VIDEO BRONCHOSCOPY WITH RADIAL ENDOBRONCHIAL ULTRASOUND N/A 02/09/2019   Procedure: VIDEO BRONCHOSCOPY WITH RADIAL ENDOBRONCHIAL ULTRASOUND;  Surgeon: [name], MD;  Location: [facility];  Service: Thoracic;  Laterality: N/A;    REVIEW OF SYSTEMS:  A comprehensive review of systems was negative except for: Constitutional: positive for fatigue Ears, nose, mouth, throat, and face: positive for sore mouth and sore throat   PHYSICAL EXAMINATION: General appearance: alert, cooperative, fatigued and no distress Head: Normocephalic, without obvious abnormality, atraumatic Neck: no adenopathy, no JVD, supple, symmetrical, trachea  midline and thyroid not enlarged, symmetric, no tenderness/mass/nodules Lymph nodes: Cervical, supraclavicular, and axillary nodes normal. Resp: clear to auscultation bilaterally Back: symmetric, no curvature. ROM normal. No CVA tenderness. Cardio: regular rate and rhythm, S1, S2 normal, no murmur, click, rub or gallop GI: soft, non-tender; bowel sounds normal; no masses,  no organomegaly Extremities: extremities normal, atraumatic, no cyanosis or edema  ECOG PERFORMANCE STATUS: 1 -  Symptomatic but completely ambulatory  Blood pressure 107/64, pulse 76, temperature 99 F (37.2 C), temperature source Oral, resp. rate 18, height '5\' 2"'$  (1.575 m), weight 208 lb 4.8 oz (94.5 kg), SpO2 98 %.  LABORATORY DATA: Lab Results  Component Value Date   WBC 6.6 04/03/2019   HGB 11.0 (L) 04/03/2019   HCT 32.7 (L) 04/03/2019   MCV 96.7 04/03/2019   PLT 126 (L) 04/03/2019      Chemistry      Component Value Date/Time   NA 140 04/03/2019 1115   K 3.5 04/03/2019 1115   CL 104 04/03/2019 1115   CO2 25 04/03/2019 1115   BUN 18 04/03/2019 1115   CREATININE 1.23 (H) 04/03/2019 1115      Component Value Date/Time   CALCIUM 8.2 (L) 04/03/2019 1115   ALKPHOS 88 04/03/2019 1115   AST 16 04/03/2019 1115   ALT 27 04/03/2019 1115   BILITOT 0.4 04/03/2019 1115       RADIOGRAPHIC STUDIES: Ir Imaging Guided Port Insertion  Result Date: 03/07/2019 INDICATION: History of lung cancer, in need of durable intravenous access for chemotherapy administration. EXAM: IMPLANTED PORT A CATH PLACEMENT WITH ULTRASOUND AND FLUOROSCOPIC GUIDANCE COMPARISON:  PET CT-01/19/2019 MEDICATIONS: Ancef 2 gm IV; The antibiotic was administered within an appropriate time interval prior to skin puncture. ANESTHESIA/SEDATION: Moderate (conscious) sedation was employed during this procedure. A total of Versed 4 mg and Fentanyl 100 mcg was administered intravenously. Moderate Sedation Time: 28 minutes. The patient's level of  consciousness and vital signs were monitored continuously by radiology nursing throughout the procedure under my direct supervision. CONTRAST:  None FLUOROSCOPY TIME:  30 seconds (14 mGy) COMPLICATIONS: None immediate. PROCEDURE: The procedure, risks, benefits, and alternatives were explained to the patient. Questions regarding the procedure were encouraged and answered. The patient understands and consents to the procedure. The right neck and chest were prepped with chlorhexidine in a sterile fashion, and a sterile drape was applied covering the operative field. Maximum barrier sterile technique with sterile gowns and gloves were used for the procedure. A timeout was performed prior to the initiation of the procedure. Local anesthesia was provided with 1% lidocaine with epinephrine. After creating a small venotomy incision, a micropuncture kit was utilized to access the internal jugular vein. Real-time ultrasound guidance was utilized for vascular access including the acquisition of a permanent ultrasound image documenting patency of the accessed vessel. The microwire was utilized to measure appropriate catheter length. A subcutaneous port pocket was then created along the upper chest wall utilizing a combination of sharp and blunt dissection. The pocket was irrigated with sterile saline. A single lumen ISP power injectable port was chosen for placement. The 8 Fr catheter was tunneled from the port pocket site to the venotomy incision. The port was placed in the pocket. The external catheter was trimmed to appropriate length. At the venotomy, an 8 Fr peel-away sheath was placed over a guidewire under fluoroscopic guidance. The catheter was then placed through the sheath and the sheath was removed. Final catheter positioning was confirmed and documented with a fluoroscopic spot radiograph. The port was accessed with a Huber needle, aspirated and flushed with heparinized saline. The venotomy site was closed with an  interrupted 4-0 Vicryl suture. The port pocket incision was closed with interrupted 2-0 Vicryl suture and the skin was opposed with a running subcuticular 4-0 Vicryl suture. Dermabond and Steri-strips were applied to both incisions. Dressings were placed. The patient tolerated the procedure well without immediate post  procedural complication. FINDINGS: After catheter placement, the tip lies within the superior cavoatrial junction. The catheter aspirates and flushes normally and is ready for immediate use. IMPRESSION: Successful placement of a right internal jugular approach power injectable Port-A-Cath. The catheter is ready for immediate use. Electronically Signed   By: Sandi Mariscal M.D.   On: 03/07/2019 14:09    ASSESSMENT AND PLAN: This is a very pleasant 63 years old white female with currently unresectable stage IIIb non-small cell lung cancer, squamous cell carcinoma. The patient was supposed to start a course of concurrent chemoradiation with weekly carboplatin and paclitaxel.  Radiotherapy was delayed but started last week in [facility] rather than [place] because of the significant delay. She tolerated the first week of her radiation well. I recommended for her to proceed with her concurrent chemoradiation as planned and I will add a few more rounds of chemotherapy to be concurrent with radiation. She had a Port-A-Cath placed last week for IV access. I will see the patient back for follow-up visit in 2 weeks for evaluation and management of any adverse effect of her treatment. She was advised to call immediately if she has any concerning symptoms in the interval. The patient voices understanding of current disease status and treatment options and is in agreement with the current care plan.  All questions were answered. The patient knows to call the clinic with any problems, questions or concerns. We can certainly see the patient much sooner if necessary.  I spent 10  minutes counseling the patient face to face. The total time spent in the appointment was 15 minutes.  Disclaimer: This note was dictated with voice recognition software. Similar sounding words can inadvertently be transcribed and may not be corrected upon review.

## 2019-04-07 ENCOUNTER — Other Ambulatory Visit: Payer: Self-pay | Admitting: Medical Oncology

## 2019-04-07 ENCOUNTER — Other Ambulatory Visit: Payer: Self-pay | Admitting: Internal Medicine

## 2019-04-07 DIAGNOSIS — C3492 Malignant neoplasm of unspecified part of left bronchus or lung: Secondary | ICD-10-CM

## 2019-04-10 ENCOUNTER — Inpatient Hospital Stay: Payer: Medicare Other

## 2019-04-10 ENCOUNTER — Other Ambulatory Visit: Payer: Self-pay

## 2019-04-10 ENCOUNTER — Other Ambulatory Visit: Payer: Medicare Other

## 2019-04-10 VITALS — BP 116/65 | HR 77 | Temp 98.5°F | Resp 20

## 2019-04-10 DIAGNOSIS — C3492 Malignant neoplasm of unspecified part of left bronchus or lung: Secondary | ICD-10-CM

## 2019-04-10 DIAGNOSIS — Z95828 Presence of other vascular implants and grafts: Secondary | ICD-10-CM

## 2019-04-10 DIAGNOSIS — Z5111 Encounter for antineoplastic chemotherapy: Secondary | ICD-10-CM | POA: Diagnosis not present

## 2019-04-10 LAB — CMP (CANCER CENTER ONLY)
ALT: 31 U/L (ref 0–44)
AST: 20 U/L (ref 15–41)
Albumin: 3.2 g/dL — ABNORMAL LOW (ref 3.5–5.0)
Alkaline Phosphatase: 92 U/L (ref 38–126)
Anion gap: 10 (ref 5–15)
BUN: 12 mg/dL (ref 8–23)
CO2: 28 mmol/L (ref 22–32)
Calcium: 8.6 mg/dL — ABNORMAL LOW (ref 8.9–10.3)
Chloride: 104 mmol/L (ref 98–111)
Creatinine: 1.03 mg/dL — ABNORMAL HIGH (ref 0.44–1.00)
GFR, Est AFR Am: >60 mL/min (ref >60)
GFR, Estimated: 57 mL/min — ABNORMAL LOW (ref >60)
Glucose, Bld: 114 mg/dL — ABNORMAL HIGH (ref 70–99)
Potassium: 3.8 mmol/L (ref 3.5–5.1)
Sodium: 142 mmol/L (ref 135–145)
Total Bilirubin: 0.3 mg/dL (ref 0.3–1.2)
Total Protein: 6.6 g/dL (ref 6.5–8.1)

## 2019-04-10 LAB — CBC WITH DIFFERENTIAL (CANCER CENTER ONLY)
Abs Immature Granulocytes: 0.02 10*3/uL (ref 0.00–0.07)
Basophils Absolute: 0 10*3/uL (ref 0.0–0.1)
Basophils Relative: 0 %
Eosinophils Absolute: 0 10*3/uL (ref 0.0–0.5)
Eosinophils Relative: 1 %
HCT: 33.1 % — ABNORMAL LOW (ref 36.0–46.0)
Hemoglobin: 11.1 g/dL — ABNORMAL LOW (ref 12.0–15.0)
Immature Granulocytes: 1 %
Lymphocytes Relative: 48 %
Lymphs Abs: 1.9 10*3/uL (ref 0.7–4.0)
MCH: 32.9 pg (ref 26.0–34.0)
MCHC: 33.5 g/dL (ref 30.0–36.0)
MCV: 98.2 fL (ref 80.0–100.0)
Monocytes Absolute: 0.2 10*3/uL (ref 0.1–1.0)
Monocytes Relative: 6 %
Neutro Abs: 1.7 10*3/uL (ref 1.7–7.7)
Neutrophils Relative %: 44 %
Platelet Count: 198 10*3/uL (ref 150–400)
RBC: 3.37 MIL/uL — ABNORMAL LOW (ref 3.87–5.11)
RDW: 14.7 % (ref 11.5–15.5)
WBC Count: 3.9 10*3/uL — ABNORMAL LOW (ref 4.0–10.5)
nRBC: 0 % (ref 0.0–0.2)

## 2019-04-10 MED ORDER — SODIUM CHLORIDE 0.9 % IV SOLN
20.0000 mg | Freq: Once | INTRAVENOUS | Status: AC
  Administered 2019-04-10: 20 mg via INTRAVENOUS
  Filled 2019-04-10: qty 20

## 2019-04-10 MED ORDER — PALONOSETRON HCL INJECTION 0.25 MG/5ML
INTRAVENOUS | Status: AC
  Filled 2019-04-10: qty 5

## 2019-04-10 MED ORDER — SODIUM CHLORIDE 0.9 % IV SOLN
45.0000 mg/m2 | Freq: Once | INTRAVENOUS | Status: AC
  Administered 2019-04-10: 90 mg via INTRAVENOUS
  Filled 2019-04-10: qty 15

## 2019-04-10 MED ORDER — SODIUM CHLORIDE 0.9% FLUSH
10.0000 mL | INTRAVENOUS | Status: DC | PRN
  Administered 2019-04-10: 10 mL
  Filled 2019-04-10: qty 10

## 2019-04-10 MED ORDER — HEPARIN SOD (PORK) LOCK FLUSH 100 UNIT/ML IV SOLN
500.0000 [IU] | Freq: Once | INTRAVENOUS | Status: AC | PRN
  Administered 2019-04-10: 500 [IU]
  Filled 2019-04-10: qty 5

## 2019-04-10 MED ORDER — SODIUM CHLORIDE 0.9 % IV SOLN
Freq: Once | INTRAVENOUS | Status: AC
  Administered 2019-04-10: 14:00:00 via INTRAVENOUS
  Filled 2019-04-10: qty 250

## 2019-04-10 MED ORDER — SODIUM CHLORIDE 0.9 % IV SOLN
190.0000 mg | Freq: Once | INTRAVENOUS | Status: AC
  Administered 2019-04-10: 190 mg via INTRAVENOUS
  Filled 2019-04-10: qty 19

## 2019-04-10 MED ORDER — FAMOTIDINE IN NACL 20-0.9 MG/50ML-% IV SOLN
INTRAVENOUS | Status: AC
  Filled 2019-04-10: qty 50

## 2019-04-10 MED ORDER — DIPHENHYDRAMINE HCL 50 MG/ML IJ SOLN
INTRAMUSCULAR | Status: AC
  Filled 2019-04-10: qty 1

## 2019-04-10 MED ORDER — FAMOTIDINE IN NACL 20-0.9 MG/50ML-% IV SOLN
20.0000 mg | Freq: Once | INTRAVENOUS | Status: AC
  Administered 2019-04-10: 20 mg via INTRAVENOUS

## 2019-04-10 MED ORDER — DIPHENHYDRAMINE HCL 50 MG/ML IJ SOLN
50.0000 mg | Freq: Once | INTRAMUSCULAR | Status: AC
  Administered 2019-04-10: 50 mg via INTRAVENOUS

## 2019-04-10 MED ORDER — PALONOSETRON HCL INJECTION 0.25 MG/5ML
0.2500 mg | Freq: Once | INTRAVENOUS | Status: AC
  Administered 2019-04-10: 0.25 mg via INTRAVENOUS

## 2019-04-10 NOTE — Patient Instructions (Signed)
[facility] Discharge Instructions for Patients Receiving Chemotherapy  Today you received the following chemotherapy agents:  Taxol, Carboplatin  To help prevent nausea and vomiting after your treatment, we encourage you to take your nausea medication as prescribed.   If you develop nausea and vomiting that is not controlled by your nausea medication, call the clinic.   BELOW ARE SYMPTOMS THAT SHOULD BE REPORTED IMMEDIATELY:  *FEVER GREATER THAN 100.5 F  *CHILLS WITH OR WITHOUT FEVER  NAUSEA AND VOMITING THAT IS NOT CONTROLLED WITH YOUR NAUSEA MEDICATION  *UNUSUAL SHORTNESS OF BREATH  *UNUSUAL BRUISING OR BLEEDING  TENDERNESS IN MOUTH AND THROAT WITH OR WITHOUT PRESENCE OF ULCERS  *URINARY PROBLEMS  *BOWEL PROBLEMS  UNUSUAL RASH Items with * indicate a potential emergency and should be followed up as soon as possible.  Feel free to call the clinic should you have any questions or concerns. The clinic phone number is [phone].  Please show the [place] at check-in to the Emergency Department and triage nurse.   Coronavirus (COVID-19) Are you at risk?  Are you at risk for the Coronavirus (COVID-19)?  To be considered HIGH RISK for Coronavirus (COVID-19), you have to meet the following criteria:  . Traveled to [place], [place], [place], [place] or [place]; or in the [place] to [place], [place], [place], or [place]; and have fever, cough, and shortness of breath within the last 2 weeks of travel OR . Been in close contact with a person diagnosed with COVID-19 within the last 2 weeks and have fever, cough, and shortness of breath . IF YOU DO NOT MEET THESE CRITERIA, YOU ARE CONSIDERED LOW RISK FOR COVID-19.  What to do if you are HIGH RISK for COVID-19?  Marland Kitchen If you are having a medical emergency, call 911. . Seek medical care right away. Before you go to a doctor's office, urgent care or emergency department, call ahead and tell them  about your recent travel, contact with someone diagnosed with COVID-19, and your symptoms. You should receive instructions from your physician's office regarding next steps of care.  . When you arrive at healthcare provider, tell the healthcare staff immediately you have returned from visiting [place], [place], [place], [place] or [place]; or traveled in the [place] to [place], [place], [place], or [place]; in the last two weeks or you have been in close contact with a person diagnosed with COVID-19 in the last 2 weeks.   . Tell the health care staff about your symptoms: fever, cough and shortness of breath. . After you have been seen by a medical provider, you will be either: o Tested for (COVID-19) and discharged home on quarantine except to seek medical care if symptoms worsen, and asked to  - Stay home and avoid contact with others until you get your results (4-5 days)  - Avoid travel on public transportation if possible (such as bus, train, or airplane) or o Sent to the Emergency Department by EMS for evaluation, COVID-19 testing, and possible admission depending on your condition and test results.  What to do if you are LOW RISK for COVID-19?  Reduce your risk of any infection by using the same precautions used for avoiding the common cold or flu:  Marland Kitchen Wash your hands often with soap and warm water for at least 20 seconds.  If soap and water are not readily available, use an alcohol-based hand sanitizer with at least 60% alcohol.  . If  coughing or sneezing, cover your mouth and nose by coughing or sneezing into the elbow areas of your shirt or coat, into a tissue or into your sleeve (not your hands). . Avoid shaking hands with others and consider head nods or verbal greetings only. . Avoid touching your eyes, nose, or mouth with unwashed hands.  . Avoid close contact with people who are sick. . Avoid places or events with large numbers of people in one location, like concerts or  sporting events. . Carefully consider travel plans you have or are making. . If you are planning any travel outside or inside the [place], visit the CDC's Travelers' Health webpage for the latest health notices. . If you have some symptoms but not all symptoms, continue to monitor at home and seek medical attention if your symptoms worsen. . If you are having a medical emergency, call 911.   [place] / e-Visit: [email or web address]         [facility]: [facility]: [phone]                   [facility]: [phone]

## 2019-04-17 ENCOUNTER — Other Ambulatory Visit: Payer: Self-pay | Admitting: *Deleted

## 2019-04-17 DIAGNOSIS — C3492 Malignant neoplasm of unspecified part of left bronchus or lung: Secondary | ICD-10-CM

## 2019-04-17 DIAGNOSIS — Z5111 Encounter for antineoplastic chemotherapy: Secondary | ICD-10-CM

## 2019-04-18 ENCOUNTER — Inpatient Hospital Stay (HOSPITAL_BASED_OUTPATIENT_CLINIC_OR_DEPARTMENT_OTHER): Payer: Medicare Other | Admitting: Physician Assistant

## 2019-04-18 ENCOUNTER — Other Ambulatory Visit: Payer: Medicare Other

## 2019-04-18 ENCOUNTER — Inpatient Hospital Stay: Payer: Medicare Other

## 2019-04-18 ENCOUNTER — Other Ambulatory Visit: Payer: Self-pay

## 2019-04-18 VITALS — HR 90

## 2019-04-18 VITALS — BP 150/64 | HR 109 | Temp 98.0°F | Resp 18 | Ht 62.0 in | Wt 211.0 lb

## 2019-04-18 DIAGNOSIS — C3492 Malignant neoplasm of unspecified part of left bronchus or lung: Secondary | ICD-10-CM

## 2019-04-18 DIAGNOSIS — Z5111 Encounter for antineoplastic chemotherapy: Secondary | ICD-10-CM

## 2019-04-18 DIAGNOSIS — R5383 Other fatigue: Secondary | ICD-10-CM

## 2019-04-18 DIAGNOSIS — C3411 Malignant neoplasm of upper lobe, right bronchus or lung: Secondary | ICD-10-CM | POA: Diagnosis not present

## 2019-04-18 DIAGNOSIS — R110 Nausea: Secondary | ICD-10-CM

## 2019-04-18 DIAGNOSIS — Z95828 Presence of other vascular implants and grafts: Secondary | ICD-10-CM

## 2019-04-18 LAB — CMP (CANCER CENTER ONLY)
ALT: 32 U/L (ref 0–44)
AST: 21 U/L (ref 15–41)
Albumin: 3.3 g/dL — ABNORMAL LOW (ref 3.5–5.0)
Alkaline Phosphatase: 95 U/L (ref 38–126)
Anion gap: 10 (ref 5–15)
BUN: 8 mg/dL (ref 8–23)
CO2: 25 mmol/L (ref 22–32)
Calcium: 8.4 mg/dL — ABNORMAL LOW (ref 8.9–10.3)
Chloride: 105 mmol/L (ref 98–111)
Creatinine: 1.01 mg/dL — ABNORMAL HIGH (ref 0.44–1.00)
GFR, Est AFR Am: >60 mL/min (ref >60)
GFR, Estimated: 58 mL/min — ABNORMAL LOW (ref >60)
Glucose, Bld: 157 mg/dL — ABNORMAL HIGH (ref 70–99)
Potassium: 4 mmol/L (ref 3.5–5.1)
Sodium: 140 mmol/L (ref 135–145)
Total Bilirubin: 0.4 mg/dL (ref 0.3–1.2)
Total Protein: 6.5 g/dL (ref 6.5–8.1)

## 2019-04-18 LAB — CBC WITH DIFFERENTIAL (CANCER CENTER ONLY)
Abs Immature Granulocytes: 0.03 10*3/uL (ref 0.00–0.07)
Basophils Absolute: 0 10*3/uL (ref 0.0–0.1)
Basophils Relative: 0 %
Eosinophils Absolute: 0 10*3/uL (ref 0.0–0.5)
Eosinophils Relative: 0 %
HCT: 31.1 % — ABNORMAL LOW (ref 36.0–46.0)
Hemoglobin: 10.5 g/dL — ABNORMAL LOW (ref 12.0–15.0)
Immature Granulocytes: 1 %
Lymphocytes Relative: 52 %
Lymphs Abs: 1.5 10*3/uL (ref 0.7–4.0)
MCH: 33 pg (ref 26.0–34.0)
MCHC: 33.8 g/dL (ref 30.0–36.0)
MCV: 97.8 fL (ref 80.0–100.0)
Monocytes Absolute: 0.4 10*3/uL (ref 0.1–1.0)
Monocytes Relative: 14 %
Neutro Abs: 0.9 10*3/uL — ABNORMAL LOW (ref 1.7–7.7)
Neutrophils Relative %: 33 %
Platelet Count: 257 10*3/uL (ref 150–400)
RBC: 3.18 MIL/uL — ABNORMAL LOW (ref 3.87–5.11)
RDW: 15.9 % — ABNORMAL HIGH (ref 11.5–15.5)
WBC Count: 2.9 10*3/uL — ABNORMAL LOW (ref 4.0–10.5)
nRBC: 0.7 % — ABNORMAL HIGH (ref 0.0–0.2)

## 2019-04-18 MED ORDER — FAMOTIDINE IN NACL 20-0.9 MG/50ML-% IV SOLN
20.0000 mg | Freq: Once | INTRAVENOUS | Status: AC
  Administered 2019-04-18: 20 mg via INTRAVENOUS

## 2019-04-18 MED ORDER — HEPARIN SOD (PORK) LOCK FLUSH 100 UNIT/ML IV SOLN
500.0000 [IU] | Freq: Once | INTRAVENOUS | Status: AC | PRN
  Administered 2019-04-18: 500 [IU]
  Filled 2019-04-18: qty 5

## 2019-04-18 MED ORDER — FAMOTIDINE IN NACL 20-0.9 MG/50ML-% IV SOLN
INTRAVENOUS | Status: AC
  Filled 2019-04-18: qty 50

## 2019-04-18 MED ORDER — SODIUM CHLORIDE 0.9 % IV SOLN
20.0000 mg | Freq: Once | INTRAVENOUS | Status: AC
  Administered 2019-04-18: 20 mg via INTRAVENOUS
  Filled 2019-04-18: qty 2

## 2019-04-18 MED ORDER — DIPHENHYDRAMINE HCL 50 MG/ML IJ SOLN
50.0000 mg | Freq: Once | INTRAMUSCULAR | Status: AC
  Administered 2019-04-18: 50 mg via INTRAVENOUS

## 2019-04-18 MED ORDER — SODIUM CHLORIDE 0.9% FLUSH
10.0000 mL | INTRAVENOUS | Status: DC | PRN
  Administered 2019-04-18: 10 mL
  Filled 2019-04-18: qty 10

## 2019-04-18 MED ORDER — SODIUM CHLORIDE 0.9 % IV SOLN
45.0000 mg/m2 | Freq: Once | INTRAVENOUS | Status: AC
  Administered 2019-04-18: 90 mg via INTRAVENOUS
  Filled 2019-04-18: qty 15

## 2019-04-18 MED ORDER — DIPHENHYDRAMINE HCL 50 MG/ML IJ SOLN
INTRAMUSCULAR | Status: AC
  Filled 2019-04-18: qty 1

## 2019-04-18 MED ORDER — PALONOSETRON HCL INJECTION 0.25 MG/5ML
0.2500 mg | Freq: Once | INTRAVENOUS | Status: AC
  Administered 2019-04-18: 0.25 mg via INTRAVENOUS

## 2019-04-18 MED ORDER — PALONOSETRON HCL INJECTION 0.25 MG/5ML
INTRAVENOUS | Status: AC
  Filled 2019-04-18: qty 5

## 2019-04-18 MED ORDER — SODIUM CHLORIDE 0.9 % IV SOLN
185.4000 mg | Freq: Once | INTRAVENOUS | Status: AC
  Administered 2019-04-18: 190 mg via INTRAVENOUS
  Filled 2019-04-18: qty 19

## 2019-04-18 MED ORDER — SODIUM CHLORIDE 0.9 % IV SOLN
Freq: Once | INTRAVENOUS | Status: AC
  Administered 2019-04-18: 12:00:00 via INTRAVENOUS
  Filled 2019-04-18: qty 250

## 2019-04-18 NOTE — Progress Notes (Signed)
Received OK to treat from [place], [place] with ANC 0.9.

## 2019-04-18 NOTE — Progress Notes (Signed)
Tonya Shelton OFFICE PROGRESS NOTE  Tonya Pain, FNP [place] [id number]  DIAGNOSIS: Stage IIb (T2b, N1, M0) non-small cell lung cancer, squamous cell carcinoma presented with right upper lobe lung mass in addition to left hilar adenopathy. There was also a suspicious groundglass opacity bilaterally. Diagnosed in April 2020.   PRIOR THERAPY: None  CURRENT THERAPY: Concurrent chemoradiation with weekly carboplatin for AUC of 4 and paclitaxel 45 mg/M2. First dose 02/27/2019. Status post 7 cycles.  She is receiving her radiation in [facility].  INTERVAL HISTORY: Tonya Shelton 63 y.o. female returns to the clinic for a follow up visit. The patient is feeling well today without any concerning complaints except for fatigue. She is tolerating her chemotherapy well without any adverse effects. She denies any fever, chills, night sweats, or weight loss. She denies any chest Shelton, shortness of breath, cough, or hemoptysis. She states she has had mild nausea which has not required the use of her anti-emetic. She denies vomiting, diarrhea, or constipation. She denies any headache or visual changes. She is receiving radiation in [place], [place]. Her last treatment is scheduled for May 08, 2019. She is here today for evaluation before starting cycle #8.     MEDICAL HISTORY: Past Medical History:  Diagnosis Date  . Acid reflux   . Anemia    low iron  . Arthritis   . Chronic kidney disease    Stage 3 - Dr. Iona Shelton in [place], [place]  . Depression   . Diabetes mellitus without complication ([place])    type 2  . Family history of adverse reaction to anesthesia    mom and sister have n/v  . H/O bladder problems    leaks  . History of hiatal hernia   . Hypertension   . Hypothyroidism   . Neuropathy   . Osteoporosis   . PVC's (premature ventricular contractions)   . Restless legs   . Sleep apnea    uses cpap with oxygen  . Stroke [facility]) 2007   TIA     ALLERGIES:  is allergic to erythromycin and povidone iodine.  MEDICATIONS:  Current Outpatient Medications  Medication Sig Dispense Refill  . acetaminophen (TYLENOL) 500 MG tablet Take 1,000-1,500 mg by mouth daily as needed for moderate Shelton.     Marland Kitchen alendronate (FOSAMAX) 70 MG tablet Take 70 mg by mouth every Sunday.     Marland Kitchen aspirin EC 81 MG tablet Take 81 mg by mouth daily at 12 noon.    Marland Kitchen atorvastatin (LIPITOR) 40 MG tablet Take 40 mg by mouth at bedtime.     . carvedilol (COREG) 25 MG tablet Take 25 mg by mouth 2 (two) times daily with a meal.    . cevimeline (EVOXAC) 30 MG capsule Take 30 mg by mouth 2 (two) times daily.     . DULoxetine (CYMBALTA) 60 MG capsule Take 60 mg by mouth daily.    . ferrous sulfate 325 (65 FE) MG tablet Take 325 mg by mouth daily.   0  . furosemide (LASIX) 20 MG tablet Take 20 mg by mouth daily.  0  . gabapentin (NEURONTIN) 100 MG capsule Take 200 mg by mouth at bedtime.     Marland Kitchen glimepiride (AMARYL) 2 MG tablet Take 2 mg by mouth daily with breakfast.   0  . levothyroxine (SYNTHROID, LEVOTHROID) 88 MCG tablet Take 88 mcg by mouth daily before breakfast.    . lidocaine (XYLOCAINE) 2 % solution Use as  directed 5 mLs in the mouth or throat every 3 (three) hours as needed for mouth Shelton. Swish and swallow or spit 200 mL 2  . magic mouthwash SOLN Take 5 mLs by mouth 4 (four) times daily as needed for mouth Shelton. 240 mL 3  . Magnesium 500 MG TABS Take 500 mg by mouth daily.    . Misc Natural Products (OSTEO BI-FLEX ADV TRIPLE ST PO) Take 1 tablet by mouth at bedtime.     . Multiple Vitamins-Minerals (ALIVE WOMENS 50+ PO) Take 1 tablet by mouth daily.    . nitroGLYCERIN (NITROSTAT) 0.4 MG SL tablet Place 0.4 mg under the tongue every 5 (five) minutes as needed for chest Shelton.    . Omega-3 Fatty Acids (FISH OIL PO) Take 2,000 mg by mouth 2 (two) times daily.    Marland Kitchen omeprazole (PRILOSEC) 20 MG capsule Take 40 mg by mouth daily.     Marland Kitchen OVER THE COUNTER MEDICATION Place  1-2 tablets under the tongue at bedtime. Restful legs otc    . oxybutynin (DITROPAN-XL) 10 MG 24 hr tablet Take 10 mg by mouth daily.     . prochlorperazine (COMPAZINE) 10 MG tablet TAKE 1 TABLET(10 MG) BY MOUTH EVERY 6 HOURS AS NEEDED FOR NAUSEA OR VOMITING 30 tablet 0  . sucralfate (CARAFATE) 1 g tablet Take 1 tablet (1 g total) by mouth 4 (four) times daily -  with meals and at bedtime. Dissolve in 1 ounce of water. Take 1 hour before or 2 hours after taking medications 120 tablet 2  . telmisartan (MICARDIS) 80 MG tablet Take 40 mg by mouth daily at 12 noon.     Marland Kitchen tiZANidine (ZANAFLEX) 4 MG tablet Take 8 mg by mouth at bedtime.     . TRADJENTA 5 MG TABS tablet Take 5 mg by mouth daily.  0  . UNABLE TO FIND Med Name: CPAP with 2lpm o2 bled in  [place]    . Vitamin D, Ergocalciferol, (DRISDOL) 50000 units CAPS capsule Take 50,000 Units by mouth every Sunday.      No current facility-administered medications for this visit.    Facility-Administered Medications Ordered in Other Visits  Medication Dose Route Frequency Provider Last Rate Last Dose  . CARBOplatin (PARAPLATIN) 190 mg in sodium chloride 0.9 % 250 mL chemo infusion  190 mg Intravenous Once Curt Bears, MD      . heparin lock flush 100 unit/mL  500 Units Intracatheter Once PRN Curt Bears, MD      . PACLitaxel (TAXOL) 90 mg in sodium chloride 0.9 % 250 mL chemo infusion (</= 80mg /m2)  45 mg/m2 (Treatment Plan Recorded) Intravenous Once Curt Bears, MD      . sodium chloride flush (NS) 0.9 % injection 10 mL  10 mL Intracatheter PRN Curt Bears, MD        SURGICAL HISTORY:  Past Surgical History:  Procedure Laterality Date  . BUNIONECTOMY    . GALLBLADDER SURGERY    . IR IMAGING GUIDED PORT INSERTION  03/07/2019  . LUMBAR LAMINECTOMY/DECOMPRESSION MICRODISCECTOMY N/A 10/15/2017   Procedure: LEFT L5-S1 MICRODISCECTOMY;  Surgeon: [name], MD;  Location: [facility];  Service: Orthopedics;  Laterality: N/A;  .  NEUROMA SURGERY    . SPINAL FUSION  2003   cervical  . tonsillectomy    . VIDEO BRONCHOSCOPY WITH RADIAL ENDOBRONCHIAL ULTRASOUND N/A 02/09/2019   Procedure: VIDEO BRONCHOSCOPY WITH RADIAL ENDOBRONCHIAL ULTRASOUND;  Surgeon: [name], MD;  Location: [facility];  Service: Thoracic;  Laterality: N/A;    REVIEW OF SYSTEMS:   Review of Systems  Constitutional: Positive for fatigue. Negative for appetite change, chills, fever and unexpected weight change.  HENT:   Negative for mouth sores, nosebleeds, sore throat and trouble swallowing.   Eyes: Negative for eye problems and icterus.  Respiratory: Negative for cough, hemoptysis, shortness of breath and wheezing.   Cardiovascular: Negative for chest Shelton and leg swelling.  Gastrointestinal: Negative for abdominal Shelton, constipation, diarrhea, nausea and vomiting.  Genitourinary: Negative for bladder incontinence, difficulty urinating, dysuria, frequency and hematuria.   Musculoskeletal: Negative for back Shelton, gait problem, neck Shelton and neck stiffness.  Skin: Positive for itching. Negative for rash.  Neurological: Negative for dizziness, extremity weakness, gait problem, headaches, light-headedness and seizures.  Hematological: Negative for adenopathy. Does not bruise/bleed easily.  Psychiatric/Behavioral: Negative for confusion, depression and sleep disturbance. The patient is not nervous/anxious.     PHYSICAL EXAMINATION:  Blood pressure (!) 150/64, pulse (!) 109, temperature 98 F (36.7 C), temperature source Temporal, resp. rate 18, height 5\' 2"  (1.575 m), weight 211 lb (95.7 kg), SpO2 94 %.  ECOG PERFORMANCE STATUS: 1 - Symptomatic but completely ambulatory  Physical Exam  Constitutional: Oriented to person, place, and time and well-developed, well-nourished, and in no distress.  HENT:  Head: Normocephalic and atraumatic.  Mouth/Throat: Oropharynx is clear and moist. No oropharyngeal exudate.  Eyes: Conjunctivae are normal.  Right eye exhibits no discharge. Left eye exhibits no discharge. No scleral icterus.  Neck: Normal range of motion. Neck supple.  Cardiovascular: Normal rate, regular rhythm, normal heart sounds and intact distal pulses.   Pulmonary/Chest: Effort normal and breath sounds normal. No respiratory distress. No wheezes. No rales.  Abdominal: Soft. Bowel sounds are normal. Exhibits no distension and no mass. There is no tenderness.  Musculoskeletal: Normal range of motion. Exhibits no edema.  Lymphadenopathy:    No cervical adenopathy.  Neurological: Alert and oriented to person, place, and time. Exhibits normal muscle tone. Gait normal. Coordination normal.  Skin: Skin is warm and dry. No rash noted. Not diaphoretic. No erythema. No pallor.  Psychiatric: Mood, memory and judgment normal.  Vitals reviewed.  LABORATORY DATA: Lab Results  Component Value Date   WBC 2.9 (L) 04/18/2019   HGB 10.5 (L) 04/18/2019   HCT 31.1 (L) 04/18/2019   MCV 97.8 04/18/2019   PLT 257 04/18/2019      Chemistry      Component Value Date/Time   NA 140 04/18/2019 1011   K 4.0 04/18/2019 1011   CL 105 04/18/2019 1011   CO2 25 04/18/2019 1011   BUN 8 04/18/2019 1011   CREATININE 1.01 (H) 04/18/2019 1011      Component Value Date/Time   CALCIUM 8.4 (L) 04/18/2019 1011   ALKPHOS 95 04/18/2019 1011   AST 21 04/18/2019 1011   ALT 32 04/18/2019 1011   BILITOT 0.4 04/18/2019 1011       RADIOGRAPHIC STUDIES:  No results found.   ASSESSMENT/PLAN:  This is a very pleasant 63 year old caucasian female with unresectable stage IIB, squamous cell carcinoma. She presented with a right upper lobe lung mass in addition to left hilar adenopathy. There was also a suspicious ground glass opacity bilaterally. She was diagnosed in April 2020.   She is currently undergoing concurrent chemoradiation with carboplatin for an AUC of 2 and paclitaxel 45 mg/m2. She is status post 7 cycles. There was a delay in initiating  radiotherapy due to her radiation being in [place],  [place] instead of [place], [place]. Her last radiation treatment is scheduled for July 13th, 2020. She has been tolerating treatment well without any concerning adverse effects except for fatigue.   Labs were reviewed with the patient. The patient's [place] is 0.9 today. I discussed the patient's labs with Dr. Julien Nordmann today who recommended that the patient proceed with treatment as scheduled with close monitoring. We will recheck her labs in 1 week. In the meantime, I reviewed neutropenic precautions with the patient.   She will receive cycle #8 today as scheduled.   We will see her back for a follow up visit in 2 weeks for evaluation before starting cycle #10.   The patient was advised to call immediately if she has any concerning symptoms in the interval. The patient voices understanding of current disease status and treatment options and is in agreement with the current care plan. All questions were answered. The patient knows to call the clinic with any problems, questions or concerns. We can certainly see the patient much sooner if necessary  Orders Placed This Encounter  Procedures  . CMP ([place] only)    Standing Status:   Standing    Number of Occurrences:   3    Standing Expiration Date:   04/17/2020  . CBC with Differential (Cancer Center Only)    Standing Status:   Standing    Number of Occurrences:   3    Standing Expiration Date:   04/17/2020     Tobe Sos Kemo Spruce, PA-C 04/18/19

## 2019-04-18 NOTE — Progress Notes (Signed)
Ok to treat per [employer], PA with current [place] today 04/18/2019

## 2019-04-18 NOTE — Patient Instructions (Signed)
[facility] Discharge Instructions for Patients Receiving Chemotherapy  Today you received the following chemotherapy agents: Taxol and Carboplatin   To help prevent nausea and vomiting after your treatment, we encourage you to take your nausea medication as directed.    If you develop nausea and vomiting that is not controlled by your nausea medication, call the clinic.   BELOW ARE SYMPTOMS THAT SHOULD BE REPORTED IMMEDIATELY:  *FEVER GREATER THAN 100.5 F  *CHILLS WITH OR WITHOUT FEVER  NAUSEA AND VOMITING THAT IS NOT CONTROLLED WITH YOUR NAUSEA MEDICATION  *UNUSUAL SHORTNESS OF BREATH  *UNUSUAL BRUISING OR BLEEDING  TENDERNESS IN MOUTH AND THROAT WITH OR WITHOUT PRESENCE OF ULCERS  *URINARY PROBLEMS  *BOWEL PROBLEMS  UNUSUAL RASH Items with * indicate a potential emergency and should be followed up as soon as possible.  Feel free to call the clinic should you have any questions or concerns. The clinic phone number is [phone].  Please show the [place] at check-in to the Emergency Department and triage nurse.  Coronavirus (COVID-19) Are you at risk?  Are you at risk for the Coronavirus (COVID-19)?  To be considered HIGH RISK for Coronavirus (COVID-19), you have to meet the following criteria:  . Traveled to [place], [place], [place], [place] or [place]; or in the [place] to [employer], [place], [place], or [place]; and have fever, cough, and shortness of breath within the last 2 weeks of travel OR . Been in close contact with a person diagnosed with COVID-19 within the last 2 weeks and have fever, cough, and shortness of breath . IF YOU DO NOT MEET THESE CRITERIA, YOU ARE CONSIDERED LOW RISK FOR COVID-19.  What to do if you are HIGH RISK for COVID-19?  Marland Kitchen If you are having a medical emergency, call 911. . Seek medical care right away. Before you go to a doctor's office, urgent care or emergency department, call ahead and tell them  about your recent travel, contact with someone diagnosed with COVID-19, and your symptoms. You should receive instructions from your physician's office regarding next steps of care.  . When you arrive at healthcare provider, tell the healthcare staff immediately you have returned from visiting [place], [place], [place], [place] or [place]; or traveled in the [place] to [place], [place], [place], or [place]; in the last two weeks or you have been in close contact with a person diagnosed with COVID-19 in the last 2 weeks.   . Tell the health care staff about your symptoms: fever, cough and shortness of breath. . After you have been seen by a medical provider, you will be either: o Tested for (COVID-19) and discharged home on quarantine except to seek medical care if symptoms worsen, and asked to  - Stay home and avoid contact with others until you get your results (4-5 days)  - Avoid travel on public transportation if possible (such as bus, train, or airplane) or o Sent to the Emergency Department by EMS for evaluation, COVID-19 testing, and possible admission depending on your condition and test results.  What to do if you are LOW RISK for COVID-19?  Reduce your risk of any infection by using the same precautions used for avoiding the common cold or flu:  Marland Kitchen Wash your hands often with soap and warm water for at least 20 seconds.  If soap and water are not readily available, use an alcohol-based hand sanitizer with at least 60% alcohol.  Marland Kitchen  If coughing or sneezing, cover your mouth and nose by coughing or sneezing into the elbow areas of your shirt or coat, into a tissue or into your sleeve (not your hands). . Avoid shaking hands with others and consider head nods or verbal greetings only. . Avoid touching your eyes, nose, or mouth with unwashed hands.  . Avoid close contact with people who are sick. . Avoid places or events with large numbers of people in one location, like concerts or  sporting events. . Carefully consider travel plans you have or are making. . If you are planning any travel outside or inside the [place], visit the CDC's Travelers' Health webpage for the latest health notices. . If you have some symptoms but not all symptoms, continue to monitor at home and seek medical attention if your symptoms worsen. . If you are having a medical emergency, call 911.   [place] / e-Visit: [email or web address]         [facility]: [facility]: [phone]                   [facility]: [phone]

## 2019-04-24 ENCOUNTER — Inpatient Hospital Stay: Payer: Medicare Other

## 2019-04-24 ENCOUNTER — Other Ambulatory Visit: Payer: Medicare Other

## 2019-04-24 ENCOUNTER — Other Ambulatory Visit: Payer: Self-pay

## 2019-04-24 VITALS — BP 136/67 | HR 86 | Temp 98.9°F | Resp 18 | Ht 62.0 in | Wt 210.8 lb

## 2019-04-24 DIAGNOSIS — Z5111 Encounter for antineoplastic chemotherapy: Secondary | ICD-10-CM | POA: Diagnosis not present

## 2019-04-24 DIAGNOSIS — Z95828 Presence of other vascular implants and grafts: Secondary | ICD-10-CM

## 2019-04-24 DIAGNOSIS — C3492 Malignant neoplasm of unspecified part of left bronchus or lung: Secondary | ICD-10-CM

## 2019-04-24 LAB — CBC WITH DIFFERENTIAL (CANCER CENTER ONLY)
Abs Immature Granulocytes: 0.14 10*3/uL — ABNORMAL HIGH (ref 0.00–0.07)
Basophils Absolute: 0 10*3/uL (ref 0.0–0.1)
Basophils Relative: 1 %
Eosinophils Absolute: 0 10*3/uL (ref 0.0–0.5)
Eosinophils Relative: 0 %
HCT: 30.1 % — ABNORMAL LOW (ref 36.0–46.0)
Hemoglobin: 10.3 g/dL — ABNORMAL LOW (ref 12.0–15.0)
Immature Granulocytes: 4 %
Lymphocytes Relative: 42 %
Lymphs Abs: 1.5 10*3/uL (ref 0.7–4.0)
MCH: 33.7 pg (ref 26.0–34.0)
MCHC: 34.2 g/dL (ref 30.0–36.0)
MCV: 98.4 fL (ref 80.0–100.0)
Monocytes Absolute: 0.4 10*3/uL (ref 0.1–1.0)
Monocytes Relative: 11 %
Neutro Abs: 1.4 10*3/uL — ABNORMAL LOW (ref 1.7–7.7)
Neutrophils Relative %: 42 %
Platelet Count: 203 10*3/uL (ref 150–400)
RBC: 3.06 MIL/uL — ABNORMAL LOW (ref 3.87–5.11)
RDW: 16.5 % — ABNORMAL HIGH (ref 11.5–15.5)
WBC Count: 3.5 10*3/uL — ABNORMAL LOW (ref 4.0–10.5)
nRBC: 1.2 % — ABNORMAL HIGH (ref 0.0–0.2)

## 2019-04-24 LAB — CMP (CANCER CENTER ONLY)
ALT: 32 U/L (ref 0–44)
AST: 18 U/L (ref 15–41)
Albumin: 3.3 g/dL — ABNORMAL LOW (ref 3.5–5.0)
Alkaline Phosphatase: 94 U/L (ref 38–126)
Anion gap: 9 (ref 5–15)
BUN: 10 mg/dL (ref 8–23)
CO2: 26 mmol/L (ref 22–32)
Calcium: 8.8 mg/dL — ABNORMAL LOW (ref 8.9–10.3)
Chloride: 107 mmol/L (ref 98–111)
Creatinine: 1.06 mg/dL — ABNORMAL HIGH (ref 0.44–1.00)
GFR, Est AFR Am: >60 mL/min (ref >60)
GFR, Estimated: 55 mL/min — ABNORMAL LOW (ref >60)
Glucose, Bld: 98 mg/dL (ref 70–99)
Potassium: 3.9 mmol/L (ref 3.5–5.1)
Sodium: 142 mmol/L (ref 135–145)
Total Bilirubin: 0.3 mg/dL (ref 0.3–1.2)
Total Protein: 6.5 g/dL (ref 6.5–8.1)

## 2019-04-24 MED ORDER — SODIUM CHLORIDE 0.9% FLUSH
10.0000 mL | INTRAVENOUS | Status: DC | PRN
  Administered 2019-04-24: 10 mL
  Filled 2019-04-24: qty 10

## 2019-04-24 MED ORDER — DIPHENHYDRAMINE HCL 50 MG/ML IJ SOLN
INTRAMUSCULAR | Status: AC
  Filled 2019-04-24: qty 1

## 2019-04-24 MED ORDER — SODIUM CHLORIDE 0.9 % IV SOLN
20.0000 mg | Freq: Once | INTRAVENOUS | Status: AC
  Administered 2019-04-24: 20 mg via INTRAVENOUS
  Filled 2019-04-24: qty 20

## 2019-04-24 MED ORDER — FAMOTIDINE IN NACL 20-0.9 MG/50ML-% IV SOLN
INTRAVENOUS | Status: AC
  Filled 2019-04-24: qty 50

## 2019-04-24 MED ORDER — DIPHENHYDRAMINE HCL 50 MG/ML IJ SOLN
50.0000 mg | Freq: Once | INTRAMUSCULAR | Status: AC
  Administered 2019-04-24: 50 mg via INTRAVENOUS

## 2019-04-24 MED ORDER — PALONOSETRON HCL INJECTION 0.25 MG/5ML
0.2500 mg | Freq: Once | INTRAVENOUS | Status: AC
  Administered 2019-04-24: 0.25 mg via INTRAVENOUS

## 2019-04-24 MED ORDER — PALONOSETRON HCL INJECTION 0.25 MG/5ML
INTRAVENOUS | Status: AC
  Filled 2019-04-24: qty 5

## 2019-04-24 MED ORDER — SODIUM CHLORIDE 0.9 % IV SOLN
185.4000 mg | Freq: Once | INTRAVENOUS | Status: AC
  Administered 2019-04-24: 190 mg via INTRAVENOUS
  Filled 2019-04-24: qty 19

## 2019-04-24 MED ORDER — SODIUM CHLORIDE 0.9 % IV SOLN
Freq: Once | INTRAVENOUS | Status: AC
  Administered 2019-04-24: 15:00:00 via INTRAVENOUS
  Filled 2019-04-24: qty 250

## 2019-04-24 MED ORDER — HEPARIN SOD (PORK) LOCK FLUSH 100 UNIT/ML IV SOLN
500.0000 [IU] | Freq: Once | INTRAVENOUS | Status: AC | PRN
  Administered 2019-04-24: 500 [IU]
  Filled 2019-04-24: qty 5

## 2019-04-24 MED ORDER — FAMOTIDINE IN NACL 20-0.9 MG/50ML-% IV SOLN
20.0000 mg | Freq: Once | INTRAVENOUS | Status: AC
  Administered 2019-04-24: 20 mg via INTRAVENOUS

## 2019-04-24 MED ORDER — SODIUM CHLORIDE 0.9 % IV SOLN
45.0000 mg/m2 | Freq: Once | INTRAVENOUS | Status: AC
  Administered 2019-04-24: 90 mg via INTRAVENOUS
  Filled 2019-04-24: qty 15

## 2019-04-24 NOTE — Progress Notes (Signed)
Per Dr. Julien Nordmann, [place] to treat with ANC 1.4 today.

## 2019-04-24 NOTE — Patient Instructions (Signed)
[facility] Discharge Instructions for Patients Receiving Chemotherapy  Today you received the following chemotherapy agents:  Taxol, Carboplatin  To help prevent nausea and vomiting after your treatment, we encourage you to take your nausea medication as prescribed.   If you develop nausea and vomiting that is not controlled by your nausea medication, call the clinic.   BELOW ARE SYMPTOMS THAT SHOULD BE REPORTED IMMEDIATELY:  *FEVER GREATER THAN 100.5 F  *CHILLS WITH OR WITHOUT FEVER  NAUSEA AND VOMITING THAT IS NOT CONTROLLED WITH YOUR NAUSEA MEDICATION  *UNUSUAL SHORTNESS OF BREATH  *UNUSUAL BRUISING OR BLEEDING  TENDERNESS IN MOUTH AND THROAT WITH OR WITHOUT PRESENCE OF ULCERS  *URINARY PROBLEMS  *BOWEL PROBLEMS  UNUSUAL RASH Items with * indicate a potential emergency and should be followed up as soon as possible.  Feel free to call the clinic should you have any questions or concerns. The clinic phone number is [phone].  Please show the CHEMO ALERT CARD at check-in to the Emergency Department and triage nurse.   

## 2019-05-01 ENCOUNTER — Inpatient Hospital Stay: Payer: Medicare Other

## 2019-05-01 ENCOUNTER — Other Ambulatory Visit: Payer: Self-pay

## 2019-05-01 ENCOUNTER — Inpatient Hospital Stay: Payer: Medicare Other | Attending: Internal Medicine

## 2019-05-01 ENCOUNTER — Other Ambulatory Visit: Payer: Medicare Other

## 2019-05-01 ENCOUNTER — Inpatient Hospital Stay (HOSPITAL_BASED_OUTPATIENT_CLINIC_OR_DEPARTMENT_OTHER): Payer: Medicare Other | Admitting: Physician Assistant

## 2019-05-01 ENCOUNTER — Encounter: Payer: Self-pay | Admitting: Physician Assistant

## 2019-05-01 VITALS — BP 133/68 | HR 91 | Temp 98.3°F | Resp 18 | Ht 62.0 in | Wt 210.0 lb

## 2019-05-01 DIAGNOSIS — C3492 Malignant neoplasm of unspecified part of left bronchus or lung: Secondary | ICD-10-CM

## 2019-05-01 DIAGNOSIS — C3411 Malignant neoplasm of upper lobe, right bronchus or lung: Secondary | ICD-10-CM | POA: Insufficient documentation

## 2019-05-01 DIAGNOSIS — M542 Cervicalgia: Secondary | ICD-10-CM

## 2019-05-01 DIAGNOSIS — M810 Age-related osteoporosis without current pathological fracture: Secondary | ICD-10-CM | POA: Diagnosis not present

## 2019-05-01 DIAGNOSIS — K219 Gastro-esophageal reflux disease without esophagitis: Secondary | ICD-10-CM

## 2019-05-01 DIAGNOSIS — Z5111 Encounter for antineoplastic chemotherapy: Secondary | ICD-10-CM | POA: Insufficient documentation

## 2019-05-01 DIAGNOSIS — R4702 Dysphasia: Secondary | ICD-10-CM

## 2019-05-01 DIAGNOSIS — Z95828 Presence of other vascular implants and grafts: Secondary | ICD-10-CM

## 2019-05-01 LAB — CBC WITH DIFFERENTIAL (CANCER CENTER ONLY)
Abs Immature Granulocytes: 0.11 10*3/uL — ABNORMAL HIGH (ref 0.00–0.07)
Basophils Absolute: 0 10*3/uL (ref 0.0–0.1)
Basophils Relative: 1 %
Eosinophils Absolute: 0 10*3/uL (ref 0.0–0.5)
Eosinophils Relative: 0 %
HCT: 29.7 % — ABNORMAL LOW (ref 36.0–46.0)
Hemoglobin: 10.2 g/dL — ABNORMAL LOW (ref 12.0–15.0)
Immature Granulocytes: 2 %
Lymphocytes Relative: 27 %
Lymphs Abs: 1.3 10*3/uL (ref 0.7–4.0)
MCH: 34.1 pg — ABNORMAL HIGH (ref 26.0–34.0)
MCHC: 34.3 g/dL (ref 30.0–36.0)
MCV: 99.3 fL (ref 80.0–100.0)
Monocytes Absolute: 0.5 10*3/uL (ref 0.1–1.0)
Monocytes Relative: 9 %
Neutro Abs: 3.1 10*3/uL (ref 1.7–7.7)
Neutrophils Relative %: 61 %
Platelet Count: 162 10*3/uL (ref 150–400)
RBC: 2.99 MIL/uL — ABNORMAL LOW (ref 3.87–5.11)
RDW: 17.8 % — ABNORMAL HIGH (ref 11.5–15.5)
WBC Count: 5 10*3/uL (ref 4.0–10.5)
nRBC: 0 % (ref 0.0–0.2)

## 2019-05-01 LAB — CMP (CANCER CENTER ONLY)
ALT: 32 U/L (ref 0–44)
AST: 18 U/L (ref 15–41)
Albumin: 3.2 g/dL — ABNORMAL LOW (ref 3.5–5.0)
Alkaline Phosphatase: 84 U/L (ref 38–126)
Anion gap: 9 (ref 5–15)
BUN: 9 mg/dL (ref 8–23)
CO2: 25 mmol/L (ref 22–32)
Calcium: 7.7 mg/dL — ABNORMAL LOW (ref 8.9–10.3)
Chloride: 106 mmol/L (ref 98–111)
Creatinine: 0.98 mg/dL (ref 0.44–1.00)
GFR, Est AFR Am: >60 mL/min (ref >60)
GFR, Estimated: >60 mL/min (ref >60)
Glucose, Bld: 145 mg/dL — ABNORMAL HIGH (ref 70–99)
Potassium: 4.1 mmol/L (ref 3.5–5.1)
Sodium: 140 mmol/L (ref 135–145)
Total Bilirubin: 0.5 mg/dL (ref 0.3–1.2)
Total Protein: 6.4 g/dL — ABNORMAL LOW (ref 6.5–8.1)

## 2019-05-01 MED ORDER — PALONOSETRON HCL INJECTION 0.25 MG/5ML
INTRAVENOUS | Status: AC
  Filled 2019-05-01: qty 5

## 2019-05-01 MED ORDER — SODIUM CHLORIDE 0.9 % IV SOLN
45.0000 mg/m2 | Freq: Once | INTRAVENOUS | Status: AC
  Administered 2019-05-01: 90 mg via INTRAVENOUS
  Filled 2019-05-01: qty 15

## 2019-05-01 MED ORDER — SODIUM CHLORIDE 0.9 % IV SOLN
20.0000 mg | Freq: Once | INTRAVENOUS | Status: AC
  Administered 2019-05-01: 20 mg via INTRAVENOUS
  Filled 2019-05-01: qty 2

## 2019-05-01 MED ORDER — FAMOTIDINE IN NACL 20-0.9 MG/50ML-% IV SOLN
20.0000 mg | Freq: Once | INTRAVENOUS | Status: AC
  Administered 2019-05-01: 20 mg via INTRAVENOUS

## 2019-05-01 MED ORDER — SODIUM CHLORIDE 0.9% FLUSH
10.0000 mL | INTRAVENOUS | Status: DC | PRN
  Administered 2019-05-01: 10 mL
  Filled 2019-05-01: qty 10

## 2019-05-01 MED ORDER — HEPARIN SOD (PORK) LOCK FLUSH 100 UNIT/ML IV SOLN
500.0000 [IU] | Freq: Once | INTRAVENOUS | Status: AC | PRN
  Administered 2019-05-01: 500 [IU]
  Filled 2019-05-01: qty 5

## 2019-05-01 MED ORDER — FAMOTIDINE IN NACL 20-0.9 MG/50ML-% IV SOLN
INTRAVENOUS | Status: AC
  Filled 2019-05-01: qty 50

## 2019-05-01 MED ORDER — PALONOSETRON HCL INJECTION 0.25 MG/5ML
0.2500 mg | Freq: Once | INTRAVENOUS | Status: AC
  Administered 2019-05-01: 0.25 mg via INTRAVENOUS

## 2019-05-01 MED ORDER — DIPHENHYDRAMINE HCL 50 MG/ML IJ SOLN
50.0000 mg | Freq: Once | INTRAMUSCULAR | Status: AC
  Administered 2019-05-01: 50 mg via INTRAVENOUS

## 2019-05-01 MED ORDER — SODIUM CHLORIDE 0.9 % IV SOLN
185.4000 mg | Freq: Once | INTRAVENOUS | Status: AC
  Administered 2019-05-01: 190 mg via INTRAVENOUS
  Filled 2019-05-01: qty 19

## 2019-05-01 MED ORDER — SODIUM CHLORIDE 0.9 % IV SOLN
Freq: Once | INTRAVENOUS | Status: AC
  Administered 2019-05-01: 13:00:00 via INTRAVENOUS
  Filled 2019-05-01: qty 250

## 2019-05-01 MED ORDER — DIPHENHYDRAMINE HCL 50 MG/ML IJ SOLN
INTRAMUSCULAR | Status: AC
  Filled 2019-05-01: qty 1

## 2019-05-01 NOTE — Patient Instructions (Signed)
[facility] Discharge Instructions for Patients Receiving Chemotherapy  Today you received the following chemotherapy agents Taxol, Carboplatin  To help prevent nausea and vomiting after your treatment, we encourage you to take your nausea medication as directed by your MD.   If you develop nausea and vomiting that is not controlled by your nausea medication, call the clinic.   BELOW ARE SYMPTOMS THAT SHOULD BE REPORTED IMMEDIATELY:  *FEVER GREATER THAN 100.5 F  *CHILLS WITH OR WITHOUT FEVER  NAUSEA AND VOMITING THAT IS NOT CONTROLLED WITH YOUR NAUSEA MEDICATION  *UNUSUAL SHORTNESS OF BREATH  *UNUSUAL BRUISING OR BLEEDING  TENDERNESS IN MOUTH AND THROAT WITH OR WITHOUT PRESENCE OF ULCERS  *URINARY PROBLEMS  *BOWEL PROBLEMS  UNUSUAL RASH Items with * indicate a potential emergency and should be followed up as soon as possible.  Feel free to call the clinic should you have any questions or concerns. The clinic phone number is [phone].  Please show the [facility] at check-in to the Emergency Department and triage nurse.  Coronavirus (COVID-19) Are you at risk?  Are you at risk for the Coronavirus (COVID-19)?  To be considered HIGH RISK for Coronavirus (COVID-19), you have to meet the following criteria:  . Traveled to [place], [place], [place], [place] or [place]; or in the [place] to [place], [place], [place], or [place]; and have fever, cough, and shortness of breath within the last 2 weeks of travel OR . Been in close contact with a person diagnosed with COVID-19 within the last 2 weeks and have fever, cough, and shortness of breath . IF YOU DO NOT MEET THESE CRITERIA, YOU ARE CONSIDERED LOW RISK FOR COVID-19.  What to do if you are HIGH RISK for COVID-19?  Marland Kitchen If you are having a medical emergency, call 911. . Seek medical care right away. Before you go to a doctor's office, urgent care or emergency department, call ahead and tell  them about your recent travel, contact with someone diagnosed with COVID-19, and your symptoms. You should receive instructions from your physician's office regarding next steps of care.  . When you arrive at healthcare provider, tell the healthcare staff immediately you have returned from visiting [place], [place], [place], [place] or [place]; or traveled in the [place] to [place], [place], [place], or [place]; in the last two weeks or you have been in close contact with a person diagnosed with COVID-19 in the last 2 weeks.   . Tell the health care staff about your symptoms: fever, cough and shortness of breath. . After you have been seen by a medical provider, you will be either: o Tested for (COVID-19) and discharged home on quarantine except to seek medical care if symptoms worsen, and asked to  - Stay home and avoid contact with others until you get your results (4-5 days)  - Avoid travel on public transportation if possible (such as bus, train, or airplane) or o Sent to the Emergency Department by EMS for evaluation, COVID-19 testing, and possible admission depending on your condition and test results.  What to do if you are LOW RISK for COVID-19?  Reduce your risk of any infection by using the same precautions used for avoiding the common cold or flu:  Marland Kitchen Wash your hands often with soap and warm water for at least 20 seconds.  If soap and water are not readily available, use an alcohol-based hand sanitizer with at least 60% alcohol.  Marland Kitchen  If coughing or sneezing, cover your mouth and nose by coughing or sneezing into the elbow areas of your shirt or coat, into a tissue or into your sleeve (not your hands). . Avoid shaking hands with others and consider head nods or verbal greetings only. . Avoid touching your eyes, nose, or mouth with unwashed hands.  . Avoid close contact with people who are sick. . Avoid places or events with large numbers of people in one location, like concerts or  sporting events. . Carefully consider travel plans you have or are making. . If you are planning any travel outside or inside the [place], visit the CDC's Travelers' Health webpage for the latest health notices. . If you have some symptoms but not all symptoms, continue to monitor at home and seek medical attention if your symptoms worsen. . If you are having a medical emergency, call 911.   [place] / e-Visit: [email or web address]         [facility]: [facility]: [phone]                   [facility]: [phone]

## 2019-05-01 NOTE — Progress Notes (Signed)
[facility] OFFICE PROGRESS NOTE  [name], FNP [place] [address]  DIAGNOSIS: Stage IIb (T2b, N1, M0) non-small cell lung cancer, squamous cell carcinoma presented with right upper lobe lung mass in addition to left hilar adenopathy. There was also a suspicious groundglass opacity bilaterally. Diagnosed in April 2020.   PRIOR THERAPY: None  CURRENT THERAPY: Concurrent chemoradiation with weekly carboplatin for AUC of 4 and paclitaxel 45 mg/M2. First dose 02/27/2019. Status post 9cycles. She is receiving her radiation in [facility].  INTERVAL HISTORY: TRANICE Shelton 63 y.o. female returns to the clinic for a follow-up visit.  The patient is feeling fairly well today without any concerning complaints except for continued dysphasia secondary to her radiation treatment. She denies any associated pain. The patient is using Magic mouthwash and Carafate 3 times a day. She also uses Prilosec and pepcid for reflux. The patient has not lost any weight since her last treatment. The patient's last radiation treatment is scheduled for May 08, 2019.   She also mentioned a constant achy pain in her cervical spine with some radiation into her right neck/shoulder. She states this has been present for approximately 2 weeks and believes it to be getting worse. She has not tried any interventions to alleviate her pain. Her pain is exacerbated by movement of her neck. She denies any recent traumas or injuries. She has a history of a cervical fusion several years ago and disc degeneration. This was treated back in [place]. She also has a history of osteoporosis for which she takes vitamin D as well as Fosamax. She denies any associated symptoms such as numbness, tingling, or weakness in her upper extremities.    Otherwise the patient has been tolerating her treatment well without any adverse side effects.  She denies any fever, chills, or night sweats.  She  denies any chest pain, shortness of breath, cough, or hemoptysis.  She denies any nausea, vomiting, diarrhea, or constipation. She denies any headache or visual changes.  She is here today for evaluation before starting cycle #10.   MEDICAL HISTORY: Past Medical History:  Diagnosis Date  . Acid reflux   . Anemia    low iron  . Arthritis   . Chronic kidney disease    Stage 3 - Dr. Iona Beard in [place], [place]  . Depression   . Diabetes mellitus without complication ([place])    type 2  . Family history of adverse reaction to anesthesia    mom and sister have n/v  . H/O bladder problems    leaks  . History of hiatal hernia   . Hypertension   . Hypothyroidism   . Neuropathy   . Osteoporosis   . PVC's (premature ventricular contractions)   . Restless legs   . Sleep apnea    uses cpap with oxygen  . Stroke [facility]) 2007   TIA    ALLERGIES:  is allergic to erythromycin and povidone iodine.  MEDICATIONS:  Current Outpatient Medications  Medication Sig Dispense Refill  . acetaminophen (TYLENOL) 500 MG tablet Take 1,000-1,500 mg by mouth daily as needed for moderate pain.     Marland Kitchen alendronate (FOSAMAX) 70 MG tablet Take 70 mg by mouth every Sunday.     Marland Kitchen aspirin EC 81 MG tablet Take 81 mg by mouth daily at 12 noon.    Marland Kitchen atorvastatin (LIPITOR) 40 MG tablet Take 40 mg by mouth at bedtime.     . carvedilol (COREG) 25 MG  tablet Take 25 mg by mouth 2 (two) times daily with a meal.    . cevimeline (EVOXAC) 30 MG capsule Take 30 mg by mouth 2 (two) times daily.     . DULoxetine (CYMBALTA) 60 MG capsule Take 60 mg by mouth daily.    . ferrous sulfate 325 (65 FE) MG tablet Take 325 mg by mouth daily.   0  . gabapentin (NEURONTIN) 100 MG capsule Take 200 mg by mouth at bedtime.     Marland Kitchen glimepiride (AMARYL) 2 MG tablet Take 2 mg by mouth daily with breakfast.   0  . levothyroxine (SYNTHROID, LEVOTHROID) 88 MCG tablet Take 88 mcg by mouth daily before breakfast.    . lidocaine (XYLOCAINE) 2 % solution  Use as directed 5 mLs in the mouth or throat every 3 (three) hours as needed for mouth pain. Swish and swallow or spit 200 mL 2  . magic mouthwash SOLN Take 5 mLs by mouth 4 (four) times daily as needed for mouth pain. 240 mL 3  . Magnesium 500 MG TABS Take 500 mg by mouth daily.    . Misc Natural Products (OSTEO BI-FLEX ADV TRIPLE ST PO) Take 1 tablet by mouth at bedtime.     . Multiple Vitamins-Minerals (ALIVE WOMENS 50+ PO) Take 1 tablet by mouth daily.    . nitroGLYCERIN (NITROSTAT) 0.4 MG SL tablet Place 0.4 mg under the tongue every 5 (five) minutes as needed for chest pain.    . Omega-3 Fatty Acids (FISH OIL PO) Take 2,000 mg by mouth 2 (two) times daily.    Marland Kitchen omeprazole (PRILOSEC) 20 MG capsule Take 40 mg by mouth daily.     Marland Kitchen OVER THE COUNTER MEDICATION Place 1-2 tablets under the tongue at bedtime. Restful legs otc    . oxybutynin (DITROPAN-XL) 10 MG 24 hr tablet Take 10 mg by mouth daily.     . prochlorperazine (COMPAZINE) 10 MG tablet TAKE 1 TABLET(10 MG) BY MOUTH EVERY 6 HOURS AS NEEDED FOR NAUSEA OR VOMITING 30 tablet 0  . sucralfate (CARAFATE) 1 g tablet Take 1 tablet (1 g total) by mouth 4 (four) times daily -  with meals and at bedtime. Dissolve in 1 ounce of water. Take 1 hour before or 2 hours after taking medications 120 tablet 2  . telmisartan (MICARDIS) 80 MG tablet Take 40 mg by mouth daily at 12 noon.     Marland Kitchen tiZANidine (ZANAFLEX) 4 MG tablet Take 8 mg by mouth at bedtime.     . TRADJENTA 5 MG TABS tablet Take 5 mg by mouth daily.  0  . UNABLE TO FIND Med Name: CPAP with 2lpm o2 bled in  [place]    . Vitamin D, Ergocalciferol, (DRISDOL) 50000 units CAPS capsule Take 50,000 Units by mouth every Sunday.     . furosemide (LASIX) 20 MG tablet Take 20 mg by mouth daily.  0   No current facility-administered medications for this visit.     SURGICAL HISTORY:  Past Surgical History:  Procedure Laterality Date  . BUNIONECTOMY    . GALLBLADDER SURGERY    . IR IMAGING GUIDED  PORT INSERTION  03/07/2019  . LUMBAR LAMINECTOMY/DECOMPRESSION MICRODISCECTOMY N/A 10/15/2017   Procedure: LEFT L5-S1 MICRODISCECTOMY;  Surgeon: [name], MD;  Location: [place];  Service: Orthopedics;  Laterality: N/A;  . NEUROMA SURGERY    . SPINAL FUSION  2003   cervical  . tonsillectomy    . VIDEO BRONCHOSCOPY WITH RADIAL ENDOBRONCHIAL ULTRASOUND N/A  02/09/2019   Procedure: VIDEO BRONCHOSCOPY WITH RADIAL ENDOBRONCHIAL ULTRASOUND;  Surgeon: [name], MD;  Location: [facility] OR;  Service: Thoracic;  Laterality: N/A;    REVIEW OF SYSTEMS:   Review of Systems  Constitutional: Negative for chills, fatigue, fever and unexpected weight change.  HENT: Positive for trouble swallowing. Negative for mouth sores, nosebleeds, or sore throat  Eyes: Negative for eye problems and icterus.  Respiratory: Negative for cough, hemoptysis, shortness of breath and wheezing.   Cardiovascular: Negative for chest pain and leg swelling.  Gastrointestinal: Negative for abdominal pain, constipation, diarrhea, nausea and vomiting.  Genitourinary: Negative for bladder incontinence, difficulty urinating, dysuria, frequency and hematuria.   Musculoskeletal: Positive for neck pain. Negative for back pain, gait problem, and neck stiffness.  Skin: Negative for itching and rash.  Neurological: Negative for dizziness, extremity weakness, gait problem, headaches, light-headedness and seizures.  Hematological: Negative for adenopathy. Does not bruise/bleed easily.  Psychiatric/Behavioral: Negative for confusion, depression and sleep disturbance. The patient is not nervous/anxious.     PHYSICAL EXAMINATION:  Blood pressure 133/68, pulse 91, temperature 98.3 F (36.8 C), temperature source Oral, resp. rate 18, height 5\' 2"  (1.575 m), weight 210 lb (95.3 kg), SpO2 98 %.  ECOG PERFORMANCE STATUS: 1 - Symptomatic but completely ambulatory  Physical Exam  Constitutional: Oriented to person, place, and time and  well-developed, well-nourished, and in no distress.  HENT:  Head: Normocephalic and atraumatic.  Mouth/Throat: Oropharynx is clear and moist. No oropharyngeal exudate.  Eyes: Conjunctivae are normal. Right eye exhibits no discharge. Left eye exhibits no discharge. No scleral icterus.  Neck: Normal range of motion. Neck supple.  Cardiovascular: Normal rate, regular rhythm, normal heart sounds and intact distal pulses.   Pulmonary/Chest: Effort normal and breath sounds normal. No respiratory distress. No wheezes. No rales.  Abdominal: Soft. Bowel sounds are normal. Exhibits no distension and no mass. There is no tenderness.  Musculoskeletal: Normal range of motion of the cervical spine but mild pain with extension, rotation, and lateral bending of the cervical spine. Exhibits no edema.  Lymphadenopathy:    No cervical adenopathy.  Neurological: Alert and oriented to person, place, and time. Exhibits normal muscle tone. Gait normal. Coordination normal.  Skin: Skin is warm and dry. No rash noted. Not diaphoretic. No erythema. No pallor.  Psychiatric: Mood, memory and judgment normal.  Vitals reviewed.  LABORATORY DATA: Lab Results  Component Value Date   WBC 5.0 05/01/2019   HGB 10.2 (L) 05/01/2019   HCT 29.7 (L) 05/01/2019   MCV 99.3 05/01/2019   PLT 162 05/01/2019      Chemistry      Component Value Date/Time   NA 140 05/01/2019 1105   K 4.1 05/01/2019 1105   CL 106 05/01/2019 1105   CO2 25 05/01/2019 1105   BUN 9 05/01/2019 1105   CREATININE 0.98 05/01/2019 1105      Component Value Date/Time   CALCIUM 7.7 (L) 05/01/2019 1105   ALKPHOS 84 05/01/2019 1105   AST 18 05/01/2019 1105   ALT 32 05/01/2019 1105   BILITOT 0.5 05/01/2019 1105       RADIOGRAPHIC STUDIES:  No results found.   ASSESSMENT/PLAN:  This is a very pleasant 63 year old Caucasian female with unresectable stage IIb non-small cell lung cancer, squamous cell carcinoma. She presented with a right upper  lobe lung mass in addition to left hilar adenopathy. There was also a suspicious ground glass opacity bilaterally. She was diagnosed in April 2020.   She is  currently undergoing concurrent chemoradiation with carboplatin for an AUC of 2 and paclitaxel 45 mg/m2. She is status post 9 cycles. There was a delay in initiating radiotherapy due to her radiation being in [place], [place] instead of [place], [place]. Her last radiation treatment is scheduled for July 13th, 2020. She has been tolerating treatment well without any concerning adverse effects except for dysphagia secondary to radiation treatment.   The patient was seen with Dr. Julien Nordmann today. Labs were reviewed. We recommend she proceed with cycle #10 today as scheduled. This will be her final chemotherapy treatment.   I will arrange for the patient to obtain a restaging CT scan of the chest in 4 weeks.   We will see her back for a follow up visit in 4 weeks for evaluation and to review her scan results.   For the neck pain, the patient was encouraged to use ibuprofen or tylenol. The patient knows to call the clinic for any new or concerning symptoms.   The patient's calcium has been low routine labs. Her calcium is 7.7 today. Per chart review and discussion with the patient, she has a history of osteoporosis for which she is on fosamax and vitamin D. The patient was encouraged that she may take calcium supplements for her hypocalcemia as well as her osteoporosis.   The patient was advised to call immediately if she has any concerning symptoms in the interval. The patient voices understanding of current disease status and treatment options and is in agreement with the current care plan. All questions were answered. The patient knows to call the clinic with any problems, questions or concerns. We can certainly see the patient much sooner if necessary   Orders Placed This Encounter  Procedures  . CT Chest W Contrast    Standing Status:   Future     Standing Expiration Date:   04/30/2020    Order Specific Question:   ** REASON FOR EXAM (FREE TEXT)    Answer:   Restaging Lung Cancer    Order Specific Question:   If indicated for the ordered procedure, I authorize the administration of contrast media per Radiology protocol    Answer:   Yes    Order Specific Question:   Preferred imaging location?    Answer:   [facility]    Order Specific Question:   Radiology Contrast Protocol - do NOT remove file path    Answer:   [email or web address]      L , PA-C 05/01/19  ADDENDUM: Hematology/Oncology Attending: I had a face-to-face encounter with the patient today.  I recommended her care plan.  This is a very pleasant 62 years old white female with unresectable stage IIb non-small cell lung cancer and currently undergoing course of concurrent chemoradiation with weekly carboplatin and paclitaxel.  The patient is expected to complete the course of radiation on May 08, 2019. I recommended for her to proceed with the cycle of the chemotherapy today as planned. We will see her back for follow-up visit in 4 weeks for evaluation after repeating CT scan of the chest for restaging of her disease. For the neck pain, this could be secondary to degenerative arthritis.  I recommended for the patient to use ibuprofen 1-2 tablets on as-needed basis. She was advised to call immediately if she has any concerning symptoms in the interval.  Disclaimer: This note was dictated with voice recognition software. Similar sounding words can inadvertently be transcribed and may be missed upon review. Eilleen Kempf,  MD 05/01/19

## 2019-05-01 NOTE — Patient Instructions (Signed)

## 2019-05-02 ENCOUNTER — Telehealth: Payer: Self-pay | Admitting: Internal Medicine

## 2019-05-02 NOTE — Telephone Encounter (Signed)
Scheduled appt per 7/06 los - mailed letter with appt date and time - Ct already scheduled

## 2019-05-25 ENCOUNTER — Ambulatory Visit (HOSPITAL_COMMUNITY)
Admission: RE | Admit: 2019-05-25 | Discharge: 2019-05-25 | Disposition: A | Discharge status: Home / Self Care | Payer: Medicare Other | Source: Ambulatory Visit | Attending: Physician Assistant | Admitting: Physician Assistant

## 2019-05-25 ENCOUNTER — Encounter (HOSPITAL_COMMUNITY): Payer: Self-pay

## 2019-05-25 ENCOUNTER — Other Ambulatory Visit: Payer: Self-pay

## 2019-05-25 ENCOUNTER — Inpatient Hospital Stay: Payer: Medicare Other

## 2019-05-25 DIAGNOSIS — Z5111 Encounter for antineoplastic chemotherapy: Secondary | ICD-10-CM | POA: Diagnosis not present

## 2019-05-25 DIAGNOSIS — C3492 Malignant neoplasm of unspecified part of left bronchus or lung: Secondary | ICD-10-CM

## 2019-05-25 LAB — CBC WITH DIFFERENTIAL (CANCER CENTER ONLY)
Abs Immature Granulocytes: 0.01 10*3/uL (ref 0.00–0.07)
Basophils Absolute: 0 10*3/uL (ref 0.0–0.1)
Basophils Relative: 0 %
Eosinophils Absolute: 0.1 10*3/uL (ref 0.0–0.5)
Eosinophils Relative: 1 %
HCT: 26.8 % — ABNORMAL LOW (ref 36.0–46.0)
Hemoglobin: 9 g/dL — ABNORMAL LOW (ref 12.0–15.0)
Immature Granulocytes: 0 %
Lymphocytes Relative: 28 %
Lymphs Abs: 1.2 10*3/uL (ref 0.7–4.0)
MCH: 36.4 pg — ABNORMAL HIGH (ref 26.0–34.0)
MCHC: 33.6 g/dL (ref 30.0–36.0)
MCV: 108.5 fL — ABNORMAL HIGH (ref 80.0–100.0)
Monocytes Absolute: 0.4 10*3/uL (ref 0.1–1.0)
Monocytes Relative: 10 %
Neutro Abs: 2.5 10*3/uL (ref 1.7–7.7)
Neutrophils Relative %: 61 %
Platelet Count: 110 10*3/uL — ABNORMAL LOW (ref 150–400)
RBC: 2.47 MIL/uL — ABNORMAL LOW (ref 3.87–5.11)
RDW: 19.7 % — ABNORMAL HIGH (ref 11.5–15.5)
WBC Count: 4.2 10*3/uL (ref 4.0–10.5)
nRBC: 0 % (ref 0.0–0.2)

## 2019-05-25 LAB — CMP (CANCER CENTER ONLY)
ALT: 37 U/L (ref 0–44)
AST: 25 U/L (ref 15–41)
Albumin: 3.4 g/dL — ABNORMAL LOW (ref 3.5–5.0)
Alkaline Phosphatase: 93 U/L (ref 38–126)
Anion gap: 7 (ref 5–15)
BUN: 13 mg/dL (ref 8–23)
CO2: 28 mmol/L (ref 22–32)
Calcium: 9.3 mg/dL (ref 8.9–10.3)
Chloride: 107 mmol/L (ref 98–111)
Creatinine: 0.94 mg/dL (ref 0.44–1.00)
GFR, Est AFR Am: >60 mL/min (ref >60)
GFR, Estimated: >60 mL/min (ref >60)
Glucose, Bld: 114 mg/dL — ABNORMAL HIGH (ref 70–99)
Potassium: 3.9 mmol/L (ref 3.5–5.1)
Sodium: 142 mmol/L (ref 135–145)
Total Bilirubin: 0.5 mg/dL (ref 0.3–1.2)
Total Protein: 6.5 g/dL (ref 6.5–8.1)

## 2019-05-25 MED ORDER — IOHEXOL 300 MG/ML  SOLN
75.0000 mL | Freq: Once | INTRAMUSCULAR | Status: AC | PRN
  Administered 2019-05-25: 75 mL via INTRAVENOUS

## 2019-05-25 MED ORDER — SODIUM CHLORIDE (PF) 0.9 % IJ SOLN
INTRAMUSCULAR | Status: AC
  Filled 2019-05-25: qty 50

## 2019-05-25 MED ORDER — HEPARIN SOD (PORK) LOCK FLUSH 100 UNIT/ML IV SOLN
INTRAVENOUS | Status: AC
  Filled 2019-05-25: qty 5

## 2019-05-25 MED ORDER — HEPARIN SOD (PORK) LOCK FLUSH 100 UNIT/ML IV SOLN
500.0000 [IU] | Freq: Once | INTRAVENOUS | Status: AC
  Administered 2019-05-25: 500 [IU] via INTRAVENOUS

## 2019-05-30 ENCOUNTER — Encounter: Payer: Self-pay | Admitting: Internal Medicine

## 2019-05-30 ENCOUNTER — Encounter: Payer: Self-pay | Admitting: *Deleted

## 2019-05-30 ENCOUNTER — Other Ambulatory Visit: Payer: Self-pay

## 2019-05-30 ENCOUNTER — Inpatient Hospital Stay: Payer: Medicare Other | Attending: Internal Medicine | Admitting: Internal Medicine

## 2019-05-30 VITALS — BP 158/71 | HR 92 | Temp 98.0°F | Resp 20 | Ht 62.0 in | Wt 212.1 lb

## 2019-05-30 DIAGNOSIS — I10 Essential (primary) hypertension: Secondary | ICD-10-CM | POA: Diagnosis not present

## 2019-05-30 DIAGNOSIS — N189 Chronic kidney disease, unspecified: Secondary | ICD-10-CM | POA: Insufficient documentation

## 2019-05-30 DIAGNOSIS — F329 Major depressive disorder, single episode, unspecified: Secondary | ICD-10-CM | POA: Insufficient documentation

## 2019-05-30 DIAGNOSIS — G4730 Sleep apnea, unspecified: Secondary | ICD-10-CM | POA: Diagnosis not present

## 2019-05-30 DIAGNOSIS — E039 Hypothyroidism, unspecified: Secondary | ICD-10-CM | POA: Diagnosis not present

## 2019-05-30 DIAGNOSIS — Z7189 Other specified counseling: Secondary | ICD-10-CM | POA: Diagnosis not present

## 2019-05-30 DIAGNOSIS — C3492 Malignant neoplasm of unspecified part of left bronchus or lung: Secondary | ICD-10-CM

## 2019-05-30 DIAGNOSIS — Z8673 Personal history of transient ischemic attack (TIA), and cerebral infarction without residual deficits: Secondary | ICD-10-CM | POA: Insufficient documentation

## 2019-05-30 DIAGNOSIS — E119 Type 2 diabetes mellitus without complications: Secondary | ICD-10-CM | POA: Insufficient documentation

## 2019-05-30 DIAGNOSIS — Z7982 Long term (current) use of aspirin: Secondary | ICD-10-CM | POA: Insufficient documentation

## 2019-05-30 DIAGNOSIS — Z79899 Other long term (current) drug therapy: Secondary | ICD-10-CM | POA: Insufficient documentation

## 2019-05-30 DIAGNOSIS — Z7984 Long term (current) use of oral hypoglycemic drugs: Secondary | ICD-10-CM | POA: Diagnosis not present

## 2019-05-30 DIAGNOSIS — C3411 Malignant neoplasm of upper lobe, right bronchus or lung: Secondary | ICD-10-CM | POA: Diagnosis present

## 2019-05-30 NOTE — Progress Notes (Signed)
Oncology Nurse Navigator Documentation  Oncology Nurse Navigator Flowsheets 05/30/2019  Navigator Location [facility]  Referral Date to RadOnc/MedOnc -  Navigator Encounter Type Other/per Dr. Julien Nordmann, I contacted [place] office to update them on patient needing to see Dr. Roxan Hockey again to be evaluated for resection before consideration of immunotherapy.   Telephone -  [facility] Date -  Patient Visit Type -  Treatment Phase Post-Tx Follow-up  Barriers/Navigation Needs Coordination of Care  Education -  Interventions Coordination of Care  Coordination of Care Other  Education Method -  Acuity Level 2  Time Spent with Patient 30

## 2019-05-30 NOTE — Progress Notes (Signed)
[place] Telephone:([phone]) [phone]   [phone]  OFFICE PROGRESS NOTE  Tonya Pain, FNP [place] [id number]  DIAGNOSIS: stage IIb (T2b, N1, M0) non-small cell lung cancer, squamous cell carcinoma presented with right upper lobe lung mass in addition to left hilar adenopathy.  There was also a suspicious groundglass opacity bilaterally.  PRIOR THERAPY: Concurrent chemoradiation with weekly carboplatin for AUC of 2 and paclitaxel 45 mg/M2.  Status post 10 cycles.  She received her radiation in [facility].  Last chemotherapy was given May 01, 2019.  CURRENT THERAPY: None.  INTERVAL HISTORY: Tonya Shelton 63 y.o. female returns to the clinic today for follow-up visit.  Her daughter Rip Harbour was available by phone during the visit.  The patient is feeling fine today with no concerning complaints except for mild shortness of breath with exertion.  She denied having any dysphagia or odynophagia.  She denied having any fever or chills.  She has no chest Shelton, cough or hemoptysis.  She has no nausea, vomiting, diarrhea or constipation.  She has no headache or visual changes.  She tolerated her previous course of concurrent chemoradiation fairly well.  She had repeat CT scan of the chest performed recently and she is here for evaluation and discussion of her scan results.  MEDICAL HISTORY: Past Medical History:  Diagnosis Date  . Acid reflux   . Anemia    low iron  . Arthritis   . Chronic kidney disease    Stage 3 - Dr. Iona Beard in [place], [place]  . Depression   . Diabetes mellitus without complication ([place])    type 2  . Family history of adverse reaction to anesthesia    mom and sister have n/v  . H/O bladder problems    leaks  . History of hiatal hernia   . Hypertension   . Hypothyroidism   . Neuropathy   . Osteoporosis   . PVC's (premature ventricular contractions)   . Restless legs   . Sleep apnea    uses cpap  with oxygen  . Stroke [facility]) 2007   TIA    ALLERGIES:  is allergic to erythromycin and povidone iodine.  MEDICATIONS:  Current Outpatient Medications  Medication Sig Dispense Refill  . acetaminophen (TYLENOL) 500 MG tablet Take 1,000-1,500 mg by mouth daily as needed for moderate Shelton.     Marland Kitchen alendronate (FOSAMAX) 70 MG tablet Take 70 mg by mouth every Sunday.     Marland Kitchen aspirin EC 81 MG tablet Take 81 mg by mouth daily at 12 noon.    Marland Kitchen atorvastatin (LIPITOR) 40 MG tablet Take 40 mg by mouth at bedtime.     . carvedilol (COREG) 25 MG tablet Take 25 mg by mouth 2 (two) times daily with a meal.    . cevimeline (EVOXAC) 30 MG capsule Take 30 mg by mouth 2 (two) times daily.     . DULoxetine (CYMBALTA) 60 MG capsule Take 60 mg by mouth daily.    . ferrous sulfate 325 (65 FE) MG tablet Take 325 mg by mouth daily.   0  . gabapentin (NEURONTIN) 100 MG capsule Take 200 mg by mouth at bedtime.     Marland Kitchen glimepiride (AMARYL) 2 MG tablet Take 2 mg by mouth daily with breakfast.   0  . levothyroxine (SYNTHROID, LEVOTHROID) 88 MCG tablet Take 88 mcg by mouth daily before breakfast.    . lidocaine (XYLOCAINE) 2 % solution Use as  directed 5 mLs in the mouth or throat every 3 (three) hours as needed for mouth Shelton. Swish and swallow or spit 200 mL 2  . Magnesium 500 MG TABS Take 500 mg by mouth daily.    . Misc Natural Products (OSTEO BI-FLEX ADV TRIPLE ST PO) Take 1 tablet by mouth at bedtime.     . Multiple Vitamins-Minerals (ALIVE WOMENS 50+ PO) Take 1 tablet by mouth daily.    . Omega-3 Fatty Acids (FISH OIL PO) Take 2,000 mg by mouth 2 (two) times daily.    Marland Kitchen omeprazole (PRILOSEC) 20 MG capsule Take 40 mg by mouth daily.     Marland Kitchen OVER THE COUNTER MEDICATION Place 1-2 tablets under the tongue at bedtime. Restful legs otc    . prochlorperazine (COMPAZINE) 10 MG tablet TAKE 1 TABLET(10 MG) BY MOUTH EVERY 6 HOURS AS NEEDED FOR NAUSEA OR VOMITING 30 tablet 0  . sucralfate (CARAFATE) 1 g tablet Take 1 tablet (1 g  total) by mouth 4 (four) times daily -  with meals and at bedtime. Dissolve in 1 ounce of water. Take 1 hour before or 2 hours after taking medications 120 tablet 2  . telmisartan (MICARDIS) 80 MG tablet Take 40 mg by mouth daily at 12 noon.     Marland Kitchen tiZANidine (ZANAFLEX) 4 MG tablet Take 8 mg by mouth at bedtime.     . TRADJENTA 5 MG TABS tablet Take 5 mg by mouth daily.  0  . UNABLE TO FIND Med Name: CPAP with 2lpm o2 bled in  [place]    . Vitamin D, Ergocalciferol, (DRISDOL) 50000 units CAPS capsule Take 50,000 Units by mouth every Sunday.     . magic mouthwash SOLN Take 5 mLs by mouth 4 (four) times daily as needed for mouth Shelton. (Patient not taking: Reported on 05/30/2019) 240 mL 3  . nitroGLYCERIN (NITROSTAT) 0.4 MG SL tablet Place 0.4 mg under the tongue every 5 (five) minutes as needed for chest Shelton.     No current facility-administered medications for this visit.     SURGICAL HISTORY:  Past Surgical History:  Procedure Laterality Date  . BUNIONECTOMY    . GALLBLADDER SURGERY    . IR IMAGING GUIDED PORT INSERTION  03/07/2019  . LUMBAR LAMINECTOMY/DECOMPRESSION MICRODISCECTOMY N/A 10/15/2017   Procedure: LEFT L5-S1 MICRODISCECTOMY;  Surgeon: [name], MD;  Location: [place];  Service: Orthopedics;  Laterality: N/A;  . NEUROMA SURGERY    . SPINAL FUSION  2003   cervical  . tonsillectomy    . VIDEO BRONCHOSCOPY WITH RADIAL ENDOBRONCHIAL ULTRASOUND N/A 02/09/2019   Procedure: VIDEO BRONCHOSCOPY WITH RADIAL ENDOBRONCHIAL ULTRASOUND;  Surgeon: [name], MD;  Location: [place];  Service: Thoracic;  Laterality: N/A;    REVIEW OF SYSTEMS:  Constitutional: negative Eyes: negative Ears, nose, mouth, throat, and face: negative Respiratory: positive for dyspnea on exertion Cardiovascular: negative Gastrointestinal: negative Genitourinary:negative Integument/breast: negative Hematologic/lymphatic: negative Musculoskeletal:negative Neurological: negative Behavioral/Psych:  negative Endocrine: negative Allergic/Immunologic: negative   PHYSICAL EXAMINATION: General appearance: alert, cooperative and no distress Head: Normocephalic, without obvious abnormality, atraumatic Neck: no adenopathy, no JVD, supple, symmetrical, trachea midline and thyroid not enlarged, symmetric, no tenderness/mass/nodules Lymph nodes: Cervical, supraclavicular, and axillary nodes normal. Resp: clear to auscultation bilaterally Back: symmetric, no curvature. ROM normal. No CVA tenderness. Cardio: regular rate and rhythm, S1, S2 normal, no murmur, click, rub or gallop GI: soft, non-tender; bowel sounds normal; no masses,  no organomegaly Extremities: extremities normal, atraumatic, no cyanosis or edema  Neurologic: Alert and oriented X 3, normal strength and tone. Normal symmetric reflexes. Normal coordination and gait  ECOG PERFORMANCE STATUS: 1 - Symptomatic but completely ambulatory  Blood pressure (!) 158/71, pulse 92, temperature 98 F (36.7 C), temperature source Oral, resp. rate 20, height 5\' 2"  (1.575 m), weight 212 lb 1.6 oz (96.2 kg), SpO2 98 %.  LABORATORY DATA: Lab Results  Component Value Date   WBC 4.2 05/25/2019   HGB 9.0 (L) 05/25/2019   HCT 26.8 (L) 05/25/2019   MCV 108.5 (H) 05/25/2019   PLT 110 (L) 05/25/2019      Chemistry      Component Value Date/Time   NA 142 05/25/2019 1217   K 3.9 05/25/2019 1217   CL 107 05/25/2019 1217   CO2 28 05/25/2019 1217   BUN 13 05/25/2019 1217   CREATININE 0.94 05/25/2019 1217      Component Value Date/Time   CALCIUM 9.3 05/25/2019 1217   ALKPHOS 93 05/25/2019 1217   AST 25 05/25/2019 1217   ALT 37 05/25/2019 1217   BILITOT 0.5 05/25/2019 1217       RADIOGRAPHIC STUDIES: Ct Chest W Contrast  Result Date: 05/26/2019 CLINICAL DATA:  Lung cancer. EXAM: CT CHEST WITH CONTRAST TECHNIQUE: Multidetector CT imaging of the chest was performed during intravenous contrast administration. CONTRAST:  58mL OMNIPAQUE IOHEXOL  300 MG/ML  SOLN COMPARISON:  PET 01/19/2019. FINDINGS: Cardiovascular: Right IJ Port-A-Cath terminates in the right atrium. Atherosclerotic calcification of the aorta, aortic valve and coronary arteries. Heart size normal. No pericardial effusion. Mediastinum/Nodes: Subcentimeter low-attenuation lesion in the right thyroid. No pathologically enlarged mediastinal or axillary lymph nodes. Left hilar adenopathy measures approximately 1.4 cm, compared to 2.3 cm previously. Esophagus is grossly unremarkable. Lungs/Pleura: Centrilobular emphysema. Spiculated nodule in the left upper lobe measures 2.1 x 2.3 cm, compared to 3.7 x 3.8 cm on 01/19/2019. Mild surrounding architectural distortion. The amount of left hilar soft tissue has decreased in the interval as well. Scattered areas of mild peribronchovascular ground-glass nodularity bilaterally appear similar to minimally decreased in size. Comparison with the prior exam is challenging due to respiratory motion on that study. Index right upper lobe nodule measures 8 mm (series 5, image 33), compared to approximately 13 mm previously. No pleural fluid. Airway is unremarkable. Upper Abdomen: Visualized portions of the liver, adrenal glands, kidneys, spleen, pancreas, stomach and bowel are grossly unremarkable. No upper abdominal adenopathy. Cholecystectomy. Musculoskeletal: Degenerative changes in the spine. No worrisome lytic or sclerotic lesions. IMPRESSION: 1. Interval response to therapy as evidenced by decrease in size of primary left upper lobe mass and left hilar adenopathy. 2. Scattered peribronchovascular ground-glass nodules are stable to minimally improved. 3. Aortic atherosclerosis (ICD10-170.0). Coronary artery calcification. 4.  Emphysema (ICD10-J43.9). Electronically Signed   By: Lorin Picket M.D.   On: 05/26/2019 08:19    ASSESSMENT AND PLAN: This is a very pleasant 63 years old white female with currently unresectable stage IIB non-small cell lung  cancer, squamous cell carcinoma. The patient was a started on a course of concurrent chemoradiation with weekly carboplatin and paclitaxel.  She tolerated this course of the treatment fairly well. She had repeat CT scan of the chest performed recently.  I personally and independently reviewed the scans and discussed the result with the patient and her daughter. Her scan showed significant improvement in her disease with decrease in the size of the primary left upper lobe lung mass as well as the left hilar adenopathy. I discussed with the patient several  options for management of her condition including observation versus referral to Dr. Roxan Hockey for consideration of surgical resection if she is a good surgical candidate. If the patient is not a surgical candidate we will may discuss the option of consolidation immunotherapy but this may not be approved because of the early stage of her disease. I will arrange for the patient to have a follow-up appointment with me after her visit with Dr. Roxan Hockey. For hypertension she was advised to take her blood pressure medication and to monitor it closely at home. She was advised to call immediately if she has any other concerning symptoms in the interval. The patient voices understanding of current disease status and treatment options and is in agreement with the current care plan.  All questions were answered. The patient knows to call the clinic with any problems, questions or concerns. We can certainly see the patient much sooner if necessary.  Disclaimer: This note was dictated with voice recognition software. Similar sounding words can inadvertently be transcribed and may not be corrected upon review.

## 2019-05-31 ENCOUNTER — Telehealth: Payer: Self-pay | Admitting: Internal Medicine

## 2019-05-31 ENCOUNTER — Telehealth: Payer: Self-pay | Admitting: *Deleted

## 2019-05-31 NOTE — Telephone Encounter (Signed)
Scheduled appt per 8/4 los - pt aware of appt date and time

## 2019-05-31 NOTE — Telephone Encounter (Signed)
Oncology Nurse Navigator Documentation  Oncology Nurse Navigator Flowsheets 05/31/2019  Navigator Location [facility]  Referral Date to RadOnc/MedOnc -  Navigator Encounter Type Telephone/I called Ms. Crader to set her up to see Dr. Roxan Hockey next week at [facility].  She verbalized understanding of appt time and place.   Telephone Outgoing Call  [facility] Date -  Patient Visit Type -  Treatment Phase Post-Tx Follow-up  Barriers/Navigation Needs Coordination of Care;Education  Education Other  Interventions Coordination of Care;Education  Coordination of Care Appts  Education Method Verbal  Acuity Level 2  Time Spent with Patient 15

## 2019-06-08 ENCOUNTER — Other Ambulatory Visit: Payer: Self-pay

## 2019-06-08 ENCOUNTER — Other Ambulatory Visit: Payer: Self-pay | Admitting: *Deleted

## 2019-06-08 ENCOUNTER — Encounter: Payer: Self-pay | Admitting: Thoracic Surgery (Cardiothoracic Vascular Surgery)

## 2019-06-08 ENCOUNTER — Encounter: Payer: Self-pay | Admitting: *Deleted

## 2019-06-08 ENCOUNTER — Institutional Professional Consult (permissible substitution) (INDEPENDENT_AMBULATORY_CARE_PROVIDER_SITE_OTHER): Payer: Medicare Other | Admitting: Thoracic Surgery (Cardiothoracic Vascular Surgery)

## 2019-06-08 VITALS — BP 144/75 | HR 20 | Temp 98.3°F | Resp 20 | Wt 215.2 lb

## 2019-06-08 DIAGNOSIS — C3492 Malignant neoplasm of unspecified part of left bronchus or lung: Secondary | ICD-10-CM

## 2019-06-08 NOTE — H&P (View-Only) (Signed)
PCP is Plunk, Enid Derry, FNP Referring Provider is Curt Bears, MD  No chief complaint on file.   HPI: Tonya Shelton returns to discuss possible surgery  Tonya Shelton is a 63 yo woman with a history of tobacco abuse (quit 2014), hypertension, hypothyroidism, depression, type II diabetes, stage III CKD, neuropathy, reflux and PVCs. 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 She underwent neoadjuvant chemoradiation with carboplatin and paclitaxel. Follow up CT showed an excellent partial response. She finished chemo 05/01/2019. She tolerated the therapy without any significant side effects.  She quit smoking in 2014. No chest pain, pressure or tightness. No shortness of breath with normal activities. Her appetite is good, no significant weight loss.  Past Medical History:  Diagnosis Date  . Acid reflux   . Anemia    low iron  . Arthritis   . Chronic kidney disease    Stage 3 - Dr. Iona Beard in [place], [place]  . Depression   . Diabetes mellitus without complication ([place])    type 2  . Family history of adverse reaction to anesthesia    mom and sister have n/v  . H/O bladder problems    leaks  . History of hiatal hernia   . Hypertension   . Hypothyroidism   . Neuropathy   . Osteoporosis   . PVC's (premature ventricular contractions)   . Restless legs   . Sleep apnea    uses cpap with oxygen  . Stroke [facility]) 2007   TIA    Past Surgical History:  Procedure Laterality Date  . BUNIONECTOMY    . GALLBLADDER SURGERY    . IR IMAGING GUIDED PORT INSERTION  03/07/2019  . LUMBAR LAMINECTOMY/DECOMPRESSION MICRODISCECTOMY N/A 10/15/2017   Procedure: LEFT L5-S1 MICRODISCECTOMY;  Surgeon: [name], MD;  Location: [place];  Service: Orthopedics;  Laterality: N/A;  . NEUROMA SURGERY    . SPINAL FUSION  2003   cervical  . tonsillectomy    . VIDEO BRONCHOSCOPY WITH RADIAL ENDOBRONCHIAL ULTRASOUND N/A 02/09/2019    Procedure: VIDEO BRONCHOSCOPY WITH RADIAL ENDOBRONCHIAL ULTRASOUND;  Surgeon: [name], MD;  Location: [facility];  Service: Thoracic;  Laterality: N/A;    Family History  Problem Relation Age of Onset  . Breast cancer Mother   . Endometriosis Mother   . Alcoholism Father   . Lung cancer Neg Hx     Social History Social History   Tobacco Use  . Smoking status: Former Smoker    Packs/day: 1.50    Years: 40.00    Pack years: 60.00    Types: Cigarettes    Quit date: 10/26/2012    Years since quitting: 6.6  . Smokeless tobacco: Never Used  Substance Use Topics  . Alcohol use: No  . Drug use: No    Current Outpatient Medications  Medication Sig Dispense Refill  . acetaminophen (TYLENOL) 500 MG tablet Take 1,000-1,500 mg by mouth daily as needed for moderate pain.     Marland Kitchen alendronate (FOSAMAX) 70 MG tablet Take 70 mg by mouth every Sunday.     Marland Kitchen aspirin EC 81 MG tablet Take 81 mg by mouth daily at 12 noon.    Marland Kitchen atorvastatin (LIPITOR) 40 MG tablet Take 40 mg by mouth at bedtime.     . carvedilol (COREG) 25 MG tablet Take 25 mg by mouth 2 (two) times daily with a meal.    . cevimeline (EVOXAC) 30 MG capsule  Take 30 mg by mouth 2 (two) times daily.     . DULoxetine (CYMBALTA) 60 MG capsule Take 60 mg by mouth daily.    . ferrous sulfate 325 (65 FE) MG tablet Take 325 mg by mouth daily.   0  . gabapentin (NEURONTIN) 100 MG capsule Take 200 mg by mouth at bedtime.     Marland Kitchen glimepiride (AMARYL) 2 MG tablet Take 2 mg by mouth daily with breakfast.   0  . levothyroxine (SYNTHROID, LEVOTHROID) 88 MCG tablet Take 88 mcg by mouth daily before breakfast.    . lidocaine (XYLOCAINE) 2 % solution Use as directed 5 mLs in the mouth or throat every 3 (three) hours as needed for mouth pain. Swish and swallow or spit 200 mL 2  . magic mouthwash SOLN Take 5 mLs by mouth 4 (four) times daily as needed for mouth pain. (Patient not taking: Reported on 05/30/2019) 240 mL 3  . Magnesium 500 MG TABS Take  500 mg by mouth daily.    . Misc Natural Products (OSTEO BI-FLEX ADV TRIPLE ST PO) Take 1 tablet by mouth at bedtime.     . Multiple Vitamins-Minerals (ALIVE WOMENS 50+ PO) Take 1 tablet by mouth daily.    . nitroGLYCERIN (NITROSTAT) 0.4 MG SL tablet Place 0.4 mg under the tongue every 5 (five) minutes as needed for chest pain.    . Omega-3 Fatty Acids (FISH OIL PO) Take 2,000 mg by mouth 2 (two) times daily.    Marland Kitchen omeprazole (PRILOSEC) 20 MG capsule Take 40 mg by mouth daily.     Marland Kitchen OVER THE COUNTER MEDICATION Place 1-2 tablets under the tongue at bedtime. Restful legs otc    . prochlorperazine (COMPAZINE) 10 MG tablet TAKE 1 TABLET(10 MG) BY MOUTH EVERY 6 HOURS AS NEEDED FOR NAUSEA OR VOMITING 30 tablet 0  . sucralfate (CARAFATE) 1 g tablet Take 1 tablet (1 g total) by mouth 4 (four) times daily -  with meals and at bedtime. Dissolve in 1 ounce of water. Take 1 hour before or 2 hours after taking medications 120 tablet 2  . telmisartan (MICARDIS) 80 MG tablet Take 40 mg by mouth daily at 12 noon.     Marland Kitchen tiZANidine (ZANAFLEX) 4 MG tablet Take 8 mg by mouth at bedtime.     . TRADJENTA 5 MG TABS tablet Take 5 mg by mouth daily.  0  . UNABLE TO FIND Med Name: CPAP with 2lpm o2 bled in  [place]    . Vitamin D, Ergocalciferol, (DRISDOL) 50000 units CAPS capsule Take 50,000 Units by mouth every Sunday.      No current facility-administered medications for this visit.     Allergies  Allergen Reactions  . Erythromycin Other (See Comments)    Eye ointment only:  "Burned retinas"  . Povidone Iodine Other (See Comments)    betadine eye drop caused burning in eyes    Review of Systems  Constitutional: Negative for activity change, appetite change and unexpected weight change.  HENT: Negative for trouble swallowing and voice change.   Eyes: Negative for visual disturbance.  Respiratory: Negative for shortness of breath and wheezing.   Cardiovascular: Positive for palpitations. Negative for chest  pain and leg swelling.  Gastrointestinal: Positive for abdominal pain (reflux). Negative for abdominal distention.  Genitourinary: Negative for difficulty urinating and dysuria.  Musculoskeletal: Positive for arthralgias, back pain and myalgias.  Neurological: Positive for syncope (several months ago, none since). Negative for weakness.  Hematological: Negative for  adenopathy. Does not bruise/bleed easily.  All other systems reviewed and are negative.   BP (!) 144/75   Pulse (!) 20   Temp 98.3 F (36.8 C)   Resp 20   Wt 215 lb 3.2 oz (97.6 kg)   SpO2 95%   BMI 39.36 kg/m  Physical Exam Vitals signs reviewed.  Constitutional:      General: She is not in acute distress.    Appearance: Normal appearance. She is obese.  HENT:     Head: Normocephalic and atraumatic.  Eyes:     General: No scleral icterus.    Extraocular Movements: Extraocular movements intact.  Cardiovascular:     Rate and Rhythm: Normal rate and regular rhythm.     Heart sounds: Normal heart sounds. No murmur. No friction rub. No gallop.   Pulmonary:     Effort: Pulmonary effort is normal. No respiratory distress.     Breath sounds: Normal breath sounds. No wheezing.  Abdominal:     General: There is no distension.     Palpations: Abdomen is soft.     Tenderness: There is no abdominal tenderness.  Musculoskeletal:        General: No swelling.  Skin:    General: Skin is warm and dry.  Neurological:     General: No focal deficit present.     Mental Status: She is alert and oriented to person, place, and time.     Cranial Nerves: No cranial nerve deficit.     Motor: No weakness.     Gait: Gait normal.    Diagnostic Tests: CT CHEST WITH CONTRAST  TECHNIQUE: Multidetector CT imaging of the chest was performed during intravenous contrast administration.  CONTRAST:  73mL OMNIPAQUE IOHEXOL 300 MG/ML  SOLN  COMPARISON:  PET 01/19/2019.  FINDINGS: Cardiovascular: Right IJ Port-A-Cath terminates in  the right atrium. Atherosclerotic calcification of the aorta, aortic valve and coronary arteries. Heart size normal. No pericardial effusion.  Mediastinum/Nodes: Subcentimeter low-attenuation lesion in the right thyroid. No pathologically enlarged mediastinal or axillary lymph nodes. Left hilar adenopathy measures approximately 1.4 cm, compared to 2.3 cm previously. Esophagus is grossly unremarkable.  Lungs/Pleura: Centrilobular emphysema. Spiculated nodule in the left upper lobe measures 2.1 x 2.3 cm, compared to 3.7 x 3.8 cm on 01/19/2019. Mild surrounding architectural distortion. The amount of left hilar soft tissue has decreased in the interval as well. Scattered areas of mild peribronchovascular ground-glass nodularity bilaterally appear similar to minimally decreased in size. Comparison with the prior exam is challenging due to respiratory motion on that study. Index right upper lobe nodule measures 8 mm (series 5, image 33), compared to approximately 13 mm previously. No pleural fluid. Airway is unremarkable.  Upper Abdomen: Visualized portions of the liver, adrenal glands, kidneys, spleen, pancreas, stomach and bowel are grossly unremarkable. No upper abdominal adenopathy. Cholecystectomy.  Musculoskeletal: Degenerative changes in the spine. No worrisome lytic or sclerotic lesions.  IMPRESSION: 1. Interval response to therapy as evidenced by decrease in size of primary left upper lobe mass and left hilar adenopathy. 2. Scattered peribronchovascular ground-glass nodules are stable to minimally improved. 3. Aortic atherosclerosis (ICD10-170.0). Coronary artery calcification. 4.  Emphysema (ICD10-J43.9).   Electronically Signed   By: Lorin Picket M.D.   On: 05/26/2019 08:19 I personally reviewed the CT images and concur with the findings noted above  PFTs FVC= 2.19 (74%) FEV1= 1.55 (68%) FEV1= 1.69(74%) postbronchodilator DLCO=  4.67(98%)  Impression: Tonya Shelton is a 63 yo woman with a past history of  tobacco abuse (quit 2014), hypertension, hypothyroidism, depression, type II diabetes, stage III CKD, neuropathy, reflux and PVCs. She was diagnosed with a stage IIB(T2N1) squamous cell carcinoma of the left upper lobe earlier this year. She underwent neoadjuvant therapy with a good response.   She is in generally good health and has adequate pulmonary reserve to tolerate a left upper lobectomy.  I offered the option of Left VATS, possible thoracotomy, left upper lobectomy. She understands there is no guarantee of a cure with surgery but it does improve her chances.   I discussed the general nature of the procedure, the need for general anesthesia, the incisions to be used, and the use of a drainage tube postoperatively with her. We discussed the expected hospital stay, overall recovery and short and long term outcomes. I informed her of the indications, risks, benefits and alternatives. She understands the risks include, but are not limited to death, stroke, MI, DVT/PE, bleeding, possible need for transfusion, infections, prolonged air leaks, cardiac arrhythmias, as well as other organ system dysfunction including respiratory, renal, or GI complications.   She accepts the risks and agrees to proceed.  Plan: Left VATS, possible thoracotomy for left upper lobectomy My office will contact patient to schedule  [name], MD [facility] [phone]

## 2019-06-08 NOTE — Progress Notes (Signed)
Oncology Nurse Navigator Documentation  Oncology Nurse Navigator Flowsheets 06/08/2019  Navigator Location [facility]  Referral Date to RadOnc/MedOnc -  Navigator Encounter Type Clinic/MDC/I spoke patient at [facility].  Her treatment plan is surgery.  I updated her that she will get a call from Dr. Leonarda Salon office about date.  She verbalized understanding.   Telephone -  [facility] Date 06/08/2019  Patient Visit Type MedOnc  Treatment Phase Other  Barriers/Navigation Needs Coordination of Care;Education  Education Other  Interventions Coordination of Care;Education  Coordination of Care Other  Education Method Verbal  Acuity Level 2  Time Spent with Patient 30

## 2019-06-08 NOTE — Progress Notes (Signed)
The proposed treatment discussed in cancer conference 06/08/2019 is for discussion purpose only and is not a binding recommendation.  The patient was not physically examined nor present for their treatment recommendations.  Therefore, final treatment plans cannot be decided.

## 2019-06-08 NOTE — Progress Notes (Signed)
PCP is Plunk, Enid Derry, FNP Referring Provider is Curt Bears, MD  No chief complaint on file.   HPI: Mrs. Mckiver returns to discuss possible surgery  Francene Mcerlean is a 63 yo woman with a history of tobacco abuse (quit 2014), hypertension, hypothyroidism, depression, type II diabetes, stage III CKD, neuropathy, reflux and PVCs. 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 She underwent neoadjuvant chemoradiation with carboplatin and paclitaxel. Follow up CT showed an excellent partial response. She finished chemo 05/01/2019. She tolerated the therapy without any significant side effects.  She quit smoking in 2014. No chest pain, pressure or tightness. No shortness of breath with normal activities. Her appetite is good, no significant weight loss.  Past Medical History:  Diagnosis Date  . Acid reflux   . Anemia    low iron  . Arthritis   . Chronic kidney disease    Stage 3 - Dr. Iona Beard in [place], [place]  . Depression   . Diabetes mellitus without complication ([place])    type 2  . Family history of adverse reaction to anesthesia    mom and sister have n/v  . H/O bladder problems    leaks  . History of hiatal hernia   . Hypertension   . Hypothyroidism   . Neuropathy   . Osteoporosis   . PVC's (premature ventricular contractions)   . Restless legs   . Sleep apnea    uses cpap with oxygen  . Stroke [facility]) 2007   TIA    Past Surgical History:  Procedure Laterality Date  . BUNIONECTOMY    . GALLBLADDER SURGERY    . IR IMAGING GUIDED PORT INSERTION  03/07/2019  . LUMBAR LAMINECTOMY/DECOMPRESSION MICRODISCECTOMY N/A 10/15/2017   Procedure: LEFT L5-S1 MICRODISCECTOMY;  Surgeon: [name], MD;  Location: [place];  Service: Orthopedics;  Laterality: N/A;  . NEUROMA SURGERY    . SPINAL FUSION  2003   cervical  . tonsillectomy    . VIDEO BRONCHOSCOPY WITH RADIAL ENDOBRONCHIAL ULTRASOUND N/A 02/09/2019    Procedure: VIDEO BRONCHOSCOPY WITH RADIAL ENDOBRONCHIAL ULTRASOUND;  Surgeon: [name], MD;  Location: [facility];  Service: Thoracic;  Laterality: N/A;    Family History  Problem Relation Age of Onset  . Breast cancer Mother   . Endometriosis Mother   . Alcoholism Father   . Lung cancer Neg Hx     Social History Social History   Tobacco Use  . Smoking status: Former Smoker    Packs/day: 1.50    Years: 40.00    Pack years: 60.00    Types: Cigarettes    Quit date: 10/26/2012    Years since quitting: 6.6  . Smokeless tobacco: Never Used  Substance Use Topics  . Alcohol use: No  . Drug use: No    Current Outpatient Medications  Medication Sig Dispense Refill  . acetaminophen (TYLENOL) 500 MG tablet Take 1,000-1,500 mg by mouth daily as needed for moderate pain.     Marland Kitchen alendronate (FOSAMAX) 70 MG tablet Take 70 mg by mouth every Sunday.     Marland Kitchen aspirin EC 81 MG tablet Take 81 mg by mouth daily at 12 noon.    Marland Kitchen atorvastatin (LIPITOR) 40 MG tablet Take 40 mg by mouth at bedtime.     . carvedilol (COREG) 25 MG tablet Take 25 mg by mouth 2 (two) times daily with a meal.    . cevimeline (EVOXAC) 30 MG capsule  Take 30 mg by mouth 2 (two) times daily.     . DULoxetine (CYMBALTA) 60 MG capsule Take 60 mg by mouth daily.    . ferrous sulfate 325 (65 FE) MG tablet Take 325 mg by mouth daily.   0  . gabapentin (NEURONTIN) 100 MG capsule Take 200 mg by mouth at bedtime.     Marland Kitchen glimepiride (AMARYL) 2 MG tablet Take 2 mg by mouth daily with breakfast.   0  . levothyroxine (SYNTHROID, LEVOTHROID) 88 MCG tablet Take 88 mcg by mouth daily before breakfast.    . lidocaine (XYLOCAINE) 2 % solution Use as directed 5 mLs in the mouth or throat every 3 (three) hours as needed for mouth pain. Swish and swallow or spit 200 mL 2  . magic mouthwash SOLN Take 5 mLs by mouth 4 (four) times daily as needed for mouth pain. (Patient not taking: Reported on 05/30/2019) 240 mL 3  . Magnesium 500 MG TABS Take  500 mg by mouth daily.    . Misc Natural Products (OSTEO BI-FLEX ADV TRIPLE ST PO) Take 1 tablet by mouth at bedtime.     . Multiple Vitamins-Minerals (ALIVE WOMENS 50+ PO) Take 1 tablet by mouth daily.    . nitroGLYCERIN (NITROSTAT) 0.4 MG SL tablet Place 0.4 mg under the tongue every 5 (five) minutes as needed for chest pain.    . Omega-3 Fatty Acids (FISH OIL PO) Take 2,000 mg by mouth 2 (two) times daily.    Marland Kitchen omeprazole (PRILOSEC) 20 MG capsule Take 40 mg by mouth daily.     Marland Kitchen OVER THE COUNTER MEDICATION Place 1-2 tablets under the tongue at bedtime. Restful legs otc    . prochlorperazine (COMPAZINE) 10 MG tablet TAKE 1 TABLET(10 MG) BY MOUTH EVERY 6 HOURS AS NEEDED FOR NAUSEA OR VOMITING 30 tablet 0  . sucralfate (CARAFATE) 1 g tablet Take 1 tablet (1 g total) by mouth 4 (four) times daily -  with meals and at bedtime. Dissolve in 1 ounce of water. Take 1 hour before or 2 hours after taking medications 120 tablet 2  . telmisartan (MICARDIS) 80 MG tablet Take 40 mg by mouth daily at 12 noon.     Marland Kitchen tiZANidine (ZANAFLEX) 4 MG tablet Take 8 mg by mouth at bedtime.     . TRADJENTA 5 MG TABS tablet Take 5 mg by mouth daily.  0  . UNABLE TO FIND Med Name: CPAP with 2lpm o2 bled in  [place]    . Vitamin D, Ergocalciferol, (DRISDOL) 50000 units CAPS capsule Take 50,000 Units by mouth every Sunday.      No current facility-administered medications for this visit.     Allergies  Allergen Reactions  . Erythromycin Other (See Comments)    Eye ointment only:  "Burned retinas"  . Povidone Iodine Other (See Comments)    betadine eye drop caused burning in eyes    Review of Systems  Constitutional: Negative for activity change, appetite change and unexpected weight change.  HENT: Negative for trouble swallowing and voice change.   Eyes: Negative for visual disturbance.  Respiratory: Negative for shortness of breath and wheezing.   Cardiovascular: Positive for palpitations. Negative for chest  pain and leg swelling.  Gastrointestinal: Positive for abdominal pain (reflux). Negative for abdominal distention.  Genitourinary: Negative for difficulty urinating and dysuria.  Musculoskeletal: Positive for arthralgias, back pain and myalgias.  Neurological: Positive for syncope (several months ago, none since). Negative for weakness.  Hematological: Negative for  adenopathy. Does not bruise/bleed easily.  All other systems reviewed and are negative.   BP (!) 144/75   Pulse (!) 20   Temp 98.3 F (36.8 C)   Resp 20   Wt 215 lb 3.2 oz (97.6 kg)   SpO2 95%   BMI 39.36 kg/m  Physical Exam Vitals signs reviewed.  Constitutional:      General: She is not in acute distress.    Appearance: Normal appearance. She is obese.  HENT:     Head: Normocephalic and atraumatic.  Eyes:     General: No scleral icterus.    Extraocular Movements: Extraocular movements intact.  Cardiovascular:     Rate and Rhythm: Normal rate and regular rhythm.     Heart sounds: Normal heart sounds. No murmur. No friction rub. No gallop.   Pulmonary:     Effort: Pulmonary effort is normal. No respiratory distress.     Breath sounds: Normal breath sounds. No wheezing.  Abdominal:     General: There is no distension.     Palpations: Abdomen is soft.     Tenderness: There is no abdominal tenderness.  Musculoskeletal:        General: No swelling.  Skin:    General: Skin is warm and dry.  Neurological:     General: No focal deficit present.     Mental Status: She is alert and oriented to person, place, and time.     Cranial Nerves: No cranial nerve deficit.     Motor: No weakness.     Gait: Gait normal.    Diagnostic Tests: CT CHEST WITH CONTRAST  TECHNIQUE: Multidetector CT imaging of the chest was performed during intravenous contrast administration.  CONTRAST:  71mL OMNIPAQUE IOHEXOL 300 MG/ML  SOLN  COMPARISON:  PET 01/19/2019.  FINDINGS: Cardiovascular: Right IJ Port-A-Cath terminates in  the right atrium. Atherosclerotic calcification of the aorta, aortic valve and coronary arteries. Heart size normal. No pericardial effusion.  Mediastinum/Nodes: Subcentimeter low-attenuation lesion in the right thyroid. No pathologically enlarged mediastinal or axillary lymph nodes. Left hilar adenopathy measures approximately 1.4 cm, compared to 2.3 cm previously. Esophagus is grossly unremarkable.  Lungs/Pleura: Centrilobular emphysema. Spiculated nodule in the left upper lobe measures 2.1 x 2.3 cm, compared to 3.7 x 3.8 cm on 01/19/2019. Mild surrounding architectural distortion. The amount of left hilar soft tissue has decreased in the interval as well. Scattered areas of mild peribronchovascular ground-glass nodularity bilaterally appear similar to minimally decreased in size. Comparison with the prior exam is challenging due to respiratory motion on that study. Index right upper lobe nodule measures 8 mm (series 5, image 33), compared to approximately 13 mm previously. No pleural fluid. Airway is unremarkable.  Upper Abdomen: Visualized portions of the liver, adrenal glands, kidneys, spleen, pancreas, stomach and bowel are grossly unremarkable. No upper abdominal adenopathy. Cholecystectomy.  Musculoskeletal: Degenerative changes in the spine. No worrisome lytic or sclerotic lesions.  IMPRESSION: 1. Interval response to therapy as evidenced by decrease in size of primary left upper lobe mass and left hilar adenopathy. 2. Scattered peribronchovascular ground-glass nodules are stable to minimally improved. 3. Aortic atherosclerosis (ICD10-170.0). Coronary artery calcification. 4.  Emphysema (ICD10-J43.9).   Electronically Signed   By: Lorin Picket M.D.   On: 05/26/2019 08:19 I personally reviewed the CT images and concur with the findings noted above  PFTs FVC= 2.19 (74%) FEV1= 1.55 (68%) FEV1= 1.69(74%) postbronchodilator DLCO=  4.67(98%)  Impression: Mrs. Yott is a 63 yo woman with a past history of  tobacco abuse (quit 2014), hypertension, hypothyroidism, depression, type II diabetes, stage III CKD, neuropathy, reflux and PVCs. She was diagnosed with a stage IIB(T2N1) squamous cell carcinoma of the left upper lobe earlier this year. She underwent neoadjuvant therapy with a good response.   She is in generally good health and has adequate pulmonary reserve to tolerate a left upper lobectomy.  I offered the option of Left VATS, possible thoracotomy, left upper lobectomy. She understands there is no guarantee of a cure with surgery but it does improve her chances.   I discussed the general nature of the procedure, the need for general anesthesia, the incisions to be used, and the use of a drainage tube postoperatively with her. We discussed the expected hospital stay, overall recovery and short and long term outcomes. I informed her of the indications, risks, benefits and alternatives. She understands the risks include, but are not limited to death, stroke, MI, DVT/PE, bleeding, possible need for transfusion, infections, prolonged air leaks, cardiac arrhythmias, as well as other organ system dysfunction including respiratory, renal, or GI complications.   She accepts the risks and agrees to proceed.  Plan: Left VATS, possible thoracotomy for left upper lobectomy My office will contact patient to schedule  [name], MD [facility] [phone]

## 2019-06-13 ENCOUNTER — Other Ambulatory Visit: Payer: Self-pay | Admitting: *Deleted

## 2019-06-13 ENCOUNTER — Encounter: Payer: Self-pay | Admitting: *Deleted

## 2019-06-13 ENCOUNTER — Telehealth: Payer: Self-pay | Admitting: *Deleted

## 2019-06-13 DIAGNOSIS — C3412 Malignant neoplasm of upper lobe, left bronchus or lung: Secondary | ICD-10-CM

## 2019-06-13 NOTE — Telephone Encounter (Signed)
Oncology Nurse Navigator Documentation  Oncology Nurse Navigator Flowsheets 06/13/2019  Navigator Location [facility]  Referral Date to RadOnc/MedOnc -  Navigator Encounter Type Telephone/I followed up on Ms. Steppe's schedule.  She had an appt with Dr. Julien Nordmann tomorrow.  I followed up with him on appt and he states to cancel appt.  I called patient and cancelled appt.  She verbalized understanding.  She also had a few questions about surgery, I clarified questions.   Telephone Outgoing Call  [facility] Date -  Patient Visit Type -  Treatment Phase -  Barriers/Navigation Needs Coordination of Care;Education  Education Other  Interventions Coordination of Care;Education  Coordination of Care Other  Education Method Verbal  Acuity Level 2  Time Spent with Patient 30

## 2019-06-14 ENCOUNTER — Inpatient Hospital Stay: Payer: Medicare Other | Admitting: Internal Medicine

## 2019-06-14 ENCOUNTER — Inpatient Hospital Stay: Payer: Medicare Other

## 2019-06-26 ENCOUNTER — Other Ambulatory Visit: Payer: Self-pay | Admitting: Medical

## 2019-06-27 NOTE — Progress Notes (Signed)
[id number], [place] [address] Phone: [phone] Fax: [phone]    Your procedure is scheduled on Friday, September 4th.  Report to [facility] Main Entrance "A" at 5:30 A.M., and check in at the Admitting office.  Call this number if you have problems the morning of surgery:  [phone]  Call [phone] if you have any questions prior to your surgery date Monday-Friday 8am-4pm   Remember:  Do not eat or drink after midnight the night before your surgery   Take these medicines the morning of surgery with A SIP OF WATER  carvedilol (COREG)  DULoxetine (CYMBALTA) levothyroxine (SYNTHROID, LEVOTHROID)  omeprazole (PRILOSEC)   If needed - acetaminophen (TYLENOL), nitroGLYCERIN (NITROSTAT)    As of today, STOP taking any Aspirin (unless otherwise instructed by your surgeon), Aleve, Naproxen, Ibuprofen, Motrin, Advil, Goody's, BC's, all herbal medications, fish oil, and all vitamins.  How to Manage Your Diabetes  WHAT DO I DO ABOUT MY DIABETES MEDICATION?  Marland Kitchen Do not take glimepiride (AMARYL) OR TRADJENTA/ oral diabetes medicines (pills) the morning of surgery.  Before and After Surgery  Why is it important to control my blood sugar before and after surgery? . Improving blood sugar levels before and after surgery helps healing and can limit problems. . A way of improving blood sugar control is eating a healthy diet by: o  Eating less sugar and carbohydrates o  Increasing activity/exercise o  Talking with your doctor about reaching your blood sugar goals . High blood sugars (greater than 180 mg/dL) can raise your risk of infections and slow your recovery, so you will need to focus on controlling your diabetes during the weeks before surgery. . Make sure that the doctor who takes care of your diabetes knows about your planned surgery including the date and  location.  How do I manage my blood sugar before surgery? . Check your blood sugar at least 4 times a day, starting 2 days before surgery, to make sure that the level is not too high or low. o Check your blood sugar the morning of your surgery when you wake up and every 2 hours until you get to the Short Stay unit. . If your blood sugar is less than 70 mg/dL, you will need to treat for low blood sugar: o Do not take insulin. o Treat a low blood sugar (less than 70 mg/dL) with  cup of clear juice (cranberry or apple), 4 glucose tablets, OR glucose gel. Recheck blood sugar in 15 minutes after treatment (to make sure it is greater than 70 mg/dL). If your blood sugar is not greater than 70 mg/dL on recheck, call [phone] o  for further instructions. . Report your blood sugar to the short stay nurse when you get to Short Stay.  . If you are admitted to the hospital after surgery: o Your blood sugar will be checked by the staff and you will probably be given insulin after surgery (instead of oral diabetes medicines) to make sure you have good blood sugar levels. o The goal for blood sugar control after surgery is 80-180 mg/dL.  Reviewed and Endorsed by [facility] Patient Education Committee, August 2015   The Morning of Surgery  Do not wear jewelry, make-up or nail polish.  Do not wear lotions, powders, or perfumes/colognes, or deodorant  Do not shave 48 hours prior to surgery.  Do not bring valuables to the hospital.  [facility] is not responsible for any belongings or valuables.  If you are a smoker, DO NOT Smoke 24 hours prior to surgery IF you wear a CPAP at night please bring your mask, tubing, and machine the morning of surgery   Remember that you must have someone to transport you home after your surgery, and remain with you for 24 hours if you are discharged the same day.  Contacts, glasses, hearing aids, dentures or bridgework may not be worn into surgery.   Leave your  suitcase in the car.  After surgery it may be brought to your room.  For patients admitted to the hospital, discharge time will be determined by your treatment team.  Patients discharged the day of surgery will not be allowed to drive home.   Special instructions:   [facility]- Preparing For Surgery  Before surgery, you can play an important role. Because skin is not sterile, your skin needs to be as free of germs as possible. You can reduce the number of germs on your skin by washing with CHG (chlorahexidine gluconate) Soap before surgery.  CHG is an antiseptic cleaner which kills germs and bonds with the skin to continue killing germs even after washing.    Oral Hygiene is also important to reduce your risk of infection.  Remember - BRUSH YOUR TEETH THE MORNING OF SURGERY WITH YOUR REGULAR TOOTHPASTE  Please do not use if you have an allergy to CHG or antibacterial soaps. If your skin becomes reddened/irritated stop using the CHG.  Do not shave (including legs and underarms) for at least 48 hours prior to first CHG shower. It is OK to shave your face.  Please follow these instructions carefully.   1. Shower the NIGHT BEFORE SURGERY and the MORNING OF SURGERY with CHG Soap.   2. If you chose to wash your hair, wash your hair first as usual with your normal shampoo.  3. After you shampoo, rinse your hair and body thoroughly to remove the shampoo.  4. Use CHG as you would any other liquid soap. You can apply CHG directly to the skin and wash gently with a scrungie or a clean washcloth.   5. Apply the CHG Soap to your body ONLY FROM THE NECK DOWN.  Do not use on open wounds or open sores. Avoid contact with your eyes, ears, mouth and genitals (private parts). Wash Face and genitals (private parts)  with your normal soap.   6. Wash thoroughly, paying special attention to the area where your surgery will be performed.  7. Thoroughly rinse your body with warm water from the neck  down.  8. DO NOT shower/wash with your normal soap after using and rinsing off the CHG Soap.  9. Pat yourself dry with a CLEAN TOWEL.  10. Wear CLEAN PAJAMAS to bed the night before surgery, wear comfortable clothes the morning of surgery  11. Place CLEAN SHEETS on your bed the night of your first shower and DO NOT SLEEP WITH PETS.  Day of Surgery: Do not apply any deodorants/lotions. Please shower the morning of surgery with the CHG soap  Please wear clean clothes to the hospital/surgery center.   Remember to brush your teeth WITH YOUR REGULAR TOOTHPASTE.  Please read over the following fact sheets that you were given.

## 2019-06-27 NOTE — Telephone Encounter (Signed)
Refill reequest

## 2019-06-28 ENCOUNTER — Other Ambulatory Visit: Payer: Self-pay

## 2019-06-28 ENCOUNTER — Ambulatory Visit (HOSPITAL_COMMUNITY)
Admission: RE | Admit: 2019-06-28 | Discharge: 2019-06-28 | Disposition: A | Discharge status: Home / Self Care | Payer: Medicare Other | Source: Ambulatory Visit | Attending: Thoracic Surgery (Cardiothoracic Vascular Surgery) | Admitting: Thoracic Surgery (Cardiothoracic Vascular Surgery)

## 2019-06-28 ENCOUNTER — Encounter (HOSPITAL_COMMUNITY): Payer: Self-pay

## 2019-06-28 ENCOUNTER — Other Ambulatory Visit (HOSPITAL_COMMUNITY)
Admission: RE | Admit: 2019-06-28 | Discharge: 2019-06-28 | Disposition: A | Discharge status: Home / Self Care | Payer: Medicare Other | Source: Ambulatory Visit | Attending: Thoracic Surgery (Cardiothoracic Vascular Surgery) | Admitting: Thoracic Surgery (Cardiothoracic Vascular Surgery)

## 2019-06-28 ENCOUNTER — Telehealth: Payer: Self-pay | Admitting: *Deleted

## 2019-06-28 ENCOUNTER — Encounter (HOSPITAL_COMMUNITY)
Admission: RE | Admit: 2019-06-28 | Discharge: 2019-06-28 | Disposition: A | Discharge status: Home / Self Care | Payer: Medicare Other | Source: Ambulatory Visit | Attending: Thoracic Surgery (Cardiothoracic Vascular Surgery) | Admitting: Thoracic Surgery (Cardiothoracic Vascular Surgery)

## 2019-06-28 DIAGNOSIS — C3412 Malignant neoplasm of upper lobe, left bronchus or lung: Secondary | ICD-10-CM | POA: Insufficient documentation

## 2019-06-28 HISTORY — DX: Anxiety disorder, unspecified: F41.9

## 2019-06-28 HISTORY — DX: Dyspnea, unspecified: R06.00

## 2019-06-28 LAB — CBC
HCT: 35.3 % — ABNORMAL LOW (ref 36.0–46.0)
Hemoglobin: 11.6 g/dL — ABNORMAL LOW (ref 12.0–15.0)
MCH: 37.2 pg — ABNORMAL HIGH (ref 26.0–34.0)
MCHC: 32.9 g/dL (ref 30.0–36.0)
MCV: 113.1 fL — ABNORMAL HIGH (ref 80.0–100.0)
Platelets: 317 10*3/uL (ref 150–400)
RBC: 3.12 MIL/uL — ABNORMAL LOW (ref 3.87–5.11)
RDW: 13.5 % (ref 11.5–15.5)
WBC: 7.8 10*3/uL (ref 4.0–10.5)
nRBC: 0 % (ref 0.0–0.2)

## 2019-06-28 LAB — BLOOD GAS, ARTERIAL
Acid-Base Excess: 1.7 mmol/L (ref 0.0–2.0)
Bicarbonate: 26 mmol/L (ref 20.0–28.0)
Drawn by: 265211
FIO2: 21
O2 Saturation: 95.8 %
Patient temperature: 98.6
pCO2 arterial: 43 mmHg (ref 32.0–48.0)
pH, Arterial: 7.399 (ref 7.350–7.450)
pO2, Arterial: 97.4 mmHg (ref 83.0–108.0)

## 2019-06-28 LAB — SURGICAL PCR SCREEN
MRSA, PCR: NEGATIVE
Staphylococcus aureus: NEGATIVE

## 2019-06-28 LAB — GLUCOSE, CAPILLARY: Glucose-Capillary: 160 mg/dL — ABNORMAL HIGH (ref 70–99)

## 2019-06-28 LAB — URINALYSIS, ROUTINE W REFLEX MICROSCOPIC
Bilirubin Urine: NEGATIVE
Glucose, UA: NEGATIVE mg/dL
Hgb urine dipstick: NEGATIVE
Ketones, ur: NEGATIVE mg/dL
Leukocytes,Ua: NEGATIVE
Nitrite: NEGATIVE
Protein, ur: NEGATIVE mg/dL
Specific Gravity, Urine: 1.011 (ref 1.005–1.030)
pH: 5 (ref 5.0–8.0)

## 2019-06-28 LAB — HEMOGLOBIN A1C
Hgb A1c MFr Bld: 5.3 % (ref 4.8–5.6)
Mean Plasma Glucose: 105.41 mg/dL

## 2019-06-28 LAB — COMPREHENSIVE METABOLIC PANEL
ALT: 32 U/L (ref 0–44)
AST: 28 U/L (ref 15–41)
Albumin: 3.6 g/dL (ref 3.5–5.0)
Alkaline Phosphatase: 78 U/L (ref 38–126)
Anion gap: 14 (ref 5–15)
BUN: 9 mg/dL (ref 8–23)
CO2: 21 mmol/L — ABNORMAL LOW (ref 22–32)
Calcium: 9.4 mg/dL (ref 8.9–10.3)
Chloride: 104 mmol/L (ref 98–111)
Creatinine, Ser: 1.02 mg/dL — ABNORMAL HIGH (ref 0.44–1.00)
GFR calc Af Amer: >60 mL/min (ref >60)
GFR calc non Af Amer: 58 mL/min — ABNORMAL LOW (ref >60)
Glucose, Bld: 153 mg/dL — ABNORMAL HIGH (ref 70–99)
Potassium: 4 mmol/L (ref 3.5–5.1)
Sodium: 139 mmol/L (ref 135–145)
Total Bilirubin: 0.6 mg/dL (ref 0.3–1.2)
Total Protein: 6.6 g/dL (ref 6.5–8.1)

## 2019-06-28 LAB — APTT: aPTT: 28 seconds (ref 24–36)

## 2019-06-28 LAB — PROTIME-INR
INR: 1 (ref 0.8–1.2)
Prothrombin Time: 13.2 seconds (ref 11.4–15.2)

## 2019-06-28 LAB — SARS CORONAVIRUS 2 (TAT 6-24 HRS): SARS Coronavirus 2: NEGATIVE

## 2019-06-28 LAB — ABO/RH: ABO/RH(D): O POS

## 2019-06-28 NOTE — Progress Notes (Signed)
PCP - Tiffany Plunk in [place], [place] Cardiologist - DR. Janalee Dane In [place] Nephrologist: Dr. Skipper Cliche    Chest x-ray - today EKG - today Stress Test - 2017 ECHO - 2017 Cardiac Cath - na  Sleep Study - 2007 CPAP - yes  Fasting Blood Sugar 140-200 Checks Blood Sugar ___1-2__ times a day  Blood Thinner Instructions: Aspirin Instructions: will stop today  Anesthesia review: medical hx.--requested notes nephrology, stress/echo /note from cardiologist  Patient denies shortness of breath, fever, cough and chest pain at PAT appointment   Patient verbalized understanding of instructions that were given to them at the PAT appointment. Patient was also instructed that they will need to review over the PAT instructions again at home before surgery.

## 2019-06-28 NOTE — Progress Notes (Signed)
[id number] [address]a [address], [place] [address] Phone: [phone] Fax: [phone]    Your procedure is scheduled on Friday, September 4th.  Report to [facility] Main Entrance "A" at 5:30 A.M., and check in at the Admitting office.  Call this number if you have problems the morning of surgery:  [phone]  Call [phone] if you have any questions prior to your surgery date Monday-Friday 8am-4pm   Remember:  Do not eat or drink after midnight the night before your surgery   Take these medicines the morning of surgery with A SIP OF WATER  carvedilol (COREG)  DULoxetine (CYMBALTA) levothyroxine (SYNTHROID, LEVOTHROID)  omeprazole (PRILOSEC)   If needed - acetaminophen (TYLENOL), nitroGLYCERIN (NITROSTAT)    As of today, STOP taking any Aspirin (unless otherwise instructed by your surgeon), Aleve, Naproxen, Ibuprofen, Motrin, Advil, Goody's, BC's, all herbal medications, fish oil, and all vitamins.    How to Manage Your Diabetes  WHAT DO I DO ABOUT MY DIABETES MEDICATION?  Marland Kitchen Do not take glimepiride (AMARYL) OR TRADJENTA/ oral diabetes medicines (pills) the morning of surgery.  Before and After Surgery  Why is it important to control my blood sugar before and after surgery? . Improving blood sugar levels before and after surgery helps healing and can limit problems. . A way of improving blood sugar control is eating a healthy diet by: o  Eating less sugar and carbohydrates o  Increasing activity/exercise o  Talking with your doctor about reaching your blood sugar goals . High blood sugars (greater than 180 mg/dL) can raise your risk of infections and slow your recovery, so you will need to focus on controlling your diabetes during the weeks before surgery. . Make sure that the doctor who takes care of your diabetes knows about your planned surgery including the date and  location.  How do I manage my blood sugar before surgery? . Check your blood sugar at least 4 times a day, starting 2 days before surgery, to make sure that the level is not too high or low. o Check your blood sugar the morning of your surgery when you wake up and every 2 hours until you get to the Short Stay unit. . If your blood sugar is less than 70 mg/dL, you will need to treat for low blood sugar: o Do not take insulin. o Treat a low blood sugar (less than 70 mg/dL) with  cup of clear juice (cranberry or apple), 4 glucose tablets, OR glucose gel. Recheck blood sugar in 15 minutes after treatment (to make sure it is greater than 70 mg/dL). If your blood sugar is not greater than 70 mg/dL on recheck, call [phone] o  for further instructions. . Report your blood sugar to the short stay nurse when you get to Short Stay.  . If you are admitted to the hospital after surgery: o Your blood sugar will be checked by the staff and you will probably be given insulin after surgery (instead of oral diabetes medicines) to make sure you have good blood sugar levels. o The goal for blood sugar control after surgery is 80-180 mg/dL.  Reviewed and Endorsed by [facility] Patient Education Committee, August 2015   The Morning of Surgery  Do not wear jewelry, make-up or nail polish.  Do not wear lotions, powders, or perfumes/colognes, or deodorant  Do not shave 48 hours prior to surgery.  Do not bring valuables to the hospital.  [facility] is not responsible for any belongings or valuables.  If you are a smoker, DO NOT Smoke 24 hours prior to surgery IF you wear a CPAP at night please bring your mask, tubing, and machine the morning of surgery   Remember that you must have someone to transport you home after your surgery, and remain with you for 24 hours if you are discharged the same day.  Contacts, glasses, hearing aids, dentures or bridgework may not be worn into surgery.   Leave your  suitcase in the car.  After surgery it may be brought to your room.  For patients admitted to the hospital, discharge time will be determined by your treatment team.  Patients discharged the day of surgery will not be allowed to drive home.   Special instructions:   [place]- Preparing For Surgery  Before surgery, you can play an important role. Because skin is not sterile, your skin needs to be as free of germs as possible. You can reduce the number of germs on your skin by washing with CHG (chlorahexidine gluconate) Soap before surgery.  CHG is an antiseptic cleaner which kills germs and bonds with the skin to continue killing germs even after washing.    Oral Hygiene is also important to reduce your risk of infection.  Remember - BRUSH YOUR TEETH THE MORNING OF SURGERY WITH YOUR REGULAR TOOTHPASTE  Please do not use if you have an allergy to CHG or antibacterial soaps. If your skin becomes reddened/irritated stop using the CHG.  Do not shave (including legs and underarms) for at least 48 hours prior to first CHG shower. It is OK to shave your face.  Please follow these instructions carefully.   1. Shower the NIGHT BEFORE SURGERY and the MORNING OF SURGERY with CHG Soap.   2. If you chose to wash your hair, wash your hair first as usual with your normal shampoo.  3. After you shampoo, rinse your hair and body thoroughly to remove the shampoo.  4. Use CHG as you would any other liquid soap. You can apply CHG directly to the skin and wash gently with a scrungie or a clean washcloth.   5. Apply the CHG Soap to your body ONLY FROM THE NECK DOWN.  Do not use on open wounds or open sores. Avoid contact with your eyes, ears, mouth and genitals (private parts). Wash Face and genitals (private parts)  with your normal soap.   6. Wash thoroughly, paying special attention to the area where your surgery will be performed.  7. Thoroughly rinse your body with warm water from the neck  down.  8. DO NOT shower/wash with your normal soap after using and rinsing off the CHG Soap.  9. Pat yourself dry with a CLEAN TOWEL.  10. Wear CLEAN PAJAMAS to bed the night before surgery, wear comfortable clothes the morning of surgery  11. Place CLEAN SHEETS on your bed the night of your first shower and DO NOT SLEEP WITH PETS.  Day of Surgery: Do not apply any deodorants/lotions. Please shower the morning of surgery with the CHG soap  Please wear clean clothes to the hospital/surgery center.   Remember to brush your teeth WITH YOUR REGULAR TOOTHPASTE.  Please read over the following fact sheets that you were given.

## 2019-06-28 NOTE — Telephone Encounter (Signed)
Oncology Nurse Navigator Documentation  Oncology Nurse Navigator Flowsheets 06/28/2019  Navigator Location [facility]  Referral Date to RadOnc/MedOnc -  Navigator Encounter Type Telephone/I followed up on Tonya Shelton's surgery date.  She is scheduled for surgery next week.  I called her today to see if she had any questions or concerns needing to be addressed.  I was unable to reach her but did leave a vm message with my name and phone number to call.   Telephone Outgoing Call  [facility] Date -  Patient Visit Type -  Treatment Phase -  Barriers/Navigation Needs Education  Education Other  Interventions Education  Coordination of Care -  Education Method Verbal  Acuity Level 1  Time Spent with Patient 15

## 2019-06-29 NOTE — Anesthesia Preprocedure Evaluation (Addendum)
Anesthesia Evaluation  Patient identified by MRN, date of birth, ID band Patient awake    Reviewed: Allergy & Precautions, NPO status , Patient's Chart, lab work & pertinent test results  Airway Mallampati: II  TM Distance: >3 FB Neck ROM: Full    Dental  (+) Edentulous Upper, Edentulous Lower   Pulmonary former smoker,     + decreased breath sounds      Cardiovascular hypertension,  Rhythm:Regular Rate:Normal     Neuro/Psych    GI/Hepatic   Endo/Other  diabetes  Renal/GU      Musculoskeletal   Abdominal (+) + obese,   Peds  Hematology   Anesthesia Other Findings   Reproductive/Obstetrics                           Anesthesia Physical Anesthesia Plan  ASA: III  Anesthesia Plan: General   Post-op Pain Management:    Induction: Intravenous  PONV Risk Score and Plan: Ondansetron  Airway Management Planned: Double Lumen EBT  Additional Equipment: Arterial line, CVP and Ultrasound Guidance Line Placement  Intra-op Plan:   Post-operative Plan: Extubation in OR  Informed Consent: I have reviewed the patients History and Physical, chart, labs and discussed the procedure including the risks, benefits and alternatives for the proposed anesthesia with the patient or authorized representative who has indicated his/her understanding and acceptance.       Plan Discussed with: Anesthesiologist and CRNA  Anesthesia Plan Comments: (CKD III followed by [facility] nephrology in [place]. Per note 06/08/19 baseline creatinine  Around 0.98.  Follows with cardiology Dr. Luiz Ochoa at [facility]. Per OV note 04/20/19 "63 year old Caucasian lady with hypertension with hypertensive heart disease, diastolic dysfunction, stationary renal disease, diabetes, PVCs and recent diagnosis of left upper lobe cancer. She is on chemotherapy and radiation and may need surgery. She has restrictive  lung disease on pulmonary function tests. Prior stress tests have been unremarkable. Most recent echocardiogram from March 2019 revealed normal biventricular function and no major valvular abnormalities...had echo 3/19. ct BB peri-operatively, consider stress test pre-operatively if non-emergent surgery. would be low to moderate Cv risk as things stand."  I called Dr. Renold Genta office to confirm pt did not need any additional testing prior to surgery. He reviewed to chart and said she is okay to proceed at low to mod risk. Pt also had a more recent echo done 01/07/18. Fax was not sending so his nurse read the conclusion to me which stated EF 65-70% with no valvular abnormalities.   Nuclear stress test 04/30/16 [facility]): Impression: 1. Normal study. 2. Lexiscan Cardiolite stress test with myocardial SPECT imaging does not show any convincing evidence of ischemia or infarction by nuclear criteria. Extremely small size low-grade fixed distal anterior defect with preserved wall motion is consistent with artifact. 3. Small left ventricular cavity size with hyperdynamic function. Ejection fraction 90%.  Echo 12/01/14 ([place]): Summary: Normal LV systolic function. Ejection fraction is 55-60%. There is normal LV wall thickness. Normal right ventricular size with normal function.)    Anesthesia Quick Evaluation

## 2019-06-30 ENCOUNTER — Inpatient Hospital Stay (HOSPITAL_COMMUNITY)
Admission: RE | Admit: 2019-06-30 | Discharge: 2019-07-05 | DRG: 164 | Disposition: A | Discharge status: Home / Self Care | Payer: Medicare Other | Attending: Thoracic Surgery (Cardiothoracic Vascular Surgery) | Admitting: Thoracic Surgery (Cardiothoracic Vascular Surgery)

## 2019-06-30 ENCOUNTER — Other Ambulatory Visit: Payer: Self-pay

## 2019-06-30 ENCOUNTER — Encounter (HOSPITAL_COMMUNITY): Payer: Self-pay

## 2019-06-30 ENCOUNTER — Inpatient Hospital Stay (HOSPITAL_COMMUNITY): Payer: Medicare Other

## 2019-06-30 ENCOUNTER — Inpatient Hospital Stay (HOSPITAL_COMMUNITY): Payer: Medicare Other | Admitting: Vascular Surgery

## 2019-06-30 ENCOUNTER — Inpatient Hospital Stay (HOSPITAL_COMMUNITY): Payer: Medicare Other | Admitting: Certified Registered Nurse Anesthetist

## 2019-06-30 ENCOUNTER — Encounter (HOSPITAL_COMMUNITY)
Admission: RE | Disposition: A | Discharge status: Home / Self Care | Payer: Self-pay | Source: Home / Self Care | Attending: Thoracic Surgery (Cardiothoracic Vascular Surgery)

## 2019-06-30 DIAGNOSIS — Z7984 Long term (current) use of oral hypoglycemic drugs: Secondary | ICD-10-CM

## 2019-06-30 DIAGNOSIS — I454 Nonspecific intraventricular block: Secondary | ICD-10-CM | POA: Diagnosis present

## 2019-06-30 DIAGNOSIS — Z8673 Personal history of transient ischemic attack (TIA), and cerebral infarction without residual deficits: Secondary | ICD-10-CM

## 2019-06-30 DIAGNOSIS — E1122 Type 2 diabetes mellitus with diabetic chronic kidney disease: Secondary | ICD-10-CM | POA: Diagnosis present

## 2019-06-30 DIAGNOSIS — F329 Major depressive disorder, single episode, unspecified: Secondary | ICD-10-CM | POA: Diagnosis present

## 2019-06-30 DIAGNOSIS — D62 Acute posthemorrhagic anemia: Secondary | ICD-10-CM | POA: Diagnosis not present

## 2019-06-30 DIAGNOSIS — C3412 Malignant neoplasm of upper lobe, left bronchus or lung: Secondary | ICD-10-CM | POA: Diagnosis present

## 2019-06-30 DIAGNOSIS — E1140 Type 2 diabetes mellitus with diabetic neuropathy, unspecified: Secondary | ICD-10-CM | POA: Diagnosis present

## 2019-06-30 DIAGNOSIS — Z7983 Long term (current) use of bisphosphonates: Secondary | ICD-10-CM | POA: Diagnosis not present

## 2019-06-30 DIAGNOSIS — Z803 Family history of malignant neoplasm of breast: Secondary | ICD-10-CM | POA: Diagnosis not present

## 2019-06-30 DIAGNOSIS — E039 Hypothyroidism, unspecified: Secondary | ICD-10-CM | POA: Diagnosis present

## 2019-06-30 DIAGNOSIS — Z902 Acquired absence of lung [part of]: Secondary | ICD-10-CM

## 2019-06-30 DIAGNOSIS — I2510 Atherosclerotic heart disease of native coronary artery without angina pectoris: Secondary | ICD-10-CM | POA: Diagnosis present

## 2019-06-30 DIAGNOSIS — Z981 Arthrodesis status: Secondary | ICD-10-CM

## 2019-06-30 DIAGNOSIS — Z20828 Contact with and (suspected) exposure to other viral communicable diseases: Secondary | ICD-10-CM | POA: Diagnosis present

## 2019-06-30 DIAGNOSIS — Z87891 Personal history of nicotine dependence: Secondary | ICD-10-CM | POA: Diagnosis not present

## 2019-06-30 DIAGNOSIS — Z811 Family history of alcohol abuse and dependence: Secondary | ICD-10-CM

## 2019-06-30 DIAGNOSIS — Z79890 Hormone replacement therapy (postmenopausal): Secondary | ICD-10-CM

## 2019-06-30 DIAGNOSIS — K5900 Constipation, unspecified: Secondary | ICD-10-CM | POA: Diagnosis present

## 2019-06-30 DIAGNOSIS — N189 Chronic kidney disease, unspecified: Secondary | ICD-10-CM | POA: Diagnosis present

## 2019-06-30 DIAGNOSIS — I129 Hypertensive chronic kidney disease with stage 1 through stage 4 chronic kidney disease, or unspecified chronic kidney disease: Secondary | ICD-10-CM | POA: Diagnosis present

## 2019-06-30 DIAGNOSIS — E669 Obesity, unspecified: Secondary | ICD-10-CM | POA: Diagnosis present

## 2019-06-30 DIAGNOSIS — I700 Atherosclerosis of aorta: Secondary | ICD-10-CM | POA: Diagnosis present

## 2019-06-30 DIAGNOSIS — M810 Age-related osteoporosis without current pathological fracture: Secondary | ICD-10-CM | POA: Diagnosis present

## 2019-06-30 DIAGNOSIS — Z7982 Long term (current) use of aspirin: Secondary | ICD-10-CM | POA: Diagnosis not present

## 2019-06-30 DIAGNOSIS — Z79899 Other long term (current) drug therapy: Secondary | ICD-10-CM

## 2019-06-30 DIAGNOSIS — G2581 Restless legs syndrome: Secondary | ICD-10-CM | POA: Diagnosis present

## 2019-06-30 DIAGNOSIS — Z9889 Other specified postprocedural states: Secondary | ICD-10-CM

## 2019-06-30 HISTORY — DX: Malignant neoplasm of upper lobe, left bronchus or lung: C34.12

## 2019-06-30 HISTORY — PX: VIDEO ASSISTED THORACOSCOPY (VATS)/ LOBECTOMY: SHX6169

## 2019-06-30 LAB — PREPARE RBC (CROSSMATCH)

## 2019-06-30 LAB — GLUCOSE, CAPILLARY
Glucose-Capillary: 155 mg/dL — ABNORMAL HIGH (ref 70–99)
Glucose-Capillary: 222 mg/dL — ABNORMAL HIGH (ref 70–99)
Glucose-Capillary: 211 mg/dL — ABNORMAL HIGH (ref 70–99)

## 2019-06-30 SURGERY — VIDEO ASSISTED THORACOSCOPY (VATS)/ LOBECTOMY
Anesthesia: General | Site: Chest | Laterality: Left

## 2019-06-30 MED ORDER — INSULIN ASPART 100 UNIT/ML ~~LOC~~ SOLN
0.0000 [IU] | SUBCUTANEOUS | Status: DC
  Administered 2019-06-30 (×2): 8 [IU] via SUBCUTANEOUS
  Administered 2019-07-01: 20:00:00 2 [IU] via SUBCUTANEOUS
  Administered 2019-07-01: 4 [IU] via SUBCUTANEOUS
  Administered 2019-07-01: 2 [IU] via SUBCUTANEOUS
  Administered 2019-07-01 (×2): 4 [IU] via SUBCUTANEOUS
  Administered 2019-07-02 (×5): 2 [IU] via SUBCUTANEOUS
  Administered 2019-07-03: 21:00:00 4 [IU] via SUBCUTANEOUS
  Administered 2019-07-03: 2 [IU] via SUBCUTANEOUS
  Administered 2019-07-04: 4 [IU] via SUBCUTANEOUS

## 2019-06-30 MED ORDER — CEFAZOLIN SODIUM-DEXTROSE 2-4 GM/100ML-% IV SOLN
INTRAVENOUS | Status: AC
  Filled 2019-06-30: qty 100

## 2019-06-30 MED ORDER — HYDROMORPHONE 1 MG/ML IV SOLN
INTRAVENOUS | Status: DC
  Administered 2019-06-30: 17:00:00 via INTRAVENOUS
  Administered 2019-06-30: 1.2 mg via INTRAVENOUS
  Administered 2019-07-01: 3.6 mg via INTRAVENOUS
  Administered 2019-07-01: 1.5 mg via INTRAVENOUS
  Administered 2019-07-01: 0.9 mg via INTRAVENOUS
  Administered 2019-07-01: 0.3 mg via INTRAVENOUS
  Administered 2019-07-01: 1.2 mg via INTRAVENOUS
  Administered 2019-07-01: 1.5 mg via INTRAVENOUS
  Administered 2019-07-02: 0 mg via INTRAVENOUS
  Administered 2019-07-02: 0.6 mg via INTRAVENOUS
  Administered 2019-07-02 – 2019-07-03 (×2): 0 mg via INTRAVENOUS
  Administered 2019-07-03: 1.5 mg via INTRAVENOUS
  Filled 2019-06-30 (×2): qty 30

## 2019-06-30 MED ORDER — SODIUM CHLORIDE 0.9 % IV SOLN
INTRAVENOUS | Status: DC
  Administered 2019-06-30 – 2019-07-01 (×2): via INTRAVENOUS

## 2019-06-30 MED ORDER — DULOXETINE HCL 60 MG PO CPEP
60.0000 mg | ORAL_CAPSULE | Freq: Every day | ORAL | Status: DC
  Administered 2019-07-01 – 2019-07-05 (×5): 60 mg via ORAL
  Filled 2019-06-30 (×5): qty 1

## 2019-06-30 MED ORDER — ORAL CARE MOUTH RINSE
15.0000 mL | Freq: Two times a day (BID) | OROMUCOSAL | Status: DC
  Administered 2019-07-01 – 2019-07-04 (×8): 15 mL via OROMUCOSAL

## 2019-06-30 MED ORDER — HEMOSTATIC AGENTS (NO CHARGE) OPTIME
TOPICAL | Status: DC | PRN
  Administered 2019-06-30: 1 via TOPICAL

## 2019-06-30 MED ORDER — SODIUM CHLORIDE (PF) 0.9 % IJ SOLN
INTRAMUSCULAR | Status: DC | PRN
  Administered 2019-06-30: 50 mL

## 2019-06-30 MED ORDER — 0.9 % SODIUM CHLORIDE (POUR BTL) OPTIME
TOPICAL | Status: DC | PRN
  Administered 2019-06-30: 3000 mL

## 2019-06-30 MED ORDER — LACTATED RINGERS IV SOLN
INTRAVENOUS | Status: DC | PRN
  Administered 2019-06-30: 07:00:00 via INTRAVENOUS

## 2019-06-30 MED ORDER — ONDANSETRON HCL 4 MG/2ML IJ SOLN
INTRAMUSCULAR | Status: DC | PRN
  Administered 2019-06-30: 4 mg via INTRAVENOUS

## 2019-06-30 MED ORDER — FENTANYL CITRATE (PF) 250 MCG/5ML IJ SOLN
INTRAMUSCULAR | Status: AC
  Filled 2019-06-30: qty 5

## 2019-06-30 MED ORDER — BUPIVACAINE LIPOSOME 1.3 % IJ SUSP
20.0000 mL | INTRAMUSCULAR | Status: DC
  Filled 2019-06-30: qty 20

## 2019-06-30 MED ORDER — MIDAZOLAM HCL 2 MG/2ML IJ SOLN
INTRAMUSCULAR | Status: DC | PRN
  Administered 2019-06-30 (×2): 1 mg via INTRAVENOUS

## 2019-06-30 MED ORDER — PHENYLEPHRINE 40 MCG/ML (10ML) SYRINGE FOR IV PUSH (FOR BLOOD PRESSURE SUPPORT)
PREFILLED_SYRINGE | INTRAVENOUS | Status: AC
  Filled 2019-06-30: qty 10

## 2019-06-30 MED ORDER — OXYCODONE HCL 5 MG PO TABS
5.0000 mg | ORAL_TABLET | ORAL | Status: DC | PRN
  Administered 2019-06-30 – 2019-07-04 (×6): 5 mg via ORAL
  Filled 2019-06-30 (×2): qty 1
  Filled 2019-06-30: qty 2
  Filled 2019-06-30: qty 1
  Filled 2019-06-30: qty 2
  Filled 2019-06-30: qty 1

## 2019-06-30 MED ORDER — ENOXAPARIN SODIUM 40 MG/0.4ML ~~LOC~~ SOLN
40.0000 mg | SUBCUTANEOUS | Status: DC
  Administered 2019-06-30 – 2019-07-04 (×5): 40 mg via SUBCUTANEOUS
  Filled 2019-06-30 (×5): qty 0.4

## 2019-06-30 MED ORDER — ROCURONIUM BROMIDE 10 MG/ML (PF) SYRINGE
PREFILLED_SYRINGE | INTRAVENOUS | Status: DC | PRN
  Administered 2019-06-30: 20 mg via INTRAVENOUS
  Administered 2019-06-30: 10 mg via INTRAVENOUS
  Administered 2019-06-30: 50 mg via INTRAVENOUS
  Administered 2019-06-30: 20 mg via INTRAVENOUS
  Administered 2019-06-30 (×2): 40 mg via INTRAVENOUS
  Administered 2019-06-30: 20 mg via INTRAVENOUS

## 2019-06-30 MED ORDER — PROPOFOL 10 MG/ML IV BOLUS
INTRAVENOUS | Status: AC
  Filled 2019-06-30: qty 20

## 2019-06-30 MED ORDER — BUPIVACAINE LIPOSOME 1.3 % IJ SUSP
INTRAMUSCULAR | Status: DC | PRN
  Administered 2019-06-30: 20 mL

## 2019-06-30 MED ORDER — ONDANSETRON HCL 4 MG/2ML IJ SOLN
INTRAMUSCULAR | Status: AC
  Filled 2019-06-30: qty 2

## 2019-06-30 MED ORDER — BUPIVACAINE HCL 0.5 % IJ SOLN
INTRAMUSCULAR | Status: DC | PRN
  Administered 2019-06-30: 30 mL

## 2019-06-30 MED ORDER — ROCURONIUM BROMIDE 10 MG/ML (PF) SYRINGE
PREFILLED_SYRINGE | INTRAVENOUS | Status: AC
  Filled 2019-06-30: qty 30

## 2019-06-30 MED ORDER — ONDANSETRON HCL 4 MG/2ML IJ SOLN: 4.0000 mg | Freq: Four times a day (QID) | INTRAMUSCULAR | Status: DC | PRN

## 2019-06-30 MED ORDER — DEXAMETHASONE SODIUM PHOSPHATE 10 MG/ML IJ SOLN
INTRAMUSCULAR | Status: AC
  Filled 2019-06-30: qty 1

## 2019-06-30 MED ORDER — PROPOFOL 10 MG/ML IV BOLUS
INTRAVENOUS | Status: DC | PRN
  Administered 2019-06-30: 130 mg via INTRAVENOUS

## 2019-06-30 MED ORDER — SODIUM CHLORIDE 0.9% FLUSH: 9.0000 mL | INTRAVENOUS | Status: DC | PRN

## 2019-06-30 MED ORDER — MORPHINE SULFATE (PF) 2 MG/ML IV SOLN: 2.0000 mg | INTRAVENOUS | Status: DC | PRN

## 2019-06-30 MED ORDER — ACETAMINOPHEN 500 MG PO TABS
1000.0000 mg | ORAL_TABLET | Freq: Four times a day (QID) | ORAL | Status: DC
  Administered 2019-06-30 – 2019-07-05 (×17): 1000 mg via ORAL
  Filled 2019-06-30 (×17): qty 2

## 2019-06-30 MED ORDER — ALBUMIN HUMAN 5 % IV SOLN
INTRAVENOUS | Status: DC | PRN
  Administered 2019-06-30 (×3): via INTRAVENOUS

## 2019-06-30 MED ORDER — LEVOTHYROXINE SODIUM 88 MCG PO TABS
88.0000 ug | ORAL_TABLET | Freq: Every day | ORAL | Status: DC
  Administered 2019-07-01 – 2019-07-05 (×5): 88 ug via ORAL
  Filled 2019-06-30 (×5): qty 1

## 2019-06-30 MED ORDER — FENTANYL CITRATE (PF) 250 MCG/5ML IJ SOLN
INTRAMUSCULAR | Status: DC | PRN
  Administered 2019-06-30 (×8): 50 ug via INTRAVENOUS

## 2019-06-30 MED ORDER — BUPIVACAINE HCL (PF) 0.5 % IJ SOLN
INTRAMUSCULAR | Status: AC
  Filled 2019-06-30: qty 30

## 2019-06-30 MED ORDER — NALOXONE HCL 0.4 MG/ML IJ SOLN: 0.4000 mg | INTRAMUSCULAR | Status: DC | PRN

## 2019-06-30 MED ORDER — PHENYLEPHRINE 40 MCG/ML (10ML) SYRINGE FOR IV PUSH (FOR BLOOD PRESSURE SUPPORT)
PREFILLED_SYRINGE | INTRAVENOUS | Status: DC | PRN
  Administered 2019-06-30: 80 ug via INTRAVENOUS

## 2019-06-30 MED ORDER — ACETAMINOPHEN 160 MG/5ML PO SOLN: 1000.0000 mg | Freq: Four times a day (QID) | ORAL | Status: DC

## 2019-06-30 MED ORDER — IPRATROPIUM-ALBUTEROL 0.5-2.5 (3) MG/3ML IN SOLN
3.0000 mL | Freq: Four times a day (QID) | RESPIRATORY_TRACT | Status: DC
  Administered 2019-06-30 – 2019-07-01 (×5): 3 mL via RESPIRATORY_TRACT
  Filled 2019-06-30 (×5): qty 3

## 2019-06-30 MED ORDER — DIPHENHYDRAMINE HCL 12.5 MG/5ML PO ELIX: 12.5000 mg | ORAL_SOLUTION | Freq: Four times a day (QID) | ORAL | Status: DC | PRN

## 2019-06-30 MED ORDER — DEXAMETHASONE SODIUM PHOSPHATE 10 MG/ML IJ SOLN
INTRAMUSCULAR | Status: DC | PRN
  Administered 2019-06-30: 10 mg via INTRAVENOUS

## 2019-06-30 MED ORDER — SODIUM CHLORIDE 0.9 % IV SOLN
INTRAVENOUS | Status: DC | PRN
  Administered 2019-06-30: 30 ug/min via INTRAVENOUS

## 2019-06-30 MED ORDER — SENNOSIDES-DOCUSATE SODIUM 8.6-50 MG PO TABS
1.0000 | ORAL_TABLET | Freq: Every day | ORAL | Status: DC
  Administered 2019-06-30 – 2019-07-04 (×5): 1 via ORAL
  Filled 2019-06-30 (×5): qty 1

## 2019-06-30 MED ORDER — MIDAZOLAM HCL 2 MG/2ML IJ SOLN
INTRAMUSCULAR | Status: AC
  Filled 2019-06-30: qty 2

## 2019-06-30 MED ORDER — HEMOSTATIC AGENTS (NO CHARGE) OPTIME
TOPICAL | Status: DC | PRN
  Administered 2019-06-30: 2 via TOPICAL

## 2019-06-30 MED ORDER — CHLORHEXIDINE GLUCONATE CLOTH 2 % EX PADS
6.0000 | MEDICATED_PAD | Freq: Every day | CUTANEOUS | Status: DC
  Administered 2019-06-30 – 2019-07-04 (×5): 6 via TOPICAL

## 2019-06-30 MED ORDER — HYDRALAZINE HCL 20 MG/ML IJ SOLN
10.0000 mg | Freq: Four times a day (QID) | INTRAMUSCULAR | Status: DC | PRN
  Administered 2019-06-30: 10 mg via INTRAVENOUS
  Filled 2019-06-30: qty 1

## 2019-06-30 MED ORDER — ATORVASTATIN CALCIUM 40 MG PO TABS
40.0000 mg | ORAL_TABLET | Freq: Every day | ORAL | Status: DC
  Administered 2019-06-30 – 2019-07-04 (×5): 40 mg via ORAL
  Filled 2019-06-30 (×5): qty 1

## 2019-06-30 MED ORDER — CEFAZOLIN SODIUM-DEXTROSE 2-4 GM/100ML-% IV SOLN
2.0000 g | INTRAVENOUS | Status: AC
  Administered 2019-06-30: 2 g via INTRAVENOUS

## 2019-06-30 MED ORDER — GABAPENTIN 100 MG PO CAPS
200.0000 mg | ORAL_CAPSULE | Freq: Every day | ORAL | Status: DC
  Administered 2019-06-30 – 2019-07-04 (×5): 200 mg via ORAL
  Filled 2019-06-30 (×5): qty 2

## 2019-06-30 MED ORDER — LIDOCAINE 2% (20 MG/ML) 5 ML SYRINGE
INTRAMUSCULAR | Status: AC
  Filled 2019-06-30: qty 5

## 2019-06-30 MED ORDER — BISACODYL 5 MG PO TBEC
10.0000 mg | DELAYED_RELEASE_TABLET | Freq: Every day | ORAL | Status: DC
  Administered 2019-07-01 – 2019-07-04 (×4): 10 mg via ORAL
  Filled 2019-06-30 (×5): qty 2

## 2019-06-30 MED ORDER — EPHEDRINE 5 MG/ML INJ
INTRAVENOUS | Status: AC
  Filled 2019-06-30: qty 10

## 2019-06-30 MED ORDER — ROCURONIUM BROMIDE 10 MG/ML (PF) SYRINGE
PREFILLED_SYRINGE | INTRAVENOUS | Status: AC
  Filled 2019-06-30: qty 10

## 2019-06-30 MED ORDER — INSULIN ASPART 100 UNIT/ML ~~LOC~~ SOLN
SUBCUTANEOUS | Status: AC
  Filled 2019-06-30: qty 1

## 2019-06-30 MED ORDER — IRBESARTAN 150 MG PO TABS
150.0000 mg | ORAL_TABLET | Freq: Every day | ORAL | Status: DC
  Administered 2019-07-01 – 2019-07-05 (×5): 150 mg via ORAL
  Filled 2019-06-30 (×5): qty 1

## 2019-06-30 MED ORDER — DIPHENHYDRAMINE HCL 50 MG/ML IJ SOLN: 12.5000 mg | Freq: Four times a day (QID) | INTRAMUSCULAR | Status: DC | PRN

## 2019-06-30 MED ORDER — CEFAZOLIN SODIUM-DEXTROSE 2-4 GM/100ML-% IV SOLN
2.0000 g | Freq: Three times a day (TID) | INTRAVENOUS | Status: AC
  Administered 2019-06-30 – 2019-07-01 (×2): 2 g via INTRAVENOUS
  Filled 2019-06-30 (×3): qty 100

## 2019-06-30 MED ORDER — ONDANSETRON HCL 4 MG/2ML IJ SOLN: 4.0000 mg | Freq: Once | INTRAMUSCULAR | Status: DC | PRN

## 2019-06-30 MED ORDER — FENTANYL CITRATE (PF) 100 MCG/2ML IJ SOLN
INTRAMUSCULAR | Status: AC
  Administered 2019-06-30: 25 ug via INTRAVENOUS
  Filled 2019-06-30: qty 2

## 2019-06-30 MED ORDER — SUGAMMADEX SODIUM 200 MG/2ML IV SOLN
INTRAVENOUS | Status: DC | PRN
  Administered 2019-06-30: 300 mg via INTRAVENOUS

## 2019-06-30 MED ORDER — CARVEDILOL 12.5 MG PO TABS
12.5000 mg | ORAL_TABLET | Freq: Two times a day (BID) | ORAL | Status: DC
  Administered 2019-07-01 – 2019-07-03 (×5): 12.5 mg via ORAL
  Filled 2019-06-30 (×5): qty 1

## 2019-06-30 MED ORDER — FENTANYL CITRATE (PF) 100 MCG/2ML IJ SOLN
25.0000 ug | INTRAMUSCULAR | Status: DC | PRN
  Administered 2019-06-30 (×2): 25 ug via INTRAVENOUS
  Administered 2019-06-30: 50 ug via INTRAVENOUS

## 2019-06-30 MED ORDER — SUCCINYLCHOLINE CHLORIDE 200 MG/10ML IV SOSY
PREFILLED_SYRINGE | INTRAVENOUS | Status: AC
  Filled 2019-06-30: qty 10

## 2019-06-30 SURGICAL SUPPLY — 129 items
APPLIER CLIP ROT 10 11.4 M/L (STAPLE)
BIT DRILL 7/64X5 DISP (BIT) IMPLANT
BLADE CLIPPER SURG (BLADE) ×2 IMPLANT
CANISTER SUCT 3000ML PPV (MISCELLANEOUS) ×8 IMPLANT
CATH KIT ON-Q SILVERSOAK 5 (CATHETERS) IMPLANT
CATH KIT ON-Q SILVERSOAK 5IN (CATHETERS) IMPLANT
CATH ROBINSON RED A/P 22FR (CATHETERS) IMPLANT
CATH THORACIC 28FR (CATHETERS) ×2 IMPLANT
CATH THORACIC 28FR RT ANG (CATHETERS) IMPLANT
CATH THORACIC 36FR (CATHETERS) IMPLANT
CATH THORACIC 36FR RT ANG (CATHETERS) IMPLANT
CLIP APPLIE ROT 10 11.4 M/L (STAPLE) IMPLANT
CLIP VESOCCLUDE MED 6/CT (CLIP) ×8 IMPLANT
CONN ST 1/4X3/8  BEN (MISCELLANEOUS) ×2
CONN ST 1/4X3/8 BEN (MISCELLANEOUS) IMPLANT
CONN Y 3/8X3/8X3/8  BEN (MISCELLANEOUS)
CONN Y 3/8X3/8X3/8 BEN (MISCELLANEOUS) IMPLANT
CONT SPEC 4OZ CLIKSEAL STRL BL (MISCELLANEOUS) ×24 IMPLANT
COVER SURGICAL LIGHT HANDLE (MISCELLANEOUS) IMPLANT
COVER WAND RF STERILE (DRAPES) ×2 IMPLANT
DERMABOND ADVANCED (GAUZE/BANDAGES/DRESSINGS)
DERMABOND ADVANCED .7 DNX12 (GAUZE/BANDAGES/DRESSINGS) IMPLANT
DRAIN CHANNEL 28F RND 3/8 FF (WOUND CARE) IMPLANT
DRAIN CHANNEL 32F RND 10.7 FF (WOUND CARE) IMPLANT
DRAPE CV SPLIT W-CLR ANES SCRN (DRAPES) ×4 IMPLANT
DRAPE LAPAROSCOPIC ABDOMINAL (DRAPES) ×4 IMPLANT
DRAPE ORTHO SPLIT 77X108 STRL (DRAPES) ×2
DRAPE SURG ORHT 6 SPLT 77X108 (DRAPES) ×2 IMPLANT
DRAPE WARM FLUID 44X44 (DRAPES) ×4 IMPLANT
ELECT BLADE 6.5 EXT (BLADE) ×4 IMPLANT
ELECT CAUTERY BLADE 6.4 (BLADE) ×2 IMPLANT
ELECT REM PT RETURN 9FT ADLT (ELECTROSURGICAL) ×4
ELECTRODE REM PT RTRN 9FT ADLT (ELECTROSURGICAL) ×2 IMPLANT
FELT TEFLON 1X6 (MISCELLANEOUS) ×2 IMPLANT
GAUZE SPONGE 4X4 12PLY STRL (GAUZE/BANDAGES/DRESSINGS) ×4 IMPLANT
GAUZE SPONGE 4X4 12PLY STRL LF (GAUZE/BANDAGES/DRESSINGS) ×2 IMPLANT
GLOVE BIO SURGEON STRL SZ7 (GLOVE) ×2 IMPLANT
GLOVE BIOGEL PI IND STRL 7.0 (GLOVE) IMPLANT
GLOVE BIOGEL PI INDICATOR 7.0 (GLOVE) ×2
GLOVE SURG SIGNA 7.5 PF LTX (GLOVE) ×16 IMPLANT
GOWN STRL REUS W/ TWL LRG LVL3 (GOWN DISPOSABLE) ×4 IMPLANT
GOWN STRL REUS W/ TWL XL LVL3 (GOWN DISPOSABLE) ×2 IMPLANT
GOWN STRL REUS W/TWL LRG LVL3 (GOWN DISPOSABLE) ×4
GOWN STRL REUS W/TWL XL LVL3 (GOWN DISPOSABLE) ×2
HEMOSTAT SURGICEL 2X14 (HEMOSTASIS) IMPLANT
INSERT FOGARTY 61MM (MISCELLANEOUS) ×2 IMPLANT
KIT BASIN OR (CUSTOM PROCEDURE TRAY) ×4 IMPLANT
KIT HEART LEFT (KITS) ×2 IMPLANT
KIT SUCTION CATH 14FR (SUCTIONS) ×4 IMPLANT
KIT TURNOVER KIT B (KITS) ×4 IMPLANT
NDL HYPO 25GX1X1/2 BEV (NEEDLE) ×2 IMPLANT
NDL SPNL 18GX3.5 QUINCKE PK (NEEDLE) ×2 IMPLANT
NEEDLE HYPO 25GX1X1/2 BEV (NEEDLE) ×4 IMPLANT
NEEDLE SPNL 18GX3.5 QUINCKE PK (NEEDLE) ×4 IMPLANT
NS IRRIG 1000ML POUR BTL (IV SOLUTION) ×12 IMPLANT
PACK CHEST (CUSTOM PROCEDURE TRAY) ×4 IMPLANT
PAD ARMBOARD 7.5X6 YLW CONV (MISCELLANEOUS) ×8 IMPLANT
POUCH ENDO CATCH II 15MM (MISCELLANEOUS) IMPLANT
POUCH SPECIMEN RETRIEVAL 10MM (ENDOMECHANICALS) IMPLANT
RELOAD STAPLE 35X2.5 WHT THIN (STAPLE) IMPLANT
RELOAD STAPLE 60 3.8 GOLD REG (STAPLE) IMPLANT
RELOAD STAPLE 60 4.1 GRN THCK (STAPLE) IMPLANT
RELOAD STAPLER GOLD 60MM (STAPLE) ×24 IMPLANT
RELOAD STAPLER GREEN 60MM (STAPLE) ×12 IMPLANT
SCISSORS LAP 5X35 DISP (ENDOMECHANICALS) IMPLANT
SEALANT PATCH FIBRIN 2X4IN (MISCELLANEOUS) ×4 IMPLANT
SEALANT PROGEL (MISCELLANEOUS) IMPLANT
SEALANT SURG COSEAL 4ML (VASCULAR PRODUCTS) IMPLANT
SEALANT SURG COSEAL 8ML (VASCULAR PRODUCTS) IMPLANT
SHEARS HARMONIC HDI 20CM (ELECTROSURGICAL) ×2 IMPLANT
SOL ANTI FOG 6CC (MISCELLANEOUS) ×2 IMPLANT
SOLUTION ANTI FOG 6CC (MISCELLANEOUS) ×2
SPECIMEN JAR LG PLASTIC EMPTY (MISCELLANEOUS) IMPLANT
SPECIMEN JAR MEDIUM (MISCELLANEOUS) ×4 IMPLANT
SPONGE INTESTINAL PEANUT (DISPOSABLE) ×12 IMPLANT
SPONGE TONSIL TAPE 1 RFD (DISPOSABLE) ×4 IMPLANT
STAPLE ECHEON FLEX 60 POW ENDO (STAPLE) ×2 IMPLANT
STAPLE RELOAD 2.5MM WHITE (STAPLE) ×32 IMPLANT
STAPLER RELOAD GOLD 60MM (STAPLE) ×48
STAPLER RELOAD GREEN 60MM (STAPLE) ×24
STAPLER VASCULAR ECHELON 35 (CUTTER) ×2 IMPLANT
STAPLER VISISTAT 35W (STAPLE) IMPLANT
SUT ETHIBOND 2 0 SH (SUTURE) ×2
SUT ETHIBOND 2 0 SH 36X2 (SUTURE) IMPLANT
SUT ETHIBOND 3-0 V-5 (SUTURE) ×4 IMPLANT
SUT PDS AB 3-0 SH 27 (SUTURE) IMPLANT
SUT PROLENE 4 0 RB 1 (SUTURE) ×14
SUT PROLENE 4 0 SH DA (SUTURE) IMPLANT
SUT PROLENE 4-0 RB1 .5 CRCL 36 (SUTURE) IMPLANT
SUT SILK  1 MH (SUTURE) ×4
SUT SILK 1 MH (SUTURE) ×4 IMPLANT
SUT SILK 1 TIES 10X30 (SUTURE) ×4 IMPLANT
SUT SILK 2 0 SH (SUTURE) IMPLANT
SUT SILK 2 0SH CR/8 30 (SUTURE) ×2 IMPLANT
SUT SILK 3 0 SH 30 (SUTURE) IMPLANT
SUT SILK 3 0SH CR/8 30 (SUTURE) ×4 IMPLANT
SUT VIC AB 1 CTX 27 (SUTURE) ×4 IMPLANT
SUT VIC AB 1 CTX 36 (SUTURE) ×2
SUT VIC AB 1 CTX36XBRD ANBCTR (SUTURE) ×2 IMPLANT
SUT VIC AB 2-0 CT1 27 (SUTURE)
SUT VIC AB 2-0 CT1 TAPERPNT 27 (SUTURE) IMPLANT
SUT VIC AB 2-0 CTX 36 (SUTURE) ×4 IMPLANT
SUT VIC AB 3-0 MH 27 (SUTURE) IMPLANT
SUT VIC AB 3-0 SH 18 (SUTURE) IMPLANT
SUT VIC AB 3-0 SH 27 (SUTURE) ×4
SUT VIC AB 3-0 SH 27X BRD (SUTURE) IMPLANT
SUT VIC AB 3-0 X1 27 (SUTURE) ×8 IMPLANT
SUT VICRYL 0 UR6 27IN ABS (SUTURE) ×4 IMPLANT
SUT VICRYL 2 TP 1 (SUTURE) ×2 IMPLANT
SWAB COLLECTION DEVICE MRSA (MISCELLANEOUS) IMPLANT
SWAB CULTURE ESWAB REG 1ML (MISCELLANEOUS) IMPLANT
SYR 10ML LL (SYRINGE) ×4 IMPLANT
SYR 20ML LL LF (SYRINGE) IMPLANT
SYR 30ML LL (SYRINGE) ×4 IMPLANT
SYR 50ML LL SCALE MARK (SYRINGE) ×2 IMPLANT
SYR CONTROL 10ML LL (SYRINGE) IMPLANT
SYSTEM SAHARA CHEST DRAIN ATS (WOUND CARE) ×4 IMPLANT
TAPE CLOTH 4X10 WHT NS (GAUZE/BANDAGES/DRESSINGS) ×4 IMPLANT
TAPE CLOTH SURG 4X10 WHT LF (GAUZE/BANDAGES/DRESSINGS) ×2 IMPLANT
TIP APPLICATOR SPRAY EXTEND 16 (VASCULAR PRODUCTS) IMPLANT
TOWEL GREEN STERILE (TOWEL DISPOSABLE) ×4 IMPLANT
TOWEL GREEN STERILE FF (TOWEL DISPOSABLE) ×4 IMPLANT
TRAP SPECIMEN MUCOUS 40CC (MISCELLANEOUS) IMPLANT
TRAY FOLEY SLVR 16FR LF STAT (SET/KITS/TRAYS/PACK) ×4 IMPLANT
TROCAR XCEL BLADELESS 5X75MML (TROCAR) ×4 IMPLANT
TROCAR XCEL NON-BLD 5MMX100MML (ENDOMECHANICALS) ×2 IMPLANT
TUBING EXTENTION W/L.L. (IV SETS) ×2 IMPLANT
TUNNELER SHEATH ON-Q 11GX8 DSP (PAIN MANAGEMENT) IMPLANT
WATER STERILE IRR 1000ML POUR (IV SOLUTION) ×6 IMPLANT

## 2019-06-30 NOTE — Transfer of Care (Signed)
Immediate Anesthesia Transfer of Care Note  Patient: Tonya Shelton  Procedure(s) Performed: Left VIDEO ASSISTED THORACOSCOPY (VATS) / left Thoracotomy with Left Pneumonectomy and node Dissection (Left Chest)  Patient Location: PACU  Anesthesia Type:General  Level of Consciousness: drowsy  Airway & Oxygen Therapy: Patient Spontanous Breathing and Patient connected to face mask oxygen  Post-op Assessment: Report given to RN and Post -op Vital signs reviewed and stable  Post vital signs: Reviewed and stable  Last Vitals:  Vitals Value Taken Time  BP 109/65 06/30/19 1409  Temp    Pulse 89 06/30/19 1414  Resp 16 06/30/19 1414  SpO2 100 % 06/30/19 1414  Vitals shown include unvalidated device data.  Last Pain:  Vitals:   06/30/19 0612  TempSrc:   PainSc: 0-No pain         Complications: No apparent anesthesia complications

## 2019-06-30 NOTE — Op Note (Signed)
NAME: Tonya Shelton, DAILY MEDICAL RECORD EX:[id number] ACCOUNT [id number] DATE OF BIRTH:[birth year] FACILITY: [facility] LOCATION: [facility] PHYSICIAN:Denicia Pagliarulo C. Letty Salvi, MD  OPERATIVE REPORT  DATE OF PROCEDURE:  06/30/2019  PREOPERATIVE DIAGNOSIS:  Squamous cell carcinoma, left upper lobe, stage IIB (T2, N1) status post neoadjuvant chemoradiation.  POSTOPERATIVE DIAGNOSIS:  Squamous cell carcinoma, left upper lobe, stage IIB (T2, N1) status post neoadjuvant chemoradiation.  PROCEDURE:   Left VATS with conversion to thoracotomy, Left pneumonectomy, Lymph node sampling and Intercostal nerve blocks levels 3 through 9.  SURGEON:  Modesto Charon, MD  ASSISTANT:  Enid Cutter, PA  ANESTHESIA:  General.  FINDINGS:  Incomplete fissure, node/scarring left upper lobe bronchus adjacent to the main pulmonary artery requiring pneumonectomy for complete resection.  Bronchial margin negative for tumor.  CLINICAL NOTE:  The patient is a 63 year old former smoker, who was diagnosed with a T2, N1, stage IIB squamous cell carcinoma of the left upper lobe earlier this year.  She has undergone neoadjuvant chemoradiation with a good partial response.  She now  presents for possible surgical resection.  The indications, risks, benefits, and alternatives were discussed in detail with the patient.  She understood there was no guarantee of a cure with surgical resection.  She understood and accepted the risks and  agreed to proceed.  OPERATIVE NOTE:  The patient was brought to the preoperative holding area on 06/30/2019.  Anesthesia placed a central venous catheter and an arterial blood pressure monitor line.  She was taken to the operating room, anesthetized and intubated with a  double lumen endotracheal tube.  Intravenous antibiotics were administered.  Sequential compression devices were placed on the calves for DVT prophylaxis.  Foley catheter was placed.  She was placed in a right lateral decubitus  position and the left  chest was prepped and draped in the usual sterile fashion.  Single-lung ventilation of the right lung was initiated and was tolerated well throughout the procedure.  A timeout was performed.  A solution containing 20 mL of liposomal bupivacaine, 30 mL of  0.5% bupivacaine and 50 mL of saline was prepared.  This was used for local at the skin incisions as well as for the intercostal nerve blocks.  The incision sites were injected prior to making incisions.    An incision was made in the seventh interspace in  the mid axillary line.  A 5 mm port was inserted.  The thoracoscope was advanced into the chest.  There was no pleural effusion and no abnormality of the parietal pleura.  There was some invagination of the visceral pleura in the lateral aspect of the  left upper lobe.  A 5 cm working incision was made in the 4th interspace anterolaterally.  10 ml of the bupivacaine solution was injected into a subpleural plane in each interspace from the 3rd to the 9th.  The Harmonic scalpel was used to divide the inferior pulmonary ligament and then the pleural reflection was divided at the hilum anteriorly and posteriorly.  The  visualization was limited due to the patient's body habitus.  It was felt that completing the major fissure anteriorly would help with visualization of the anterior hilar structures.  A second port incision was made anterior to the first.  This was  used for placement of staplers and also used for retraction later in the procedure.  An Echelon 60 mm stapler with gold cartridges was used to complete the fissure anteriorly.  There was significant scarring around the lingular branches of the  pulmonary artery.  The pleural reflection was divided at the hilum anteriorly and up into the aortopulmonary window and then superiorly.  The pulmonary vein branches were identified.  The lingular branch was encircled and divided with the vascular  stapler.  The remaining upper lobe  vein branches were then dissected out individually, encircled and divided.  There was some significant inflammatory reaction around this and the dissection was relatively slow, but this area all appeared free of any  tumor involvement.  Inspection of the pulmonary artery identified the anterior and apical branches to the upper lobe.  These arose in close proximity.  There was a segmental bronchus just behind these vessels, which made the dissection difficult. While  attempting to get around the anterior branch there was bleeding noted.  Pressure was applied for 10 minutes and there was no ongoing bleeding after that.  The dissection was carried back into the fissure and the major fissure was completed with  sequential firings of the Echelon stapler.  Posteriorly when attempting to dissect out the superior segmental branch and preserve it, bleeding was again encountered and this was quite vigorous bleeding.  Pressure was applied for 10 minutes initially and  then an additional 10 minutes after that before the bleeding was completely controlled.  There was significant blood loss when this bleeding initially occurred.  The patient remained hemodynamically stable, but due to the blood loss, she was  given 2 units of packed red blood cells.  The fissure was ultimately completed, but attempts to dissect out the posterior branch were again met with bleeding, which was controlled with direct pressure. Then, while attempting to dissect out the lingular  branches, it was clear that these would not be able to be dissected out nor would the main pulmonary artery to be able to be dissected off the left upper lobe bronchus.  The decision was made that a pneumonectomy would be necessary for complete  resection.  The main pulmonary artery was encircled and divided with the vascular stapler.  The inferior pulmonary vein was dissected out and it also was divided with the vascular stapler.  Finally, the Echelon stapler with a  green cartridge was  placed across the left main stem bronchus and closed.  The stapler was fired, transecting the bronchus.  The left lung was removed and sent for frozen section of the bronchial margin, which subsequently returned negative for tumor.  The chest was  copiously irrigated with warm saline.  A test inflation to 30 cm of water revealed no leakage from the bronchial stump.  The bronchial stump was relatively well surrounded by vascular structures, but a pleural flap was created and placed over the bronchial  stump.  There was good hemostasis at all the staple lines.  A 28-French chest tube was placed through the anterior port incision and secured with a #1 silk suture.  The scope was removed.  The posterior port incision was closed with a 3-0 Vicryl  subcuticular suture.  The thoracotomy incision was closed with #2 Vicryl pericostal sutures.  The muscular layers were closed with #1 Vicryl running sutures.  The subcutaneous tissue and skin were closed in standard fashion.  The chest tube was clamped.   Dermabond was applied to the incision.  The patient was placed back in supine position.  She was extubated in the operating room and taken to the [facility] [facility] in good condition.  TN/NUANCE  D:06/30/2019 T:06/30/2019 JOB:[id number]

## 2019-06-30 NOTE — Anesthesia Procedure Notes (Signed)
Arterial Line Insertion Start/End9/01/2019 6:53 AM Performed by: Roberts Gaudy, MD, Julieta Bellini, CRNA, CRNA  Patient location: Pre-op. Preanesthetic checklist: patient identified, IV checked, site marked, risks and benefits discussed, surgical consent, monitors and equipment checked, pre-op evaluation, timeout performed and anesthesia consent Lidocaine 1% used for infiltration Right, radial was placed Catheter size: 20 Fr Hand hygiene performed  and maximum sterile barriers used   Attempts: 1 Procedure performed without using ultrasound guided technique. Following insertion, dressing applied and Biopatch. Post procedure assessment: normal and unchanged  Patient tolerated the procedure well with no immediate complications.

## 2019-06-30 NOTE — Anesthesia Procedure Notes (Signed)
Central Venous Catheter Insertion Performed by: Roberts Gaudy, MD, anesthesiologist Start/End9/01/2019 6:50 AM, 06/30/2019 7:00 AM Patient location: Pre-op. Preanesthetic checklist: patient identified, IV checked, site marked, risks and benefits discussed, surgical consent, monitors and equipment checked, pre-op evaluation, timeout performed and anesthesia consent Lidocaine 1% used for infiltration and patient sedated Hand hygiene performed  and maximum sterile barriers used  Catheter size: 8 Fr Total catheter length 16. Central line was placed.Double lumen Procedure performed using ultrasound guided technique. Ultrasound Notes:anatomy identified, needle tip was noted to be adjacent to the nerve/plexus identified, no ultrasound evidence of intravascular and/or intraneural injection and image(s) printed for medical record Attempts: 1 Following insertion, dressing applied and line sutured. Post procedure assessment: blood return through all ports  Patient tolerated the procedure well with no immediate complications.

## 2019-06-30 NOTE — Anesthesia Procedure Notes (Signed)
Procedure Name: Intubation Date/Time: 06/30/2019 8:17 AM Performed by: Julieta Bellini, CRNA Pre-anesthesia Checklist: Patient identified, Emergency Drugs available, Suction available and Patient being monitored Patient Re-evaluated:Patient Re-evaluated prior to induction Oxygen Delivery Method: Circle system utilized Preoxygenation: Pre-oxygenation with 100% oxygen Induction Type: IV induction Ventilation: Mask ventilation without difficulty and Oral airway inserted - appropriate to patient size Laryngoscope Size: Mac and 3 Grade View: Grade I Tube type: Oral Endobronchial tube: Left, EBT position confirmed by auscultation, Double lumen EBT and EBT position confirmed by fiberoptic bronchoscope and 35 Fr Number of attempts: 1 Airway Equipment and Method: Stylet Placement Confirmation: ETT inserted through vocal cords under direct vision,  positive ETCO2 and breath sounds checked- equal and bilateral Secured at: 25 cm Tube secured with: Tape Dental Injury: Teeth and Oropharynx as per pre-operative assessment

## 2019-06-30 NOTE — Brief Op Note (Addendum)
06/30/2019  1:56 PM  PATIENT:  Tonya Shelton  63 y.o. female  PRE-OPERATIVE DIAGNOSIS:  Squamous cell carcinoma left upper lobe  POST-OPERATIVE DIAGNOSIS:  Squamous cell carcinoma left upper lobe  PROCEDURE:  LEFT VATS, CONVERSION TO THORACOTOMY LEFT PNEUMONECTOMY LYMPH NODE SAMPLING  SURGEON:  Surgeon(s) and Role:    * [name], MD - Primary  PHYSICIAN ASSISTANT: Roddenberry   ANESTHESIA:   general  EBL:  1400 mL   BLOOD ADMINISTERED: 2 units PRBC's  DRAINS: 25fr pleural tube x 1   LOCAL MEDICATIONS USED:  BUPIVICAINE   SPECIMEN:  Source of Specimen:  Left lung, multiple lymph nodes  DISPOSITION OF SPECIMEN:  PATHOLOGY  COUNTS:  YES  DICTATION: .Dragon Dictation  PLAN OF CARE: Admit to inpatient   PATIENT DISPOSITION:  PACU - hemodynamically stable.   Delay start of Pharmacological VTE agent (>24hrs) due to surgical blood loss or risk of bleeding: no

## 2019-06-30 NOTE — Interval H&P Note (Signed)
History and Physical Interval Note:  06/30/2019 7:14 AM  Tonya Shelton  has presented today for surgery, with the diagnosis of Squamous cell carcinoma left upper lobe.  The various methods of treatment have been discussed with the patient and family. After consideration of risks, benefits and other options for treatment, the patient has consented to  Procedure(s): VIDEO ASSISTED THORACOSCOPY (VATS) / left upper LOBECTOMY (Left) possible,THORACOTOMY MAJOR (Left) as a surgical intervention.  The patient's history has been reviewed, patient examined, no change in status, stable for surgery.  I have reviewed the patient's chart and labs.  Questions were answered to the patient's satisfaction.     [name]

## 2019-06-30 NOTE — Anesthesia Postprocedure Evaluation (Signed)
Anesthesia Post Note  Patient: Tonya Shelton  Procedure(s) Performed: Left VIDEO ASSISTED THORACOSCOPY (VATS) / left Thoracotomy with Left Pneumonectomy and node Dissection (Left Chest)     Patient location during evaluation: PACU Anesthesia Type: General Level of consciousness: awake and alert Pain management: pain level controlled Vital Signs Assessment: post-procedure vital signs reviewed and stable Respiratory status: spontaneous breathing, nonlabored ventilation, respiratory function stable and patient connected to nasal cannula oxygen Cardiovascular status: blood pressure returned to baseline and stable Postop Assessment: no apparent nausea or vomiting Anesthetic complications: no    Last Vitals:  Vitals:   06/30/19 1509 06/30/19 1512  BP: 124/69   Pulse: 79 81  Resp: 11 12  Temp:  36.7 C  SpO2: 100% 98%    Last Pain:  Vitals:   06/30/19 1512  TempSrc:   PainSc: 8                  Kacy Conely COKER

## 2019-07-01 ENCOUNTER — Inpatient Hospital Stay (HOSPITAL_COMMUNITY): Payer: Medicare Other

## 2019-07-01 ENCOUNTER — Encounter (HOSPITAL_COMMUNITY): Payer: Self-pay | Admitting: Thoracic Surgery (Cardiothoracic Vascular Surgery)

## 2019-07-01 LAB — POCT I-STAT 7, (LYTES, BLD GAS, ICA,H+H)
Acid-base deficit: 2 mmol/L (ref 0.0–2.0)
Bicarbonate: 24.5 mmol/L (ref 20.0–28.0)
Bicarbonate: 25.2 mmol/L (ref 20.0–28.0)
Calcium, Ion: 1.16 mmol/L (ref 1.15–1.40)
Calcium, Ion: 1.14 mmol/L — ABNORMAL LOW (ref 1.15–1.40)
HCT: 28 % — ABNORMAL LOW (ref 36.0–46.0)
HCT: 29 % — ABNORMAL LOW (ref 36.0–46.0)
Hemoglobin: 9.5 g/dL — ABNORMAL LOW (ref 12.0–15.0)
Hemoglobin: 9.9 g/dL — ABNORMAL LOW (ref 12.0–15.0)
O2 Saturation: 99 %
O2 Saturation: 99 %
Patient temperature: 98.6
Potassium: 4 mmol/L (ref 3.5–5.1)
Potassium: 3.6 mmol/L (ref 3.5–5.1)
Sodium: 140 mmol/L (ref 135–145)
Sodium: 139 mmol/L (ref 135–145)
TCO2: 26 mmol/L (ref 22–32)
TCO2: 26 mmol/L (ref 22–32)
pCO2 arterial: 50.2 mmHg — ABNORMAL HIGH (ref 32.0–48.0)
pCO2 arterial: 42.5 mmHg (ref 32.0–48.0)
pH, Arterial: 7.297 — ABNORMAL LOW (ref 7.350–7.450)
pH, Arterial: 7.381 (ref 7.350–7.450)
pO2, Arterial: 171 mmHg — ABNORMAL HIGH (ref 83.0–108.0)
pO2, Arterial: 121 mmHg — ABNORMAL HIGH (ref 83.0–108.0)

## 2019-07-01 LAB — CBC
HCT: 32.6 % — ABNORMAL LOW (ref 36.0–46.0)
HCT: 29.8 % — ABNORMAL LOW (ref 36.0–46.0)
Hemoglobin: 10.8 g/dL — ABNORMAL LOW (ref 12.0–15.0)
Hemoglobin: 9.6 g/dL — ABNORMAL LOW (ref 12.0–15.0)
MCH: 34.3 pg — ABNORMAL HIGH (ref 26.0–34.0)
MCH: 34.3 pg — ABNORMAL HIGH (ref 26.0–34.0)
MCHC: 33.1 g/dL (ref 30.0–36.0)
MCHC: 32.2 g/dL (ref 30.0–36.0)
MCV: 103.5 fL — ABNORMAL HIGH (ref 80.0–100.0)
MCV: 106.4 fL — ABNORMAL HIGH (ref 80.0–100.0)
Platelets: 251 10*3/uL (ref 150–400)
Platelets: 218 10*3/uL (ref 150–400)
RBC: 3.15 MIL/uL — ABNORMAL LOW (ref 3.87–5.11)
RBC: 2.8 MIL/uL — ABNORMAL LOW (ref 3.87–5.11)
RDW: 18.9 % — ABNORMAL HIGH (ref 11.5–15.5)
RDW: 18.6 % — ABNORMAL HIGH (ref 11.5–15.5)
WBC: 14.6 10*3/uL — ABNORMAL HIGH (ref 4.0–10.5)
WBC: 13.8 10*3/uL — ABNORMAL HIGH (ref 4.0–10.5)
nRBC: 0 % (ref 0.0–0.2)
nRBC: 0 % (ref 0.0–0.2)

## 2019-07-01 LAB — BASIC METABOLIC PANEL
Anion gap: 10 (ref 5–15)
BUN: 8 mg/dL (ref 8–23)
CO2: 22 mmol/L (ref 22–32)
Calcium: 7.6 mg/dL — ABNORMAL LOW (ref 8.9–10.3)
Chloride: 107 mmol/L (ref 98–111)
Creatinine, Ser: 0.97 mg/dL (ref 0.44–1.00)
GFR calc Af Amer: >60 mL/min (ref >60)
GFR calc non Af Amer: >60 mL/min (ref >60)
Glucose, Bld: 132 mg/dL — ABNORMAL HIGH (ref 70–99)
Potassium: 3.7 mmol/L (ref 3.5–5.1)
Sodium: 139 mmol/L (ref 135–145)

## 2019-07-01 LAB — GLUCOSE, CAPILLARY
Glucose-Capillary: 110 mg/dL — ABNORMAL HIGH (ref 70–99)
Glucose-Capillary: 127 mg/dL — ABNORMAL HIGH (ref 70–99)
Glucose-Capillary: 131 mg/dL — ABNORMAL HIGH (ref 70–99)
Glucose-Capillary: 132 mg/dL — ABNORMAL HIGH (ref 70–99)
Glucose-Capillary: 162 mg/dL — ABNORMAL HIGH (ref 70–99)
Glucose-Capillary: 177 mg/dL — ABNORMAL HIGH (ref 70–99)
Glucose-Capillary: 191 mg/dL — ABNORMAL HIGH (ref 70–99)

## 2019-07-01 MED ORDER — IPRATROPIUM-ALBUTEROL 0.5-2.5 (3) MG/3ML IN SOLN
3.0000 mL | Freq: Two times a day (BID) | RESPIRATORY_TRACT | Status: DC
  Administered 2019-07-02 – 2019-07-04 (×5): 3 mL via RESPIRATORY_TRACT
  Filled 2019-07-01 (×5): qty 3

## 2019-07-01 MED ORDER — SODIUM CHLORIDE 0.9% FLUSH
10.0000 mL | Freq: Two times a day (BID) | INTRAVENOUS | Status: DC
  Administered 2019-07-01 – 2019-07-03 (×5): 10 mL

## 2019-07-01 MED ORDER — POTASSIUM CHLORIDE CRYS ER 20 MEQ PO TBCR
20.0000 meq | EXTENDED_RELEASE_TABLET | ORAL | Status: AC
  Administered 2019-07-01 (×3): 20 meq via ORAL
  Filled 2019-07-01 (×3): qty 1

## 2019-07-01 MED ORDER — FUROSEMIDE 10 MG/ML IJ SOLN
20.0000 mg | Freq: Two times a day (BID) | INTRAMUSCULAR | Status: DC
  Administered 2019-07-01: 20 mg via INTRAVENOUS
  Filled 2019-07-01 (×2): qty 2

## 2019-07-01 MED ORDER — SODIUM CHLORIDE 0.9% FLUSH: 10.0000 mL | INTRAVENOUS | Status: DC | PRN

## 2019-07-01 NOTE — Progress Notes (Addendum)
      [address]       [place] [address]             [phone]      1 Day Post-Op Procedure(s) (LRB): Left VIDEO ASSISTED THORACOSCOPY (VATS) / left Thoracotomy with Left Pneumonectomy and node Dissection (Left)   Subjective:  Up in chair, having some pain, responds well to pain medication  Objective: Vital signs in last 24 hours: Temp:  [98 F (36.7 C)-98.7 F (37.1 C)] 98 F (36.7 C) (09/05 1531) Pulse Rate:  [78-98] 98 (09/05 0400) Cardiac Rhythm: Sinus tachycardia (09/05 0800) Resp:  [12-24] 22 (09/05 1300) BP: (92-148)/(52-81) 92/52 (09/05 1300) SpO2:  [87 %-100 %] 99 % (09/05 1300) Arterial Line BP: (81-191)/(52-97) 97/55 (09/05 1300) Weight:  [97.6 kg] 97.6 kg (09/04 1857)  Intake/Output from previous day: 09/04 0701 - 09/05 0700 In: 5023.5 [P.O.:120; I.V.:3317.9; Blood:630; IV Piggyback:955.5] Out: 2755 [Urine:1355; Blood:1400] Intake/Output this shift: Total I/O In: 706.5 [P.O.:510; I.V.:196.5] Out: 760 [Urine:760]  General appearance: alert, cooperative and no distress Heart: regular rate and rhythm Lungs: CTA on right, absent on left Abdomen: soft, non-tender; bowel sounds normal; no masses,  no organomegaly Wound: clean and dry  Lab Results: Recent Labs    07/01/19 0407 07/01/19 0415  WBC 14.6*  --   HGB 10.8* 9.9*  HCT 32.6* 29.0*  PLT 251  --    BMET:  Recent Labs    07/01/19 0407 07/01/19 0415  NA 139 139  K 3.7 3.6  CL 107  --   CO2 22  --   GLUCOSE 132*  --   BUN 8  --   CREATININE 0.97  --   CALCIUM 7.6*  --     PT/INR: No results for input(s): LABPROT, INR in the last 72 hours. ABG    Component Value Date/Time   PHART 7.381 07/01/2019 0415   HCO3 25.2 07/01/2019 0415   TCO2 26 07/01/2019 0415   ACIDBASEDEF 2.0 06/30/2019 1154   O2SAT 99.0 07/01/2019 0415   CBG (last 3)  Recent Labs    07/01/19 0739 07/01/19 1128 07/01/19 1528  GLUCAP 177* 162* 110*    Assessment/Plan: S/P Procedure(s)  (LRB): Left VIDEO ASSISTED THORACOSCOPY (VATS) / left Thoracotomy with Left Pneumonectomy and node Dissection (Left)  1. CV- NSR, BP is labile in the 90s 2. Pulm- CXR with opacification on left due to fluid accumulation, CT was removed this morning, will repeat CXR in AM 3. Expected post operative blood loss, mild 4. D/c arterial line 5. IV fluids to KVO 6. Lovenox for DVT prophylaxis 7. dispo- patient stable, chest tube removed without difficulty, repeat CXR in AM   LOS: 1 day    Erin Barrett 07/01/2019  Breathing comfortably, a.m. chest x-ray shows no shift with clear right lung Right pleural tube removed Maintaining sinus rhythm Blood pressure soft with diuresis.  Recheck CBC this evening  patient examined and medical record reviewed,agree with above note. Tharon Aquas Trigt III 07/01/2019

## 2019-07-02 ENCOUNTER — Inpatient Hospital Stay (HOSPITAL_COMMUNITY): Payer: Medicare Other

## 2019-07-02 DIAGNOSIS — Z902 Acquired absence of lung [part of]: Secondary | ICD-10-CM

## 2019-07-02 LAB — BPAM RBC
Blood Product Expiration Date: 202010092359
Blood Product Expiration Date: 202010092359
Blood Product Expiration Date: 202010092359
Blood Product Expiration Date: 202010092359
Blood Product Expiration Date: 202010102359
Blood Product Expiration Date: 202010102359
ISSUE DATE / TIME: 202009040939
ISSUE DATE / TIME: 202009041021
ISSUE DATE / TIME: 202009041021
ISSUE DATE / TIME: 202009041228
ISSUE DATE / TIME: 202009041228
Unit Type and Rh: 5100
Unit Type and Rh: 5100
Unit Type and Rh: 5100
Unit Type and Rh: 5100
Unit Type and Rh: 5100
Unit Type and Rh: 5100

## 2019-07-02 LAB — TYPE AND SCREEN
ABO/RH(D): O POS
Antibody Screen: NEGATIVE
Unit division: 0
Unit division: 0
Unit division: 0
Unit division: 0
Unit division: 0
Unit division: 0

## 2019-07-02 LAB — CBC
HCT: 29.6 % — ABNORMAL LOW (ref 36.0–46.0)
HCT: 30.1 % — ABNORMAL LOW (ref 36.0–46.0)
Hemoglobin: 9.8 g/dL — ABNORMAL LOW (ref 12.0–15.0)
Hemoglobin: 9.9 g/dL — ABNORMAL LOW (ref 12.0–15.0)
MCH: 34.5 pg — ABNORMAL HIGH (ref 26.0–34.0)
MCH: 34.5 pg — ABNORMAL HIGH (ref 26.0–34.0)
MCHC: 33.1 g/dL (ref 30.0–36.0)
MCHC: 32.9 g/dL (ref 30.0–36.0)
MCV: 104.2 fL — ABNORMAL HIGH (ref 80.0–100.0)
MCV: 104.9 fL — ABNORMAL HIGH (ref 80.0–100.0)
Platelets: 192 10*3/uL (ref 150–400)
Platelets: 214 10*3/uL (ref 150–400)
RBC: 2.84 MIL/uL — ABNORMAL LOW (ref 3.87–5.11)
RBC: 2.87 MIL/uL — ABNORMAL LOW (ref 3.87–5.11)
RDW: 18.4 % — ABNORMAL HIGH (ref 11.5–15.5)
RDW: 18.4 % — ABNORMAL HIGH (ref 11.5–15.5)
WBC: 11.9 10*3/uL — ABNORMAL HIGH (ref 4.0–10.5)
WBC: 13 10*3/uL — ABNORMAL HIGH (ref 4.0–10.5)
nRBC: 0 % (ref 0.0–0.2)
nRBC: 0 % (ref 0.0–0.2)

## 2019-07-02 LAB — COMPREHENSIVE METABOLIC PANEL
ALT: 12 U/L (ref 0–44)
ALT: 19 U/L (ref 0–44)
AST: 17 U/L (ref 15–41)
AST: 30 U/L (ref 15–41)
Albumin: 1.4 g/dL — ABNORMAL LOW (ref 3.5–5.0)
Albumin: 2.7 g/dL — ABNORMAL LOW (ref 3.5–5.0)
Alkaline Phosphatase: 36 U/L — ABNORMAL LOW (ref 38–126)
Alkaline Phosphatase: 61 U/L (ref 38–126)
Anion gap: 4 — ABNORMAL LOW (ref 5–15)
Anion gap: 10 (ref 5–15)
BUN: 6 mg/dL — ABNORMAL LOW (ref 8–23)
BUN: 9 mg/dL (ref 8–23)
CO2: 15 mmol/L — ABNORMAL LOW (ref 22–32)
CO2: 23 mmol/L (ref 22–32)
Calcium: 4.2 mg/dL — CL (ref 8.9–10.3)
Calcium: 7.4 mg/dL — ABNORMAL LOW (ref 8.9–10.3)
Chloride: 123 mmol/L — ABNORMAL HIGH (ref 98–111)
Chloride: 103 mmol/L (ref 98–111)
Creatinine, Ser: 0.5 mg/dL (ref 0.44–1.00)
Creatinine, Ser: 0.93 mg/dL (ref 0.44–1.00)
GFR calc Af Amer: >60 mL/min (ref >60)
GFR calc Af Amer: >60 mL/min (ref >60)
GFR calc non Af Amer: >60 mL/min (ref >60)
GFR calc non Af Amer: >60 mL/min (ref >60)
Glucose, Bld: 80 mg/dL (ref 70–99)
Glucose, Bld: 137 mg/dL — ABNORMAL HIGH (ref 70–99)
Potassium: 2.1 mmol/L — CL (ref 3.5–5.1)
Potassium: 3.7 mmol/L (ref 3.5–5.1)
Sodium: 142 mmol/L (ref 135–145)
Sodium: 136 mmol/L (ref 135–145)
Total Bilirubin: 0.4 mg/dL (ref 0.3–1.2)
Total Bilirubin: 0.7 mg/dL (ref 0.3–1.2)
Total Protein: <3 g/dL — ABNORMAL LOW (ref 6.5–8.1)
Total Protein: 5.3 g/dL — ABNORMAL LOW (ref 6.5–8.1)

## 2019-07-02 LAB — POCT I-STAT 7, (LYTES, BLD GAS, ICA,H+H)
Acid-base deficit: 1 mmol/L (ref 0.0–2.0)
Bicarbonate: 23.3 mmol/L (ref 20.0–28.0)
Calcium, Ion: 1.07 mmol/L — ABNORMAL LOW (ref 1.15–1.40)
HCT: 28 % — ABNORMAL LOW (ref 36.0–46.0)
Hemoglobin: 9.5 g/dL — ABNORMAL LOW (ref 12.0–15.0)
O2 Saturation: 96 %
Potassium: 3.8 mmol/L (ref 3.5–5.1)
Sodium: 137 mmol/L (ref 135–145)
TCO2: 24 mmol/L (ref 22–32)
pCO2 arterial: 37 mmHg (ref 32.0–48.0)
pH, Arterial: 7.407 (ref 7.350–7.450)
pO2, Arterial: 83 mmHg (ref 83.0–108.0)

## 2019-07-02 LAB — GLUCOSE, CAPILLARY
Glucose-Capillary: 102 mg/dL — ABNORMAL HIGH (ref 70–99)
Glucose-Capillary: 155 mg/dL — ABNORMAL HIGH (ref 70–99)
Glucose-Capillary: 135 mg/dL — ABNORMAL HIGH (ref 70–99)
Glucose-Capillary: 134 mg/dL — ABNORMAL HIGH (ref 70–99)
Glucose-Capillary: 135 mg/dL — ABNORMAL HIGH (ref 70–99)

## 2019-07-02 NOTE — Discharge Instructions (Signed)
Discharge Instructions:  1. You may shower, please wash incisions daily with soap and water and keep dry.  If you wish to cover wounds with dressing you may do so but please keep clean and change daily.  No tub baths or swimming until incisions have completely healed.  If your incisions become red or develop any drainage please call our office at [phone]  2. No Driving until cleared by Dr. Hendrickson's office and you are no longer using narcotic pain medications  3. Fever of 101.5 for at least 24 hours with no source, please contact our office at [phone]  4. Activity- up as tolerated, please walk at least 3 times per day.  Avoid strenuous activity, no lifting, pushing, or pulling with your arms over 8-10 lbs for a minimum of 6 weeks  5. If any questions or concerns arise, please do not hesitate to contact our office at [phone]  

## 2019-07-02 NOTE — Progress Notes (Signed)
2 Days Post-Op Procedure(s) (LRB): Left VIDEO ASSISTED THORACOSCOPY (VATS) / left Thoracotomy with Left Pneumonectomy and node Dissection (Left) Subjective: Doing well on room air Trachea midline Chest x-ray with clear right lung, left pleural space filling with fluid as expected Sinus rhythm We will take her first walk in the hallway today-leave in ICU for observation.   DC Foley  Objective: Vital signs in last 24 hours: Temp:  [98 F (36.7 C)-99 F (37.2 C)] 98.9 F (37.2 C) (09/06 0745) Cardiac Rhythm: Sinus tachycardia (09/06 0800) Resp:  [11-26] 16 (09/06 0800) BP: (83-127)/(36-108) 100/71 (09/06 0800) SpO2:  [96 %-100 %] 98 % (09/06 0850) Arterial Line BP: (97-110)/(52-58) 97/55 (09/05 1300) Weight:  [95.7 kg] 95.7 kg (09/06 0600)  Hemodynamic parameters for last 24 hours:  Sinus rhythm  Intake/Output from previous day: 09/05 0701 - 09/06 0700 In: 975.9 [P.O.:620; I.V.:355.9] Out: 1560 [Urine:1560] Intake/Output this shift: Total I/O In: 9.7 [I.V.:9.7] Out: -   Trachea midline Right lung clear Thoracotomy incision clean and dry  Lab Results: Recent Labs    07/02/19 0454 07/02/19 0605  WBC 11.9* 13.0*  HGB 9.8* 9.9*  HCT 29.6* 30.1*  PLT 192 214   BMET:  Recent Labs    07/02/19 0454 07/02/19 0605  NA 142 136  K 2.1* 3.7  CL 123* 103  CO2 15* 23  GLUCOSE 80 137*  BUN 6* 9  CREATININE 0.50 0.93  CALCIUM 4.2* 7.4*    PT/INR: No results for input(s): LABPROT, INR in the last 72 hours. ABG    Component Value Date/Time   PHART 7.407 07/02/2019 0437   HCO3 23.3 07/02/2019 0437   TCO2 24 07/02/2019 0437   ACIDBASEDEF 1.0 07/02/2019 0437   O2SAT 96.0 07/02/2019 0437   CBG (last 3)  Recent Labs    07/01/19 2315 07/02/19 0309 07/02/19 0741  GLUCAP 127* 102* 155*    Assessment/Plan: S/P Procedure(s) (LRB): Left VIDEO ASSISTED THORACOSCOPY (VATS) / left Thoracotomy with Left Pneumonectomy and node Dissection (Left) Blood pressure better,  weight at baseline In ICU for another day   LOS: 2 days    Tonya Shelton 07/02/2019

## 2019-07-02 NOTE — Plan of Care (Signed)
  Problem: Education: Goal: Knowledge of General Education information will improve Description: Including pain rating scale, medication(s)/side effects and non-pharmacologic comfort measures Outcome: Progressing   Problem: Health Behavior/Discharge Planning: Goal: Ability to manage health-related needs will improve Outcome: Progressing   Problem: Clinical Measurements: Goal: Ability to maintain clinical measurements within normal limits will improve Outcome: Progressing Goal: Will remain free from infection Outcome: Progressing Goal: Diagnostic test results will improve Outcome: Progressing Goal: Respiratory complications will improve Outcome: Progressing-pt on room air Goal: Cardiovascular complication will be avoided Outcome: Progressing-maintaing NSR & adequate BP   Problem: Activity: Goal: Risk for activity intolerance will decrease Outcome: Progressing-ambulating well in hallway   Problem: Nutrition: Goal: Adequate nutrition will be maintained Outcome: Progressing-tolerating carb mod diet   Problem: Coping: Goal: Level of anxiety will decrease Outcome: Progressing   Problem: Elimination: Goal: Will not experience complications related to bowel motility Outcome: Progressing-pt beginning to pass gas  Goal: Will not experience complications related to urinary retention Outcome: Progressing-will DC foley today   Problem: Pain Managment: Goal: General experience of comfort will improve Outcome: Progressing-achieving adequate pain control from PO pain meds and using PCA less frequently   Problem: Safety: Goal: Ability to remain free from injury will improve Outcome: Progressing   Problem: Skin Integrity: Goal: Risk for impaired skin integrity will decrease Outcome: Progressing

## 2019-07-02 NOTE — Discharge Summary (Addendum)
Physician Discharge Summary  Patient ID: Tonya Shelton MRN: [id number] DOB/AGE: 65-Oct-1957 65 y.o.  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 Neck pain 05/01/2019  . Port-A-Cath in place 04/03/2019  . Stage II squamous cell carcinoma of left lung ([place]) 02/16/2019  . Goals of care, counseling/discussion 02/16/2019  . Encounter for antineoplastic chemotherapy 02/16/2019  . Vitamin B12 deficiency 01/31/2019  . Former smoker 01/23/2019  . DOE (dyspnea on exertion) 01/18/2019  . Mass of left lung 01/17/2019  . Lumbar stenosis 10/15/2017  . Protrusion of lumbar intervertebral disc 05/20/2017  . Diabetes mellitus ([place]) 03/27/2016  . Hyperlipidemia, unspecified 07/29/2010  . Hypokalemia 01/29/2010  . Essential hypertension, benign 04/18/2008  . Tobacco use disorder 01/03/2008  . Esophageal reflux 08/16/2007   Discharge Diagnoses:  Stage IIB (T2N1) squamous cell carcinoma left upper lobe. Post treatment stage 0 Adenocarcinoma left upper lobe- Clinical occult, pathologic ypT1N0, stage IA  Patient Active Problem List   Diagnosis Date Noted  . S/P pneumonectomy 07/02/2019  . S/P thoracotomy 06/30/2019  . Neck pain 05/01/2019  . Port-A-Cath in place 04/03/2019  . Stage II squamous cell carcinoma of left lung ([place]) 02/16/2019  . Goals of care, counseling/discussion 02/16/2019  . Encounter for antineoplastic chemotherapy 02/16/2019  . Vitamin B12 deficiency 01/31/2019  . Former smoker 01/23/2019  . DOE (dyspnea on exertion) 01/18/2019  . Mass of left lung 01/17/2019  . Lumbar stenosis 10/15/2017  . Protrusion of lumbar intervertebral disc 05/20/2017  . Diabetes mellitus ([place]) 03/27/2016  . Hyperlipidemia, unspecified 07/29/2010  . Hypokalemia 01/29/2010  . Essential hypertension, benign 04/18/2008  .  Tobacco use disorder 01/03/2008  . Esophageal reflux 08/16/2007   Discharged Condition: good  History of Present Illness:  Tonya Shelton is a 63 yo white obese female with known history of tobacco abuse (quit in 2014), Hypertension, Depression, Type 2 DM, Stage 3 CKD, Neuropathy, reflux, and PVCs.  The patient was diagnsed with Stage IIB, T2, N1 squamous cell carcinoma of the left upper lobe earlier this year.  She was treated with neoadjuvant chemoradiation with carboplatin and paclitaxel.  Follow up CT scan showed excellent partial response.  Her chemotherapy completed in May 01, 2019. It was felt she would not be candidate for surgical resection and she was referred to [facility].  She was evaluated by Dr. Roxan Hockey at which time she denies chest pain, shortness of breath, and significant weight loss.  She was offered surgical lobectomy, but it was stressed that this is not a guaranteed cure, but it does increase her chances.  The risks and benefits of the procedure were explained to the patient and she was agreeable to proceed.   Hospital Course:   Tonya Shelton presented to [facility] on 06/30[facility].  She was taken to the operating room and underwent Left VATS with conversion to Thoracotomy with Left pneumonectomy, lymph node sampling, and intercostal nerve blocks.  She tolerated the procedure without difficulty, was extubated and taken to the PACU in stable condition.  The patient has done well post operatively.  Her arterial line was removed without difficulty. Her foley catheter was removed. Post operative CXR showed no evidence of shift with a clear right lung.  There was collection of pleural fluid on left as expected.  Her chest tube was removed without difficulty.  Follow up CXR remained stable, with  right lung remaining clear, and continued increase in left pleural fluid.   She was ambulating in the hallways without difficulty. She is  requiring oxygen. Central line was removed on 09/08.  She was resumed on her blood pressure medications as taken prior to surgery. Of note, her HR would increase above 120 at times with ambulation. She was on Coreg 25 mg bid as taken pre op along with substitution for Telmisartan. She was instructed to follow up with her medical doctor after discharge. Her wound is clean and dry. Chest x ray shows complete opacification of left hemithorax reflective of fluid in the space, secondary to pneumonectomy. As discussed with Dr. Roxan Hockey, she is felt surgically stable for discharge today.   Significant Diagnostic Studies:   Nuclear Medicine:  . Hypermetabolic (max SUV 03.4) solid 3.8 cm posterior left upper lobe lung mass, compatible with primary bronchogenic carcinoma. 2. Hypermetabolic left suprahilar adenopathy. 3. No hypermetabolic distant metastatic disease. 4. PET-CT stage IIB (T2a N1 M0). 5. Additional non hypermetabolic subsolid and ground-glass pulmonary nodules in both lungs, for which attention on follow-up chest CT is advised. 6. Bilateral non hypermetabolic adnexal cysts, largest 4.5 cm on the right. Suggest attention on follow-up pelvic ultrasound in 3-6 months. 7.  Aortic Atherosclerosis (ICD10-I70.0).  Treatments: surgery:  Left VATS with conversion to thoracotomy, left pneumonectomy, lymph node sampling and intercostal nerve blocks at levels 3 through 9.  Discharge Exam: Blood pressure (!) 148/87, pulse 69, temperature 97.6 F (36.4 C), temperature source Oral, resp. rate (!) 23, height 5\' 2"  (1.575 m), weight 95.4 kg, SpO2 95 %.   Cardiovascular: Sightly tachycardic Pulmonary: Clear to auscultation on the right Wounds: Clean and dry.  No erythema or signs of infection.  Disposition: Discharge disposition: 01-Home or Self Care        Allergies as of 07/05/2019      Reactions   Erythromycin Other (See Comments)   Eye ointment only:  "Burned retinas"   Povidone Iodine Other (See Comments)   betadine eye drop caused  burning in eyes      Medication List    STOP taking these medications   lidocaine 2 % solution Commonly known as: XYLOCAINE   magic mouthwash Soln   prochlorperazine 10 MG tablet Commonly known as: COMPAZINE     TAKE these medications   acetaminophen 500 MG tablet Commonly known as: TYLENOL Take 1,000-1,500 mg by mouth daily as needed for moderate pain.   alendronate 70 MG tablet Commonly known as: FOSAMAX Take 70 mg by mouth every Sunday.   aspirin EC 81 MG tablet Take 81 mg by mouth daily at 12 noon.   atorvastatin 40 MG tablet Commonly known as: LIPITOR Take 40 mg by mouth at bedtime.   carvedilol 25 MG tablet Commonly known as: COREG Take 25 mg by mouth 2 (two) times daily with a meal.   cevimeline 30 MG capsule Commonly known as: EVOXAC Take 30 mg by mouth 2 (two) times daily.   DULoxetine 60 MG capsule Commonly known as: CYMBALTA Take 60 mg by mouth daily.   ferrous sulfate 325 (65 FE) MG tablet Take 325 mg by mouth daily.   Fish Oil 1000 MG Caps Take 2,000 mg by mouth 2 (two) times daily.   gabapentin 100 MG capsule Commonly known as: NEURONTIN Take 200 mg by mouth at bedtime.   glimepiride 2 MG tablet Commonly known as: AMARYL Take 2 mg by mouth daily with breakfast.   levothyroxine 88 MCG tablet Commonly known  as: SYNTHROID Take 88 mcg by mouth daily before breakfast.   Magnesium 500 MG Tabs Take 500 mg by mouth daily.   multivitamin with minerals Tabs tablet Take 1 tablet by mouth daily.   nitroGLYCERIN 0.4 MG SL tablet Commonly known as: NITROSTAT Place 0.4 mg under the tongue every 5 (five) minutes x 3 doses as needed for chest pain.   omeprazole 20 MG capsule Commonly known as: PRILOSEC Take 40 mg by mouth daily.   OSTEO BI-FLEX ADV TRIPLE ST PO Take 1 tablet by mouth at bedtime.   RESTFUL LEGS SL Place 2 tablets under the tongue at bedtime.   sucralfate 1 g tablet Commonly known as: CARAFATE SEE NOTES   telmisartan 80  MG tablet Commonly known as: MICARDIS Take 40 mg by mouth daily at 12 noon.   tiZANidine 4 MG tablet Commonly known as: ZANAFLEX Take 8 mg by mouth at bedtime.   Tradjenta 5 MG Tabs tablet Generic drug: linagliptin Take 5 mg by mouth daily.   traMADol 50 MG tablet Commonly known as: Ultram Take 1 tablet (50 mg total) by mouth every 6 (six) hours as needed for moderate pain.   UNABLE TO FIND Med Name: CPAP with 2lpm o2 bled in  [facility]   Vitamin D (Ergocalciferol) 1.25 MG (50000 UT) Caps capsule Commonly known as: DRISDOL Take 50,000 Units by mouth every Sunday.      Follow-up Information    [name], MD. Go on 07/25/2019.   Specialty: Cardiothoracic Surgery Why: PA/LAT CXR to be taken (at [facility] which is in the same building as Dr. Leslee [facility] office) on 09/29 at 2:30 pm; Appointment time is at 3:00 pm Contact information: [place] [address], [place], [place]. Call in 1 day(s).   Specialty: Nurse Practitioner Why: Call for an appointment regarding further blood pressure management Contact information: [address] [place] [address] [phone]           Signed: Nani Skillern PA-C 07/05/2019, 9:20 AM

## 2019-07-03 ENCOUNTER — Inpatient Hospital Stay (HOSPITAL_COMMUNITY): Payer: Medicare Other

## 2019-07-03 LAB — BASIC METABOLIC PANEL
Anion gap: 9 (ref 5–15)
BUN: 10 mg/dL (ref 8–23)
CO2: 25 mmol/L (ref 22–32)
Calcium: 7.2 mg/dL — ABNORMAL LOW (ref 8.9–10.3)
Chloride: 101 mmol/L (ref 98–111)
Creatinine, Ser: 0.96 mg/dL (ref 0.44–1.00)
GFR calc Af Amer: >60 mL/min (ref >60)
GFR calc non Af Amer: >60 mL/min (ref >60)
Glucose, Bld: 139 mg/dL — ABNORMAL HIGH (ref 70–99)
Potassium: 3.7 mmol/L (ref 3.5–5.1)
Sodium: 135 mmol/L (ref 135–145)

## 2019-07-03 LAB — GLUCOSE, CAPILLARY
Glucose-Capillary: 112 mg/dL — ABNORMAL HIGH (ref 70–99)
Glucose-Capillary: 116 mg/dL — ABNORMAL HIGH (ref 70–99)
Glucose-Capillary: 116 mg/dL — ABNORMAL HIGH (ref 70–99)
Glucose-Capillary: 117 mg/dL — ABNORMAL HIGH (ref 70–99)
Glucose-Capillary: 118 mg/dL — ABNORMAL HIGH (ref 70–99)
Glucose-Capillary: 133 mg/dL — ABNORMAL HIGH (ref 70–99)
Glucose-Capillary: 161 mg/dL — ABNORMAL HIGH (ref 70–99)

## 2019-07-03 LAB — CBC
HCT: 28.4 % — ABNORMAL LOW (ref 36.0–46.0)
Hemoglobin: 9.1 g/dL — ABNORMAL LOW (ref 12.0–15.0)
MCH: 34 pg (ref 26.0–34.0)
MCHC: 32 g/dL (ref 30.0–36.0)
MCV: 106 fL — ABNORMAL HIGH (ref 80.0–100.0)
Platelets: 212 10*3/uL (ref 150–400)
RBC: 2.68 MIL/uL — ABNORMAL LOW (ref 3.87–5.11)
RDW: 17.6 % — ABNORMAL HIGH (ref 11.5–15.5)
WBC: 12.2 10*3/uL — ABNORMAL HIGH (ref 4.0–10.5)
nRBC: 0 % (ref 0.0–0.2)

## 2019-07-03 MED ORDER — POTASSIUM CHLORIDE CRYS ER 20 MEQ PO TBCR
20.0000 meq | EXTENDED_RELEASE_TABLET | ORAL | Status: AC
  Administered 2019-07-03 (×3): 20 meq via ORAL
  Filled 2019-07-03 (×4): qty 1

## 2019-07-03 MED ORDER — POTASSIUM CHLORIDE 20 MEQ/15ML (10%) PO SOLN: 20.0000 meq | ORAL | Status: DC

## 2019-07-03 NOTE — Progress Notes (Signed)
3 Days Post-Op Procedure(s) (LRB): Left VIDEO ASSISTED THORACOSCOPY (VATS) / left Thoracotomy with Left Pneumonectomy and node Dissection (Left) Subjective: Patient breathing comfortably, now off PCA on oral pain meds Sinus rhythm Ambulating in hall Chest x-ray shows progressive filling of left pleural space Trachea midline  Objective: Vital signs in last 24 hours: Temp:  [97.6 F (36.4 C)-99 F (37.2 C)] 98.7 F (37.1 C) (09/07 1124) Cardiac Rhythm: Normal sinus rhythm;Sinus tachycardia (09/07 0800) Resp:  [10-25] 13 (09/07 1100) BP: (90-131)/(44-72) 110/72 (09/07 1100) SpO2:  [89 %-100 %] 100 % (09/07 1100) Weight:  [96.5 kg] 96.5 kg (09/07 0500)  Hemodynamic parameters for last 24 hours:  Stable  Intake/Output from previous day: 09/06 0701 - 09/07 0700 In: 959.6 [P.O.:720; I.V.:239.6] Out: 590 [Urine:590] Intake/Output this shift: Total I/O In: 250 [P.O.:240; I.V.:10] Out: -   Breath sounds on right are clear Left thoracotomy incision clean and dry Neuro intact  Lab Results: Recent Labs    07/02/19 0605 07/03/19 0504  WBC 13.0* 12.2*  HGB 9.9* 9.1*  HCT 30.1* 28.4*  PLT 214 212   BMET:  Recent Labs    07/02/19 0605 07/03/19 0504  NA 136 135  K 3.7 3.7  CL 103 101  CO2 23 25  GLUCOSE 137* 139*  BUN 9 10  CREATININE 0.93 0.96  CALCIUM 7.4* 7.2*    PT/INR: No results for input(s): LABPROT, INR in the last 72 hours. ABG    Component Value Date/Time   PHART 7.407 07/02/2019 0437   HCO3 23.3 07/02/2019 0437   TCO2 24 07/02/2019 0437   ACIDBASEDEF 1.0 07/02/2019 0437   O2SAT 96.0 07/02/2019 0437   CBG (last 3)  Recent Labs    07/03/19 0337 07/03/19 0734 07/03/19 1122  GLUCAP 117* 133* 118*    Assessment/Plan: S/P Procedure(s) (LRB): Left VIDEO ASSISTED THORACOSCOPY (VATS) / left Thoracotomy with Left Pneumonectomy and node Dissection (Left) Continue current care Aim for even I/O balance Keep in ICU today  LOS: 3 days    Tharon Aquas  Trigt III 07/03/2019

## 2019-07-03 NOTE — Progress Notes (Signed)
CT Surgery PM Rounds  Stable on 2 L O2 nsr PCA off I/O even

## 2019-07-04 ENCOUNTER — Encounter: Payer: Self-pay | Admitting: *Deleted

## 2019-07-04 ENCOUNTER — Inpatient Hospital Stay (HOSPITAL_COMMUNITY): Payer: Medicare Other

## 2019-07-04 LAB — BASIC METABOLIC PANEL
Anion gap: 8 (ref 5–15)
BUN: 10 mg/dL (ref 8–23)
CO2: 25 mmol/L (ref 22–32)
Calcium: 8.1 mg/dL — ABNORMAL LOW (ref 8.9–10.3)
Chloride: 105 mmol/L (ref 98–111)
Creatinine, Ser: 0.83 mg/dL (ref 0.44–1.00)
GFR calc Af Amer: >60 mL/min (ref >60)
GFR calc non Af Amer: >60 mL/min (ref >60)
Glucose, Bld: 110 mg/dL — ABNORMAL HIGH (ref 70–99)
Potassium: 4.4 mmol/L (ref 3.5–5.1)
Sodium: 138 mmol/L (ref 135–145)

## 2019-07-04 LAB — POCT I-STAT 4, (NA,K, GLUC, HGB,HCT)
Glucose, Bld: 192 mg/dL — ABNORMAL HIGH (ref 70–99)
HCT: 30 % — ABNORMAL LOW (ref 36.0–46.0)
Hemoglobin: 10.2 g/dL — ABNORMAL LOW (ref 12.0–15.0)
Potassium: 3.8 mmol/L (ref 3.5–5.1)
Sodium: 140 mmol/L (ref 135–145)

## 2019-07-04 LAB — CBC
HCT: 27.6 % — ABNORMAL LOW (ref 36.0–46.0)
Hemoglobin: 8.7 g/dL — ABNORMAL LOW (ref 12.0–15.0)
MCH: 33.6 pg (ref 26.0–34.0)
MCHC: 31.5 g/dL (ref 30.0–36.0)
MCV: 106.6 fL — ABNORMAL HIGH (ref 80.0–100.0)
Platelets: 238 10*3/uL (ref 150–400)
RBC: 2.59 MIL/uL — ABNORMAL LOW (ref 3.87–5.11)
RDW: 17.4 % — ABNORMAL HIGH (ref 11.5–15.5)
WBC: 9.1 10*3/uL (ref 4.0–10.5)
nRBC: 0 % (ref 0.0–0.2)

## 2019-07-04 LAB — GLUCOSE, CAPILLARY
Glucose-Capillary: 106 mg/dL — ABNORMAL HIGH (ref 70–99)
Glucose-Capillary: 167 mg/dL — ABNORMAL HIGH (ref 70–99)
Glucose-Capillary: 130 mg/dL — ABNORMAL HIGH (ref 70–99)
Glucose-Capillary: 144 mg/dL — ABNORMAL HIGH (ref 70–99)
Glucose-Capillary: 131 mg/dL — ABNORMAL HIGH (ref 70–99)

## 2019-07-04 MED ORDER — LACTULOSE 10 GM/15ML PO SOLN
20.0000 g | Freq: Once | ORAL | Status: AC
  Administered 2019-07-04: 20 g via ORAL
  Filled 2019-07-04: qty 30

## 2019-07-04 MED ORDER — GLIMEPIRIDE 4 MG PO TABS
2.0000 mg | ORAL_TABLET | Freq: Every day | ORAL | Status: DC
  Administered 2019-07-04 – 2019-07-05 (×2): 2 mg via ORAL
  Filled 2019-07-04 (×3): qty 1

## 2019-07-04 MED ORDER — CARVEDILOL 25 MG PO TABS
25.0000 mg | ORAL_TABLET | Freq: Two times a day (BID) | ORAL | Status: DC
  Administered 2019-07-04 – 2019-07-05 (×3): 25 mg via ORAL
  Filled 2019-07-04 (×3): qty 1

## 2019-07-04 MED ORDER — MAGNESIUM OXIDE 400 (241.3 MG) MG PO TABS
400.0000 mg | ORAL_TABLET | Freq: Every day | ORAL | Status: DC
  Administered 2019-07-04 – 2019-07-05 (×2): 400 mg via ORAL
  Filled 2019-07-04 (×2): qty 1

## 2019-07-04 MED ORDER — INSULIN ASPART 100 UNIT/ML ~~LOC~~ SOLN
0.0000 [IU] | Freq: Three times a day (TID) | SUBCUTANEOUS | Status: DC
  Administered 2019-07-04 (×2): 2 [IU] via SUBCUTANEOUS

## 2019-07-04 NOTE — Progress Notes (Addendum)
[facility] DAILY ICU PROGRESS NOTE                   [place].[address]            ,[place] [address]          [phone]   4 Days Post-Op Procedure(s) (LRB): Left VIDEO ASSISTED THORACOSCOPY (VATS) / left Thoracotomy with Left Pneumonectomy and node Dissection (Left)  Total Length of Stay:  LOS: 4 days   Subjective: Patient sitting in chair. She is passing flatus but no bowel movement yet.  Objective: Vital signs in last 24 hours: Temp:  [97.6 F (36.4 C)-99.1 F (37.3 C)] 97.6 F (36.4 C) (09/07 2340) Pulse Rate:  [84-99] 99 (09/07 2006) Cardiac Rhythm: Normal sinus rhythm (09/08 0400) Resp:  [10-25] 25 (09/08 0700) BP: (94-138)/(43-79) 138/79 (09/08 0700) SpO2:  [94 %-100 %] 94 % (09/08 0714) Weight:  [95.4 kg] 95.4 kg (09/08 0500)  Filed Weights   07/02/19 0600 07/03/19 0500 07/04/19 0500  Weight: 95.7 kg 96.5 kg 95.4 kg    Weight change: -1.1 kg      Intake/Output from previous day: 09/07 0701 - 09/08 0700 In: 495.9 [P.O.:480; I.V.:15.9] Out: 1345 [Urine:1345]  Intake/Output this shift: No intake/output data recorded.  Current Meds: Scheduled Meds: . acetaminophen  1,000 mg Oral Q6H   Or  . acetaminophen (TYLENOL) oral liquid 160 mg/5 mL  1,000 mg Oral Q6H  . atorvastatin  40 mg Oral QHS  . bisacodyl  10 mg Oral Daily  . carvedilol  12.5 mg Oral BID WC  . Chlorhexidine Gluconate Cloth  6 each Topical Daily  . DULoxetine  60 mg Oral Daily  . enoxaparin (LOVENOX) injection  40 mg Subcutaneous Q24H  . gabapentin  200 mg Oral QHS  . insulin aspart  0-24 Units Subcutaneous Q4H  . ipratropium-albuterol  3 mL Nebulization BID  . irbesartan  150 mg Oral Daily  . levothyroxine  88 mcg Oral Q0600  . mouth rinse  15 mL Mouth Rinse BID  . senna-docusate  1 tablet Oral QHS  . sodium chloride flush  10-40 mL Intracatheter Q12H   Continuous Infusions: . sodium chloride Stopped (07/03/19 0835)   PRN Meds:.hydrALAZINE, ondansetron (ZOFRAN) IV, oxyCODONE,  sodium chloride flush  General appearance: alert, cooperative and no distress Heart: Tachycardic Lungs: Clear on the right  Abdomen: Soft, non tender, bowel sounds present Extremities: Trace LE edema Wound: Clean and dry  Lab Results: CBC: Recent Labs    07/03/19 0504 07/04/19 0346  WBC 12.2* 9.1  HGB 9.1* 8.7*  HCT 28.4* 27.6*  PLT 212 238   BMET:  Recent Labs    07/03/19 0504 07/04/19 0346  NA 135 138  K 3.7 4.4  CL 101 105  CO2 25 25  GLUCOSE 139* 110*  BUN 10 10  CREATININE 0.96 0.83  CALCIUM 7.2* 8.1*    CMET: Lab Results  Component Value Date   WBC 9.1 07/04/2019   HGB 8.7 (L) 07/04/2019   HCT 27.6 (L) 07/04/2019   PLT 238 07/04/2019   GLUCOSE 110 (H) 07/04/2019   ALT 19 07/02/2019   AST 30 07/02/2019   NA 138 07/04/2019   K 4.4 07/04/2019   CL 105 07/04/2019   CREATININE 0.83 07/04/2019   BUN 10 07/04/2019   CO2 25 07/04/2019   INR 1.0 06/28/2019   HGBA1C 5.3 06/28/2019      PT/INR: No results for input(s): LABPROT, INR in the last 72  hours. Radiology: Dg Chest Port 1 View  Result Date: 07/04/2019 CLINICAL DATA:  Shortness of breath, history of pneumonectomy EXAM: PORTABLE CHEST 1 VIEW COMPARISON:  07/03/2019 FINDINGS: Complete whiteout of the left hemithorax consistent with history of left pneumonectomy. Right lung is normally expanded. No right pleural effusion or pneumothorax. Stable cardiomediastinal silhouette. Left jugular central venous catheter with the tip projecting over the SVC. Right-sided Port-A-Cath with the tip projecting at the cavoatrial junction. Again noted are multiple rib fractures along the left lateral chest wall likely postsurgical. Soft tissue emphysema in the left lateral chest wall. IMPRESSION: 1. Stable changes compatible with recent left pneumonectomy. Electronically Signed   By: Kathreen Devoid   On: 07/04/2019 06:03     Assessment/Plan: S/P Procedure(s) (LRB): Left VIDEO ASSISTED THORACOSCOPY (VATS) / left Thoracotomy  with Left Pneumonectomy and node Dissection (Left) 1. CV-ST. On Coreg 12.5 mg bid and Irbesartan 150 mg daily. Will increase Coreg to pre op dose of 25 mg bid and give parameters for Irbesartan  2. Pulmonary-on 2 L via [place]. On Duo neb. CXR appears relatively stable. Encourage incentive spirometer. Will have nurse document to see if has desaturation and will require oxygen at discharge. 3. ABL anemia- H and H slightly decreased to 8.7 and 27.6 this am. 4. DM-CBGs 161/116/106. On Insulin. Pre op HGA1C 5.3 5. Hypothyroidism-on Levothyroxine 88 mcg daily 6. Remove central line 7. LOC constipation 8. Will discuss disposition with Dr. Edman Circle Liston Alba PA-C 07/04/2019 7:35 AM   Patient seen and examined, agree with above CXR shows expected progression, right lung clear Ambulated around unit without O2, sat in high 80s, back to 100% with rest Transfer to [place] Possibly home in next 24-48 hours  Remo Lipps C. Roxan Hockey, MD [facility] [phone]

## 2019-07-04 NOTE — Progress Notes (Signed)
Patient arrived from [facility] to 4e14, patient placed on monitor and vital signs obtained. Will monitor patient. Manju Kulkarni, Bettina Gavia rN

## 2019-07-04 NOTE — Progress Notes (Signed)
Spoke with patient's nurse and made her aware of CVC discontinue order.   Delilah Shan Jameca Chumley,RN-VAST

## 2019-07-04 NOTE — Progress Notes (Signed)
Pt ambulated on RA, during ambulation HR inc from 104-143, Sats decreased from 96 to 87. Pt sustained 88-90 sats during entire walk. Pt sustained HR 140's entire walk. Once patient returned to bed post walk, pt HR dec to 104, sats increased 92-94. Pt resting now in bed.

## 2019-07-04 NOTE — Progress Notes (Signed)
Oncology Nurse Navigator Documentation  Oncology Nurse Navigator Flowsheets 07/04/2019  Navigator Location [facility]  Referral Date to RadOnc/MedOnc -  Navigator Encounter Type Other/I followed up on Ms. Tonya Shelton and her surgery.  She is in the ICU and will be transferred to another unit today.  I called and spoke with the nurse taking care of Ms. Tonya Shelton.  I asked that she give her my encouragement.    Telephone -  [facility] Date -  Patient Visit Type -  Treatment Phase -  Barriers/Navigation Needs -  Education -  Interventions Other  Coordination of Care -  Education Method -  Acuity Level 1  Time Spent with Patient 15

## 2019-07-05 ENCOUNTER — Inpatient Hospital Stay (HOSPITAL_COMMUNITY): Payer: Medicare Other

## 2019-07-05 LAB — GLUCOSE, CAPILLARY
Glucose-Capillary: 115 mg/dL — ABNORMAL HIGH (ref 70–99)
Glucose-Capillary: 125 mg/dL — ABNORMAL HIGH (ref 70–99)

## 2019-07-05 MED ORDER — TRAMADOL HCL 50 MG PO TABS: 50.0000 mg | ORAL_TABLET | Freq: Four times a day (QID) | ORAL | 0 refills | Status: DC | PRN

## 2019-07-05 NOTE — Progress Notes (Signed)
Spoke with PA Tacy Dura, stated that she read RN Cloer  note re ambulation/activity this morning.Pt can be discharged per MD.

## 2019-07-05 NOTE — Progress Notes (Signed)
Patient ambulated in hallway, Patient with with shortness of breath while ambulating, slow steady gait. Ambulated on room air Sats dipped briefly to 89 but did not sustain. Remained above 90% for the walk on room air. Ambulated in hallway 240 feet. Heart rate up to 140 on monitor while walking, after sitting in bed and resting after a few minutes rate came down to 109. Now 100.  Patient breathing easier as resting/sitting on bed. Remaining on room air. Will monitor patient. Camary Sosa, Bettina Gavia rN

## 2019-07-05 NOTE — Progress Notes (Addendum)
      [address]       [place] [address]             [phone]       5 Days Post-Op Procedure(s) (LRB): Left VIDEO ASSISTED THORACOSCOPY (VATS) / left Thoracotomy with Left Pneumonectomy and node Dissection (Left)  Subjective: Patient states she "gets pretty short of breath with activity".   Objective: Vital signs in last 24 hours: Temp:  [97.6 F (36.4 C)-98.7 F (37.1 C)] 97.6 F (36.4 C) (09/09 0355) Pulse Rate:  [85-95] 85 (09/09 0355) Cardiac Rhythm: Normal sinus rhythm;Bundle branch block (09/08 1900) Resp:  [14-37] 14 (09/09 0355) BP: (113-157)/(56-77) 157/74 (09/09 0355) SpO2:  [87 %-98 %] 96 % (09/09 0355)      Intake/Output from previous day: 09/08 0701 - 09/09 0700 In: 240 [P.O.:240] Out: -    Physical Exam:  Cardiovascular: Sightly tachycardic Pulmonary: Clear to auscultation on the right Wounds: Clean and dry.  No erythema or signs of infection.   Lab Results: CBC: Recent Labs    07/03/19 0504 07/04/19 0346  WBC 12.2* 9.1  HGB 9.1* 8.7*  HCT 28.4* 27.6*  PLT 212 238   BMET:  Recent Labs    07/03/19 0504 07/04/19 0346  NA 135 138  K 3.7 4.4  CL 101 105  CO2 25 25  GLUCOSE 139* 110*  BUN 10 10  CREATININE 0.96 0.83  CALCIUM 7.2* 8.1*    PT/INR: No results for input(s): LABPROT, INR in the last 72 hours. ABG:  INR: Will add last result for INR, ABG once components are confirmed Will add last 4 CBG results once components are confirmed  Assessment/Plan: 1. CV-ST at times and hypertensive this am. On Coreg 25 mg bid and Irbesartan 150 mg daily as taken prior to surgery. She will need to follow up with her medical doctor after discharge 2. Pulmonary-on room air. On Duo neb. CXR appears  stable. Encourage incentive spirometer.  3. ABL anemia- H and H slightly decreased to 8.7 and 27.6 this am. 4. DM-CBGs 144/131/115. On Insulin. Will restart Tradjenta at discharge as taken pre op. Pre op HGA1C 5.3 5.  Hypothyroidism-on Levothyroxine 88 mcg daily 6. Likely discharge today;will have nurse document oxygen saturation as may need home oxygen. Per patient request, will give Ultram prescription for pain  Donielle M ZimmermanPA-C 07/05/2019,7:07 AM [phone]  Patient seen and examined, agree with above Dc home today She was instructed to gradually increase activity as tolerated  Remo Lipps C. Roxan Hockey, MD [facility] [phone]

## 2019-07-05 NOTE — Care Management Important Message (Signed)
Important Message  Patient Details  Name: Tonya Shelton MRN: [id number] Date of Birth: 04-21-1956   Medicare Important Message Given:  Yes     [name] 07/05/2019, 12:13 PM

## 2019-07-05 NOTE — Progress Notes (Signed)
Patient given discharge instructions, medication list and prescriptions sent to personal pharmacy. Follow up appointments given and patient verbalized understanding. All questions answered IV and tele dcd.  will discharge home as ordered. Transported to exit via wheelchair and hospital staff. Harjas Biggins, Bettina Gavia RN

## 2019-07-11 ENCOUNTER — Encounter: Payer: Self-pay | Admitting: *Deleted

## 2019-07-11 NOTE — Progress Notes (Signed)
Oncology Nurse Navigator Documentation  Oncology Nurse Navigator Flowsheets 07/11/2019  Navigator Location [facility]  Referral Date to RadOnc/MedOnc -  Navigator Encounter Type Other/I updated Dr. Julien Nordmann on patient is post surgery and home.  He would like to see her in a month from surgery.  I completed scheduling message for her to be seen early Oct.    Telephone -  [facility] Date -  Patient Visit Type -  Treatment Phase Follow-up  Barriers/Navigation Needs Coordination of Care  Education -  Interventions Coordination of Care  Coordination of Care Other  Education Method -  Acuity Level 2  Time Spent with Patient 30

## 2019-07-12 ENCOUNTER — Telehealth: Payer: Self-pay | Admitting: Hematology and Oncology

## 2019-07-12 NOTE — Telephone Encounter (Signed)
Scheduled appt per 9/15 sch message - unable to reach pt and unable to leave message. Reminder letter sent with appt date and time

## 2019-07-14 ENCOUNTER — Other Ambulatory Visit: Payer: Self-pay | Admitting: *Deleted

## 2019-07-14 DIAGNOSIS — G8918 Other acute postprocedural pain: Secondary | ICD-10-CM

## 2019-07-14 MED ORDER — TRAMADOL HCL 50 MG PO TABS: 50.0000 mg | ORAL_TABLET | Freq: Four times a day (QID) | ORAL | 0 refills | Status: AC | PRN

## 2019-07-24 ENCOUNTER — Other Ambulatory Visit: Payer: Self-pay | Admitting: Thoracic Surgery (Cardiothoracic Vascular Surgery)

## 2019-07-24 DIAGNOSIS — C3492 Malignant neoplasm of unspecified part of left bronchus or lung: Secondary | ICD-10-CM

## 2019-07-25 ENCOUNTER — Telehealth: Payer: Self-pay | Admitting: *Deleted

## 2019-07-25 ENCOUNTER — Other Ambulatory Visit: Payer: Self-pay | Admitting: Thoracic Surgery (Cardiothoracic Vascular Surgery)

## 2019-07-25 ENCOUNTER — Ambulatory Visit (INDEPENDENT_AMBULATORY_CARE_PROVIDER_SITE_OTHER): Payer: Self-pay | Admitting: Thoracic Surgery (Cardiothoracic Vascular Surgery)

## 2019-07-25 ENCOUNTER — Ambulatory Visit
Admission: RE | Admit: 2019-07-25 | Discharge: 2019-07-25 | Disposition: A | Discharge status: Home / Self Care | Payer: Medicare Other | Source: Ambulatory Visit | Attending: Thoracic Surgery (Cardiothoracic Vascular Surgery) | Admitting: Thoracic Surgery (Cardiothoracic Vascular Surgery)

## 2019-07-25 ENCOUNTER — Other Ambulatory Visit: Payer: Self-pay

## 2019-07-25 VITALS — BP 138/76 | HR 95 | Temp 97.0°F | Resp 20 | Ht 62.0 in | Wt 200.0 lb

## 2019-07-25 DIAGNOSIS — C3492 Malignant neoplasm of unspecified part of left bronchus or lung: Secondary | ICD-10-CM

## 2019-07-25 DIAGNOSIS — Z902 Acquired absence of lung [part of]: Secondary | ICD-10-CM

## 2019-07-25 LAB — CBC WITH DIFFERENTIAL/PLATELET
Absolute Monocytes: 1306 cells/uL — ABNORMAL HIGH (ref 200–950)
Basophils Absolute: 43 cells/uL (ref 0–200)
Basophils Relative: 0.3 %
Eosinophils Absolute: 341 cells/uL (ref 15–500)
Eosinophils Relative: 2.4 %
HCT: 29.9 % — ABNORMAL LOW (ref 35.0–45.0)
Hemoglobin: 9.6 g/dL — ABNORMAL LOW (ref 11.7–15.5)
Lymphs Abs: 1889 cells/uL (ref 850–3900)
MCH: 31.6 pg (ref 27.0–33.0)
MCHC: 32.1 g/dL (ref 32.0–36.0)
MCV: 98.4 fL (ref 80.0–100.0)
MPV: 8.9 fL (ref 7.5–12.5)
Monocytes Relative: 9.2 %
Neutro Abs: 10622 cells/uL — ABNORMAL HIGH (ref 1500–7800)
Neutrophils Relative %: 74.8 %
Platelets: 534 10*3/uL — ABNORMAL HIGH (ref 140–400)
RBC: 3.04 10*6/uL — ABNORMAL LOW (ref 3.80–5.10)
RDW: 16.7 % — ABNORMAL HIGH (ref 11.0–15.0)
Total Lymphocyte: 13.3 %
WBC: 14.2 10*3/uL — ABNORMAL HIGH (ref 3.8–10.8)

## 2019-07-25 LAB — BASIC METABOLIC PANEL
BUN/Creatinine Ratio: 10 (calc) (ref 6–22)
BUN: 11 mg/dL (ref 7–25)
CO2: 28 mmol/L (ref 20–32)
Calcium: 9.1 mg/dL (ref 8.6–10.4)
Chloride: 101 mmol/L (ref 98–110)
Creat: 1.05 mg/dL — ABNORMAL HIGH (ref 0.50–0.99)
Glucose, Bld: 139 mg/dL — ABNORMAL HIGH (ref 65–99)
Potassium: 4.3 mmol/L (ref 3.5–5.3)
Sodium: 139 mmol/L (ref 135–146)

## 2019-07-25 MED ORDER — GABAPENTIN 300 MG PO CAPS: 300.0000 mg | ORAL_CAPSULE | Freq: Two times a day (BID) | ORAL | 2 refills | Status: DC

## 2019-07-25 NOTE — Telephone Encounter (Signed)
Received call from pt's daughter, Everlean Patterson. She states that her mother is not doing well. She is 1 month post-op left pneumonectomy per Dr. Roxan Hockey.  Rip Harbour states her mother is not eating well-becomes nauseated when eating anything, drinking only moderate amount of fluids. Her BP has been low and her cardiologist has stopped one on her BP meds and instructed her to take only 1/2 of the other one. Rip Harbour did not know which one was stopped and which one was cut in half.  Pt also is being treated for a UTI-though just started meds today (Septra). She had a fever of 100, burning when she urinated, flank pain, blood in urine. According to [facility], pt states she feels worse now then when she had chemo.  Pt is also having pain at surgical site but is reluctant to take pain meds  As she doesn't want to become addicted.  She takes an occasional Tramadol and Gabapentin without much relief  Her PCP has not been helpful, according to [place] she is leaving the practice in a week or so.  Pt has her 1st post op appt with Dr. Roxan Hockey today @ 3 pm with a CXR prior to that visit. Discussed the above with Dr. Julien Nordmann. He suggests the daughter see what Dr. Roxan Hockey has to say as she is still a 'post-op' patent. Advised that [place] calls [place] after that appt.. Pt has an appt with Dr. Julien Nordmann on 07/31/19.

## 2019-07-25 NOTE — Progress Notes (Signed)
[address]       [place] [address]             [phone]     HPI: [name] returns for scheduled follow-up visit  Tonya Shelton is a 63 year old woman with a past medical history significant for tobacco abuse, hypertension, hypothyroidism, type 2 diabetes, stage III chronic kidney disease, neuropathy, reflux, and depression.  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  She had neoadjuvant chemoradiation with carboplatin and paclitaxel.  A CT showed a good partial response.  She tolerated those reasonably well without major side effects.  I did a left thoracotomy on 06/30/2019.  She required a pneumonectomy for complete resection.  Posttreatment there was no squamous cell carcinoma present but she had a 1.5 cm adenocarcinoma adjacent to her scar.  Her postoperative course was uncomplicated and she went home on day 5.  Since discharge she has not felt well.  She has had a poor appetite and has felt fatigued.  She recently was diagnosed with a urinary tract infection and was started on Septra for that.  She complains of a burning sensation in her left breast.  She saw her cardiologist this morning and 1 of her blood pressure medications was stopped and another 1 was decreased by half due to a low blood pressure.  She has been using gabapentin 100 mg mg 3 times a day for the neuropathic pain.  Past Medical History:  Diagnosis Date   Acid reflux    Anemia    low iron   Anxiety    Arthritis    Chronic kidney disease    Stage 3 - Dr. Iona Beard in [place], [place]   Depression    Diabetes mellitus without complication ([place])    type 2   Dyspnea    on exertion   Family history of adverse reaction to anesthesia    mom and sister have n/v   H/O bladder problems    leaks   History of hiatal hernia    Hypertension    Hypothyroidism    Neuropathy    Osteoporosis    PVC's (premature  ventricular contractions)    Restless legs    Sleep apnea    uses cpap with oxygen   Squamous cell carcinoma of bronchus in left upper lobe ([place])    Stroke ([place]) 2007   TIA    Current Outpatient Medications  Medication Sig Dispense Refill   acetaminophen (TYLENOL) 500 MG tablet Take 1,000-1,500 mg by mouth daily as needed for moderate pain.      alendronate (FOSAMAX) 70 MG tablet Take 70 mg by mouth every Sunday.      aspirin EC 81 MG tablet Take 81 mg by mouth daily at 12 noon.     atorvastatin (LIPITOR) 40 MG tablet Take 40 mg by mouth at bedtime.      carvedilol (COREG) 25 MG tablet Take 25 mg by mouth 2 (two) times daily with a meal.     cevimeline (EVOXAC) 30 MG capsule Take 30 mg by mouth 2 (two) times daily.      DULoxetine (CYMBALTA) 60 MG capsule Take 60 mg by mouth daily.     ferrous sulfate 325 (65 FE) MG tablet Take 325 mg by mouth daily.   0   gabapentin (NEURONTIN) 300 MG capsule Take 1 capsule (300 mg total) by mouth 2 (two) times daily. [place]  capsule 2   glimepiride (AMARYL) 2 MG tablet Take 2 mg by mouth daily with breakfast.   0   Homeopathic Products (RESTFUL LEGS SL) Place 2 tablets under the tongue at bedtime.     levothyroxine (SYNTHROID, LEVOTHROID) 88 MCG tablet Take 88 mcg by mouth daily before breakfast.     Magnesium 500 MG TABS Take 500 mg by mouth daily.     Misc Natural Products (OSTEO BI-FLEX ADV TRIPLE ST PO) Take 1 tablet by mouth at bedtime.      Multiple Vitamin (MULTIVITAMIN WITH MINERALS) TABS tablet Take 1 tablet by mouth daily.     nitrofurantoin (MACRODANTIN) 100 MG capsule Take 100 mg by mouth 2 (two) times daily.     nitroGLYCERIN (NITROSTAT) 0.4 MG SL tablet Place 0.4 mg under the tongue every 5 (five) minutes x 3 doses as needed for chest pain.      Omega-3 Fatty Acids (FISH OIL) 1000 MG CAPS Take 2,000 mg by mouth 2 (two) times daily.     omeprazole (PRILOSEC) 20 MG capsule Take 40 mg by mouth daily.      tiZANidine  (ZANAFLEX) 4 MG tablet Take 8 mg by mouth at bedtime.      UNABLE TO FIND Med Name: CPAP with 2lpm o2 bled in  [facility]     Vitamin D, Ergocalciferol, (DRISDOL) 50000 units CAPS capsule Take 50,000 Units by mouth every Sunday.      TRADJENTA 5 MG TABS tablet Take 5 mg by mouth daily.  0   No current facility-administered medications for this visit.     Physical Exam BP 138/76    Pulse 95    Temp (!) 97 F (36.1 C) (Skin)    Resp 20    Ht 5\' 2"  (1.575 m)    Wt 200 lb (90.7 kg)    SpO2 90% Comment: RA   BMI 36.58 kg/m  Obese 63 year old woman in no acute distress Alert and oriented x3 with no focal deficits Right lung clear, no breath sounds on left Cardiac regular rate and rhythm Incisions clean dry and intact  Diagnostic Tests: CHEST - 2 VIEW  COMPARISON:  07/05/2019  FINDINGS: Redemonstrated left hydropneumothorax status post pneumonectomy with near-total fluid opacification of the left hemithorax and a very small residual component of left apical pneumothorax. Interval resolution of previously seen subcutaneous emphysema about the left chest. Multiple left-sided rib resections. The right lung is normally aerated. Right chest port catheter. The cardiac and mediastinal borders are largely obscured.  IMPRESSION: 1. Redemonstrated left hydropneumothorax status post pneumonectomy with near-total fluid opacification of the left hemithorax and a very small residual component of left apical pneumothorax.  2.  The right lung is normally aerated.   Electronically Signed   By: Eddie Candle M.D.   On: 07/25/2019 14:40 I personally reviewed the chest x-ray images and concur with the findings noted above.  Normal progression of fluid opacification of the chest post pneumonectomy.  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  She underwent neoadjuvant chemoradiation  with a good partial response.  I did a left thoracotomy to resect the residual disease.  She required a pneumonectomy for complete resection.  In the pathologic specimen there was no residual squamous cell carcinoma but there was an area of adenocarcinoma 1.5 cm in size.  Her lymph nodes were negative.  She did well initially while in the  hospital.  She has had some issues since discharge.  Her primary complaint is poor appetite.  Food does not taste good.  She gets nausea after eating.  It is not uncommon to have a poor appetite initially after major surgical procedure.  She was recently diagnosed with a urinary tract infection, which may be a contributor to her symptoms.  She was prescribed Septra but has not yet started taking it.  Her incisions are healing well her chest x-ray shows good progression of fluid filling the left pleural space.  She does have significant neuropathic pain.  This is a burning sensation in her left breast.  She is taking gabapentin for that.  It is a little unclear from the records but she was started on 100 mg twice daily.  Currently she is taking 2 tablets twice a day.  We can increase that to 2 tablets 3 times a day.  I also gave her a prescription for 300 mg tablets which she can take once twice daily when her current prescription runs out.  She is seeing Dr. Julien Nordmann next week.  I will plan to see her back in about 3 weeks to check on her progress.  Plan: 1. Hold iron. 2. Increase gabapentin to 200 mg 3 times daily for now.  Once this prescription runs out we will do 300 mg twice daily.She will wait to fill the new prescription until her current prescription runs out. 3. Complete Septra for urinary tract infection. 4. Follow-up as scheduled with Dr. Julien Nordmann next week 5.  Return in 3 weeks with PA lateral chest x-ray   [name], MD [facility] [phone]

## 2019-07-28 ENCOUNTER — Other Ambulatory Visit: Payer: Self-pay | Admitting: *Deleted

## 2019-07-28 DIAGNOSIS — C3492 Malignant neoplasm of unspecified part of left bronchus or lung: Secondary | ICD-10-CM

## 2019-07-31 ENCOUNTER — Inpatient Hospital Stay: Payer: Medicare Other | Attending: Internal Medicine | Admitting: Internal Medicine

## 2019-07-31 ENCOUNTER — Other Ambulatory Visit: Payer: Self-pay

## 2019-07-31 ENCOUNTER — Inpatient Hospital Stay: Payer: Medicare Other

## 2019-07-31 ENCOUNTER — Encounter: Payer: Self-pay | Admitting: Internal Medicine

## 2019-07-31 VITALS — BP 125/46 | HR 101 | Temp 98.7°F | Resp 18 | Ht 62.0 in | Wt 199.5 lb

## 2019-07-31 DIAGNOSIS — F419 Anxiety disorder, unspecified: Secondary | ICD-10-CM | POA: Insufficient documentation

## 2019-07-31 DIAGNOSIS — C3411 Malignant neoplasm of upper lobe, right bronchus or lung: Secondary | ICD-10-CM | POA: Diagnosis not present

## 2019-07-31 DIAGNOSIS — Z9889 Other specified postprocedural states: Secondary | ICD-10-CM | POA: Diagnosis not present

## 2019-07-31 DIAGNOSIS — Z8673 Personal history of transient ischemic attack (TIA), and cerebral infarction without residual deficits: Secondary | ICD-10-CM | POA: Insufficient documentation

## 2019-07-31 DIAGNOSIS — C3490 Malignant neoplasm of unspecified part of unspecified bronchus or lung: Secondary | ICD-10-CM | POA: Diagnosis not present

## 2019-07-31 DIAGNOSIS — E119 Type 2 diabetes mellitus without complications: Secondary | ICD-10-CM | POA: Insufficient documentation

## 2019-07-31 DIAGNOSIS — Z79899 Other long term (current) drug therapy: Secondary | ICD-10-CM | POA: Diagnosis not present

## 2019-07-31 DIAGNOSIS — E039 Hypothyroidism, unspecified: Secondary | ICD-10-CM | POA: Insufficient documentation

## 2019-07-31 DIAGNOSIS — Z902 Acquired absence of lung [part of]: Secondary | ICD-10-CM | POA: Insufficient documentation

## 2019-07-31 DIAGNOSIS — F17200 Nicotine dependence, unspecified, uncomplicated: Secondary | ICD-10-CM | POA: Diagnosis not present

## 2019-07-31 DIAGNOSIS — Z7984 Long term (current) use of oral hypoglycemic drugs: Secondary | ICD-10-CM | POA: Diagnosis not present

## 2019-07-31 DIAGNOSIS — I10 Essential (primary) hypertension: Secondary | ICD-10-CM | POA: Diagnosis not present

## 2019-07-31 DIAGNOSIS — C3492 Malignant neoplasm of unspecified part of left bronchus or lung: Secondary | ICD-10-CM | POA: Diagnosis not present

## 2019-07-31 DIAGNOSIS — Z7982 Long term (current) use of aspirin: Secondary | ICD-10-CM | POA: Insufficient documentation

## 2019-07-31 LAB — CBC WITH DIFFERENTIAL (CANCER CENTER ONLY)
Abs Immature Granulocytes: 0.07 10*3/uL (ref 0.00–0.07)
Basophils Absolute: 0 10*3/uL (ref 0.0–0.1)
Basophils Relative: 0 %
Eosinophils Absolute: 0.3 10*3/uL (ref 0.0–0.5)
Eosinophils Relative: 3 %
HCT: 29.5 % — ABNORMAL LOW (ref 36.0–46.0)
Hemoglobin: 9.5 g/dL — ABNORMAL LOW (ref 12.0–15.0)
Immature Granulocytes: 1 %
Lymphocytes Relative: 13 %
Lymphs Abs: 1.4 10*3/uL (ref 0.7–4.0)
MCH: 31.9 pg (ref 26.0–34.0)
MCHC: 32.2 g/dL (ref 30.0–36.0)
MCV: 99 fL (ref 80.0–100.0)
Monocytes Absolute: 0.8 10*3/uL (ref 0.1–1.0)
Monocytes Relative: 8 %
Neutro Abs: 8.4 10*3/uL — ABNORMAL HIGH (ref 1.7–7.7)
Neutrophils Relative %: 75 %
Platelet Count: 415 10*3/uL — ABNORMAL HIGH (ref 150–400)
RBC: 2.98 MIL/uL — ABNORMAL LOW (ref 3.87–5.11)
RDW: 16.7 % — ABNORMAL HIGH (ref 11.5–15.5)
WBC Count: 11.1 10*3/uL — ABNORMAL HIGH (ref 4.0–10.5)
nRBC: 0 % (ref 0.0–0.2)

## 2019-07-31 LAB — CMP (CANCER CENTER ONLY)
ALT: 21 U/L (ref 0–44)
AST: 18 U/L (ref 15–41)
Albumin: 2.8 g/dL — ABNORMAL LOW (ref 3.5–5.0)
Alkaline Phosphatase: 130 U/L — ABNORMAL HIGH (ref 38–126)
Anion gap: 11 (ref 5–15)
BUN: 9 mg/dL (ref 8–23)
CO2: 27 mmol/L (ref 22–32)
Calcium: 9.5 mg/dL (ref 8.9–10.3)
Chloride: 102 mmol/L (ref 98–111)
Creatinine: 1.09 mg/dL — ABNORMAL HIGH (ref 0.44–1.00)
GFR, Est AFR Am: >60 mL/min (ref >60)
GFR, Estimated: 53 mL/min — ABNORMAL LOW (ref >60)
Glucose, Bld: 170 mg/dL — ABNORMAL HIGH (ref 70–99)
Potassium: 3.7 mmol/L (ref 3.5–5.1)
Sodium: 140 mmol/L (ref 135–145)
Total Bilirubin: 0.3 mg/dL (ref 0.3–1.2)
Total Protein: 7.1 g/dL (ref 6.5–8.1)

## 2019-07-31 NOTE — Progress Notes (Signed)
[place] [phone]   [phone]  OFFICE PROGRESS NOTE  [name], FNP [place] [address]  DIAGNOSIS: stage IIb (T2b, N1, M0) non-small cell lung cancer, squamous cell carcinoma presented with right upper lobe lung mass in addition to left hilar adenopathy.  There was also a suspicious groundglass opacity bilaterally.  PRIOR THERAPY:  1) Concurrent chemoradiation with weekly carboplatin for AUC of 2 and paclitaxel 45 mg/M2.  Status post 10 cycles.  She received her radiation in [facility].  Last chemotherapy was given May 01, 2019. 2) status post left VATS and left pneumonectomy with lymph node sampling under the care of Dr. Roxan Hockey on 06/30/2019.  The final pathology was consistent with invasive pulmonary adenocarcinoma measuring 1.2 cm with a final stage of IA (ypT1b, ypN0).  CURRENT THERAPY: None.  INTERVAL HISTORY: Tonya Shelton 63 y.o. female returns to the clinic today for follow-up visit.  The patient is feeling fine today with no concerning complaints.  She recently underwent left pneumonectomy for resection of the residual tumor after the course of concurrent chemoradiation.  The final pathology showed residual 1.2 cm adenocarcinoma.  She is feeling much better today except for shortness of breath with exertion.  She denied having any current chest pain, cough or hemoptysis.  She has no nausea, vomiting, diarrhea or constipation.  She was treated recently for urinary tract infection and she is feeling better.  The patient is here today for evaluation and recommendation regarding treatment of her condition.  MEDICAL HISTORY: Past Medical History:  Diagnosis Date   Acid reflux    Anemia    low iron   Anxiety    Arthritis    Chronic kidney disease    Stage 3 - Dr. Iona Beard in [place], [place]   Depression    Diabetes mellitus without complication ([place])    type 2   Dyspnea    on  exertion   Family history of adverse reaction to anesthesia    mom and sister have n/v   H/O bladder problems    leaks   History of hiatal hernia    Hypertension    Hypothyroidism    Neuropathy    Osteoporosis    PVC's (premature ventricular contractions)    Restless legs    Sleep apnea    uses cpap with oxygen   Squamous cell carcinoma of bronchus in left upper lobe ([place])    Stroke ([place]) 2007   TIA    ALLERGIES:  is allergic to erythromycin and povidone iodine.  MEDICATIONS:  Current Outpatient Medications  Medication Sig Dispense Refill   acetaminophen (TYLENOL) 500 MG tablet Take 1,000-1,500 mg by mouth daily as needed for moderate pain.      alendronate (FOSAMAX) 70 MG tablet Take 70 mg by mouth every Sunday.      aspirin EC 81 MG tablet Take 81 mg by mouth daily at 12 noon.     atorvastatin (LIPITOR) 40 MG tablet Take 40 mg by mouth at bedtime.      carvedilol (COREG) 25 MG tablet Take 25 mg by mouth 2 (two) times daily with a meal.     cevimeline (EVOXAC) 30 MG capsule Take 30 mg by mouth 2 (two) times daily.      DULoxetine (CYMBALTA) 60 MG capsule Take 60 mg by mouth daily.     ferrous sulfate 325 (65 FE) MG tablet Take 325 mg by mouth daily.  0   gabapentin (NEURONTIN) 300 MG capsule Take 1 capsule (300 mg total) by mouth 2 (two) times daily. 60 capsule 2   glimepiride (AMARYL) 2 MG tablet Take 2 mg by mouth daily with breakfast.   0   Homeopathic Products (RESTFUL LEGS SL) Place 2 tablets under the tongue at bedtime.     levothyroxine (SYNTHROID, LEVOTHROID) 88 MCG tablet Take 88 mcg by mouth daily before breakfast.     Magnesium 500 MG TABS Take 500 mg by mouth daily.     Misc Natural Products (OSTEO BI-FLEX ADV TRIPLE ST PO) Take 1 tablet by mouth at bedtime.      Multiple Vitamin (MULTIVITAMIN WITH MINERALS) TABS tablet Take 1 tablet by mouth daily.     nitrofurantoin (MACRODANTIN) 100 MG capsule Take 100 mg by mouth 2 (two) times  daily.     nitroGLYCERIN (NITROSTAT) 0.4 MG SL tablet Place 0.4 mg under the tongue every 5 (five) minutes x 3 doses as needed for chest pain.      Omega-3 Fatty Acids (FISH OIL) 1000 MG CAPS Take 2,000 mg by mouth 2 (two) times daily.     omeprazole (PRILOSEC) 20 MG capsule Take 40 mg by mouth daily.      tiZANidine (ZANAFLEX) 4 MG tablet Take 8 mg by mouth at bedtime.      TRADJENTA 5 MG TABS tablet Take 5 mg by mouth daily.  0   UNABLE TO FIND Med Name: CPAP with 2lpm o2 bled in  [place]     Vitamin D, Ergocalciferol, (DRISDOL) 50000 units CAPS capsule Take 50,000 Units by mouth every Sunday.      No current facility-administered medications for this visit.     SURGICAL HISTORY:  Past Surgical History:  Procedure Laterality Date   BUNIONECTOMY     CHOLECYSTECTOMY     GALLBLADDER SURGERY     IR IMAGING GUIDED PORT INSERTION  03/07/2019   LUMBAR LAMINECTOMY/DECOMPRESSION MICRODISCECTOMY N/A 10/15/2017   Procedure: LEFT L5-S1 MICRODISCECTOMY;  Surgeon: [name], MD;  Location: [facility];  Service: Orthopedics;  Laterality: N/A;   NEUROMA SURGERY     SPINAL FUSION  2003   cervical   tonsillectomy     TONSILLECTOMY     VIDEO ASSISTED THORACOSCOPY (VATS)/ LOBECTOMY Left 06/30/2019   Procedure: Left VIDEO ASSISTED THORACOSCOPY (VATS) / left Thoracotomy with Left Pneumonectomy and node Dissection;  Surgeon: [name], MD;  Location: [facility];  Service: Thoracic;  Laterality: Left;   VIDEO BRONCHOSCOPY WITH RADIAL ENDOBRONCHIAL ULTRASOUND N/A 02/09/2019   Procedure: VIDEO BRONCHOSCOPY WITH RADIAL ENDOBRONCHIAL ULTRASOUND;  Surgeon: [name], MD;  Location: [facility];  Service: Thoracic;  Laterality: N/A;    REVIEW OF SYSTEMS:  Constitutional: positive for fatigue Eyes: negative Ears, nose, mouth, throat, and face: negative Respiratory: positive for dyspnea on exertion Cardiovascular: negative Gastrointestinal:  negative Genitourinary:negative Integument/breast: negative Hematologic/lymphatic: negative Musculoskeletal:negative Neurological: negative Behavioral/Psych: negative Endocrine: negative Allergic/Immunologic: negative   PHYSICAL EXAMINATION: General appearance: alert, cooperative and no distress Head: Normocephalic, without obvious abnormality, atraumatic Neck: no adenopathy, no JVD, supple, symmetrical, trachea midline and thyroid not enlarged, symmetric, no tenderness/mass/nodules Lymph nodes: Cervical, supraclavicular, and axillary nodes normal. Resp: clear to auscultation bilaterally Back: symmetric, no curvature. ROM normal. No CVA tenderness. Cardio: regular rate and rhythm, S1, S2 normal, no murmur, click, rub or gallop GI: soft, non-tender; bowel sounds normal; no masses,  no organomegaly Extremities: extremities normal, atraumatic, no cyanosis or edema Neurologic: Alert and oriented X 3,  normal strength and tone. Normal symmetric reflexes. Normal coordination and gait  ECOG PERFORMANCE STATUS: 1 - Symptomatic but completely ambulatory  Blood pressure (!) 125/46, pulse (!) 101, temperature 98.7 F (37.1 C), temperature source Temporal, resp. rate 18, height 5\' 2"  (1.575 m), weight 199 lb 8 oz (90.5 kg), SpO2 94 %.  LABORATORY DATA: Lab Results  Component Value Date   WBC 11.1 (H) 07/31/2019   HGB 9.5 (L) 07/31/2019   HCT 29.5 (L) 07/31/2019   MCV 99.0 07/31/2019   PLT 415 (H) 07/31/2019      Chemistry      Component Value Date/Time   NA 139 07/25/2019 1548   K 4.3 07/25/2019 1548   CL 101 07/25/2019 1548   CO2 28 07/25/2019 1548   BUN 11 07/25/2019 1548   CREATININE 1.05 (H) 07/25/2019 1548      Component Value Date/Time   CALCIUM 9.1 07/25/2019 1548   ALKPHOS 61 07/02/2019 0605   AST 30 07/02/2019 0605   AST 25 05/25/2019 1217   ALT 19 07/02/2019 0605   ALT 37 05/25/2019 1217   BILITOT 0.7 07/02/2019 0605   BILITOT 0.5 05/25/2019 1217        RADIOGRAPHIC STUDIES: Dg Chest 2 View  Result Date: 07/25/2019 CLINICAL DATA:  Lung cancer EXAM: CHEST - 2 VIEW COMPARISON:  07/05/2019 FINDINGS: Redemonstrated left hydropneumothorax status post pneumonectomy with near-total fluid opacification of the left hemithorax and a very small residual component of left apical pneumothorax. Interval resolution of previously seen subcutaneous emphysema about the left chest. Multiple left-sided rib resections. The right lung is normally aerated. Right chest port catheter. The cardiac and mediastinal borders are largely obscured. IMPRESSION: 1. Redemonstrated left hydropneumothorax status post pneumonectomy with near-total fluid opacification of the left hemithorax and a very small residual component of left apical pneumothorax. 2.  The right lung is normally aerated. Electronically Signed   By: Eddie Candle M.D.   On: 07/25/2019 14:40   Dg Chest Port 1 View  Result Date: 07/05/2019 CLINICAL DATA:  63 year old female with history of pneumonectomy. Follow-up study. EXAM: PORTABLE CHEST 1 VIEW COMPARISON:  Chest x-ray 07/04/2019. FINDINGS: Previously noted left internal jugular central venous catheter has been removed. Postoperative changes of left thoracotomy for left pneumonectomy redemonstrated. Complete opacification of the left hemithorax presumably reflective of fluid in the space. Right lung is clear. No right pleural effusion. No evidence of pulmonary edema. No suspicious right pulmonary nodule. Cardiomediastinal silhouette is obscured by adjacent left-sided opacities. Right internal jugular single-lumen power porta cath with tip terminating at the superior cavoatrial junction. IMPRESSION: 1. Postoperative changes and support apparatus, as above. 2. Otherwise, stable radiographic appearance of the chest, as above. Electronically Signed   By: Vinnie Langton M.D.   On: 07/05/2019 09:04   Dg Chest Port 1 View  Result Date: 07/04/2019 CLINICAL DATA:   Shortness of breath, history of pneumonectomy EXAM: PORTABLE CHEST 1 VIEW COMPARISON:  07/03/2019 FINDINGS: Complete whiteout of the left hemithorax consistent with history of left pneumonectomy. Right lung is normally expanded. No right pleural effusion or pneumothorax. Stable cardiomediastinal silhouette. Left jugular central venous catheter with the tip projecting over the SVC. Right-sided Port-A-Cath with the tip projecting at the cavoatrial junction. Again noted are multiple rib fractures along the left lateral chest wall likely postsurgical. Soft tissue emphysema in the left lateral chest wall. IMPRESSION: 1. Stable changes compatible with recent left pneumonectomy. Electronically Signed   By: Kathreen Devoid   On: 07/04/2019 06:03  Dg Chest Port 1 View  Result Date: 07/03/2019 CLINICAL DATA:  Patient status post left pneumonectomy 06/30/2019. EXAM: PORTABLE CHEST 1 VIEW COMPARISON:  Single-view of the chest 07/02/2019 and 07/01/2019. FINDINGS: Near complete whiteout of the left chest consistent with pleural effusion appears unchanged compared to yesterday's examination. Subcutaneous emphysema along the left chest wall has slightly increased. Right lung is expanded and clear. Cardiac silhouette is obscured. Right Port-A-Cath and left IJ central venous catheter are unchanged. IMPRESSION: No change in near complete whiteout of the left chest in this patient status post pneumonectomy. Some increase in subcutaneous emphysema over the left chest. Electronically Signed   By: Inge Rise M.D.   On: 07/03/2019 09:15   Dg Chest Port 1 View  Result Date: 07/02/2019 CLINICAL DATA:  Patient status post left pneumonectomy 06/30/2019. EXAM: PORTABLE CHEST 1 VIEW COMPARISON:  Single-view of the chest 07/01/2019 and 06/30/2019. FINDINGS: Left chest tube has been removed. Left pleural effusion has increased and now nearly fills the left chest. The right lung is expanded and clear. Cardiac silhouette is largely  obscured. Subcutaneous emphysema along the left chest wall has decreased. IMPRESSION: Left chest tube has been removed. Left pleural effusion now nearly completely opacifies the left chest. No new abnormality. Electronically Signed   By: Inge Rise M.D.   On: 07/02/2019 08:47    ASSESSMENT AND PLAN: This is a very pleasant 63 years old white female with currently unresectable stage IIB non-small cell lung cancer, squamous cell carcinoma. The patient was a started on a course of concurrent chemoradiation with weekly carboplatin and paclitaxel.  She tolerated this course of the treatment fairly well. Repeat imaging studies showed significant improvement of her disease.  The patient was referred to Dr. Roxan Hockey and she underwent left pneumonectomy with lymph node sampling on June 30, 2019. The patient is recovering fairly well from her surgery.  The final pathology revealed only a small 1.2 cm residual tumor with no metastatic disease to the lymph node or resection margin. I had a lengthy discussion with the patient today about her condition.  I recommended for the patient to continue on observation with repeat CT scan of the chest in 3 months for restaging of her disease. She was advised to call immediately if she has any concerning symptoms in the interval. The patient voices understanding of current disease status and treatment options and is in agreement with the current care plan.  All questions were answered. The patient knows to call the clinic with any problems, questions or concerns. We can certainly see the patient much sooner if necessary. I spent 15 minutes counseling the patient face to face. The total time spent in the appointment was 25 minutes.  Disclaimer: This note was dictated with voice recognition software. Similar sounding words can inadvertently be transcribed and may not be corrected upon review.

## 2019-08-01 ENCOUNTER — Telehealth: Payer: Self-pay | Admitting: Internal Medicine

## 2019-08-01 NOTE — Telephone Encounter (Signed)
Scheduled appt per 10/5 los - mailed reminder letter with appt date and time

## 2019-08-03 ENCOUNTER — Encounter: Payer: Self-pay | Admitting: *Deleted

## 2019-08-14 ENCOUNTER — Other Ambulatory Visit: Payer: Self-pay | Admitting: Thoracic Surgery (Cardiothoracic Vascular Surgery)

## 2019-08-14 DIAGNOSIS — C3490 Malignant neoplasm of unspecified part of unspecified bronchus or lung: Secondary | ICD-10-CM

## 2019-08-15 ENCOUNTER — Ambulatory Visit
Admission: RE | Admit: 2019-08-15 | Discharge: 2019-08-15 | Disposition: A | Discharge status: Home / Self Care | Payer: Medicare Other | Source: Ambulatory Visit | Attending: Thoracic Surgery (Cardiothoracic Vascular Surgery) | Admitting: Thoracic Surgery (Cardiothoracic Vascular Surgery)

## 2019-08-15 ENCOUNTER — Other Ambulatory Visit: Payer: Self-pay

## 2019-08-15 ENCOUNTER — Encounter: Payer: Self-pay | Admitting: Thoracic Surgery (Cardiothoracic Vascular Surgery)

## 2019-08-15 ENCOUNTER — Ambulatory Visit (INDEPENDENT_AMBULATORY_CARE_PROVIDER_SITE_OTHER): Payer: Self-pay | Admitting: Thoracic Surgery (Cardiothoracic Vascular Surgery)

## 2019-08-15 VITALS — BP 133/84 | HR 87 | Temp 97.7°F | Resp 16 | Ht 62.0 in | Wt 199.0 lb

## 2019-08-15 DIAGNOSIS — Z902 Acquired absence of lung [part of]: Secondary | ICD-10-CM

## 2019-08-15 DIAGNOSIS — C3490 Malignant neoplasm of unspecified part of unspecified bronchus or lung: Secondary | ICD-10-CM

## 2019-08-15 NOTE — Progress Notes (Signed)
[address]       Tonya Shelton,[place] [address]             [phone]    HPI: Tonya Shelton returns for scheduled follow-up  Tonya Shelton is a 63 year old woman with a history of tobacco abuse, hypertension, hypothyroidism, type 2 diabetes, obesity, stage III chronic kidney disease, neuropathy, reflux, and depression.  She was diagnosed with stage IIb squamous cell carcinoma of the left upper lobe earlier this year.  She was treated with preoperative neoadjuvant chemoradiation.  She had a good response but it was partial and not complete.  I did a left thoracotomy on 06/30/2019.  The only way to get a complete resection was to do a pneumonectomy.  On pathology there was no residual squamous cell carcinoma but there was a 1.5 cm adenocarcinoma adjacent to her scar.  Her initial postoperative course was uncomplicated and she went home on day 5.  I saw her back in the office on 07/25/2019.  She was feeling very poorly at that time.  She had been diagnosed with a urinary tract infection and was on antibiotics.  She was having difficulty with her appetite and having trouble eating anything.  She complained of a burning sensation in her left breast.  In the interim since her last visit she has seen Dr. Julien Nordmann.  He is planning to see her back in 3 months with a CT of the chest.  She feels better than she did 3 weeks ago.  She still has some pain in the left breast but the burning and tingling sensation has resolved.  She wants to cut back on her gabapentin.  She does get short of breath with activity.  She is seeing some improvement but was hoping for more.  Her appetite has improved although is not back to her baseline.  Past Medical History:  Diagnosis Date  . Acid reflux   . Anemia    low iron  . Anxiety   . Arthritis   . Chronic kidney disease    Stage 3 - Dr. Iona Beard in [place], [place]  . Depression   . Diabetes mellitus without complication ([place])    type 2  . Dyspnea    on exertion  . Family history of adverse reaction to anesthesia    mom and sister have n/v  . H/O bladder problems    leaks  . History of hiatal hernia   . Hypertension   . Hypothyroidism   . Neuropathy   . Osteoporosis   . PVC's (premature ventricular contractions)   . Restless legs   . Sleep apnea    uses cpap with oxygen  . Squamous cell carcinoma of bronchus in left upper lobe ([place])   . Stroke [facility]) 2007   TIA    Current Outpatient Medications  Medication Sig Dispense Refill  . acetaminophen (TYLENOL) 500 MG tablet Take 1,000-1,500 mg by mouth daily as needed for moderate pain.     Marland Kitchen alendronate (FOSAMAX) 70 MG tablet Take 70 mg by mouth every Sunday.     Marland Kitchen aspirin EC 81 MG tablet Take 81 mg by mouth daily at 12 noon.    Marland Kitchen atorvastatin (LIPITOR) 40 MG tablet Take 40 mg by mouth at bedtime.     . carvedilol (COREG) 25 MG tablet Take 25 mg by mouth 2 (two) times daily with a meal.    . cevimeline (EVOXAC) 30 MG capsule Take 30 mg by mouth 2 (two)  times daily.     . DULoxetine (CYMBALTA) 60 MG capsule Take 60 mg by mouth daily.    Marland Kitchen gabapentin (NEURONTIN) 300 MG capsule Take 1 capsule (300 mg total) by mouth 2 (two) times daily. 60 capsule 2  . Homeopathic Products (RESTFUL LEGS SL) Place 2 tablets under the tongue at bedtime.    Marland Kitchen levothyroxine (SYNTHROID, LEVOTHROID) 88 MCG tablet Take 88 mcg by mouth daily before breakfast.    . Magnesium 500 MG TABS Take 500 mg by mouth daily.    . Misc Natural Products (OSTEO BI-FLEX ADV TRIPLE ST PO) Take 1 tablet by mouth at bedtime.     . Multiple Vitamin (MULTIVITAMIN WITH MINERALS) TABS tablet Take 1 tablet by mouth daily.    . nitrofurantoin (MACRODANTIN) 100 MG capsule Take 100 mg by mouth 2 (two) times daily.    . nitroGLYCERIN (NITROSTAT) 0.4 MG SL tablet Place 0.4 mg under the tongue every 5 (five) minutes x 3 doses as needed for chest pain.     . Omega-3 Fatty Acids (FISH OIL) 1000 MG CAPS Take 2,000 mg by mouth 2 (two) times  daily.    Marland Kitchen omeprazole (PRILOSEC) 20 MG capsule Take 40 mg by mouth daily.     Marland Kitchen tiZANidine (ZANAFLEX) 4 MG tablet Take 8 mg by mouth at bedtime.     . TRADJENTA 5 MG TABS tablet Take 5 mg by mouth daily.  0  . UNABLE TO FIND Med Name: CPAP with 2lpm o2 bled in  [place]    . Vitamin D, Ergocalciferol, (DRISDOL) 50000 units CAPS capsule Take 50,000 Units by mouth every Sunday.      No current facility-administered medications for this visit.     Physical Exam BP 133/84 (BP Location: Left Arm, Patient Position: Sitting, Cuff Size: Normal)   Pulse 87   Temp 97.7 F (36.5 C)   Resp 16   Ht 5\' 2"  (1.575 m)   Wt 199 lb (90.3 kg)   SpO2 94% Comment: RA  BMI 36.45 kg/m  63 year old woman in no acute distress Alert and oriented x3 with no focal deficits Incisions healing well Right lung is clear, no breath sounds on left Cardiac regular rate and rhythm No peripheral edema  Diagnostic Tests: CHEST - 2 VIEW  COMPARISON:  Chest x-ray dated 07/25/2019  FINDINGS: Power port appears in good position with the tip just above the cavoatrial junction. Right lung is clear. No effusion.  Almost complete opacification of the left hemithorax after left pneumonectomy. Tiny air collection at the left apex, diminished since the prior study.  Pulmonary vascularity in the right lung is normal.  No acute bone abnormality.  IMPRESSION: 1. Almost complete opacification of the left hemithorax after left pneumonectomy. 2. Tiny air collection at the left apex, diminished since the prior study. 3. No acute abnormalities.   Electronically Signed   By: Lorriane Shire M.D.   On: 08/15/2019 15:31 I personally reviewed the chest x-ray images and concur with the findings noted above  Impression: Tonya Shelton is a 63 year old woman with a history of tobacco abuse, hypertension, hypothyroidism, type 2 diabetes, obesity, stage III chronic kidney disease, neuropathy, reflux, and depression.   She was diagnosed with stage IIb squamous cell carcinoma of the left upper lobe earlier this year.  She was treated with preoperative neoadjuvant chemoradiation.  She then underwent a left thoracotomy and pneumonectomy.  Her squamous cell carcinoma had completely resolved but there was a 1.5 cm adenocarcinoma adjacent to her  scar.  Her in-hospital course was uncomplicated but she did develop a urinary tract infection after discharge.  That was treated with antibiotics.    When I saw her a couple of weeks ago she was feeling poorly with a lot of pain and paresthesias, poor appetite, weight loss, and general malaise.  All of those have improved significantly since her last visit.  At this point I think she can begin driving.  There are no restrictions on her activities although she was advised to start with very mild activity and gradually increase over time.  She is no longer having burning and tingling paresthesias and wants to cut back on the gabapentin.  She has been taking it 3 times a day.  She is going to taper that down to twice a day.  If she does okay with that she will cut back to once a day and then can stop it altogether.  Plan: Follow-up as scheduled with Dr. Julien Nordmann I will see her back after she has her CT done in 3 months.  [name], MD [facility] [phone]

## 2019-09-23 ENCOUNTER — Other Ambulatory Visit: Payer: Self-pay | Admitting: Internal Medicine

## 2019-10-30 ENCOUNTER — Ambulatory Visit (HOSPITAL_COMMUNITY)
Admission: RE | Admit: 2019-10-30 | Discharge: 2019-10-30 | Disposition: A | Discharge status: Home / Self Care | Payer: Medicare Other | Source: Ambulatory Visit | Attending: Internal Medicine | Admitting: Internal Medicine

## 2019-10-30 ENCOUNTER — Inpatient Hospital Stay: Payer: Medicare Other | Attending: Internal Medicine

## 2019-10-30 ENCOUNTER — Other Ambulatory Visit: Payer: Self-pay

## 2019-10-30 DIAGNOSIS — C3490 Malignant neoplasm of unspecified part of unspecified bronchus or lung: Secondary | ICD-10-CM | POA: Insufficient documentation

## 2019-10-30 DIAGNOSIS — E119 Type 2 diabetes mellitus without complications: Secondary | ICD-10-CM | POA: Insufficient documentation

## 2019-10-30 DIAGNOSIS — Z79899 Other long term (current) drug therapy: Secondary | ICD-10-CM | POA: Diagnosis not present

## 2019-10-30 DIAGNOSIS — Z923 Personal history of irradiation: Secondary | ICD-10-CM | POA: Insufficient documentation

## 2019-10-30 DIAGNOSIS — I10 Essential (primary) hypertension: Secondary | ICD-10-CM | POA: Diagnosis not present

## 2019-10-30 DIAGNOSIS — E039 Hypothyroidism, unspecified: Secondary | ICD-10-CM | POA: Insufficient documentation

## 2019-10-30 DIAGNOSIS — Z7984 Long term (current) use of oral hypoglycemic drugs: Secondary | ICD-10-CM | POA: Diagnosis not present

## 2019-10-30 DIAGNOSIS — G4730 Sleep apnea, unspecified: Secondary | ICD-10-CM | POA: Insufficient documentation

## 2019-10-30 DIAGNOSIS — N183 Chronic kidney disease, stage 3 unspecified: Secondary | ICD-10-CM | POA: Insufficient documentation

## 2019-10-30 DIAGNOSIS — F329 Major depressive disorder, single episode, unspecified: Secondary | ICD-10-CM | POA: Insufficient documentation

## 2019-10-30 DIAGNOSIS — C3411 Malignant neoplasm of upper lobe, right bronchus or lung: Secondary | ICD-10-CM | POA: Diagnosis present

## 2019-10-30 DIAGNOSIS — F419 Anxiety disorder, unspecified: Secondary | ICD-10-CM | POA: Diagnosis not present

## 2019-10-30 DIAGNOSIS — Z7982 Long term (current) use of aspirin: Secondary | ICD-10-CM | POA: Insufficient documentation

## 2019-10-30 DIAGNOSIS — Z9221 Personal history of antineoplastic chemotherapy: Secondary | ICD-10-CM | POA: Diagnosis not present

## 2019-10-30 DIAGNOSIS — Z8673 Personal history of transient ischemic attack (TIA), and cerebral infarction without residual deficits: Secondary | ICD-10-CM | POA: Insufficient documentation

## 2019-10-30 LAB — CBC WITH DIFFERENTIAL (CANCER CENTER ONLY)
Abs Immature Granulocytes: 0.04 10*3/uL (ref 0.00–0.07)
Basophils Absolute: 0 10*3/uL (ref 0.0–0.1)
Basophils Relative: 0 %
Eosinophils Absolute: 0.2 10*3/uL (ref 0.0–0.5)
Eosinophils Relative: 2 %
HCT: 37.5 % (ref 36.0–46.0)
Hemoglobin: 11.7 g/dL — ABNORMAL LOW (ref 12.0–15.0)
Immature Granulocytes: 0 %
Lymphocytes Relative: 16 %
Lymphs Abs: 1.4 10*3/uL (ref 0.7–4.0)
MCH: 28.5 pg (ref 26.0–34.0)
MCHC: 31.2 g/dL (ref 30.0–36.0)
MCV: 91.2 fL (ref 80.0–100.0)
Monocytes Absolute: 0.7 10*3/uL (ref 0.1–1.0)
Monocytes Relative: 8 %
Neutro Abs: 6.8 10*3/uL (ref 1.7–7.7)
Neutrophils Relative %: 74 %
Platelet Count: 282 10*3/uL (ref 150–400)
RBC: 4.11 MIL/uL (ref 3.87–5.11)
RDW: 15.2 % (ref 11.5–15.5)
WBC Count: 9.2 10*3/uL (ref 4.0–10.5)
nRBC: 0 % (ref 0.0–0.2)

## 2019-10-30 LAB — CMP (CANCER CENTER ONLY)
ALT: 17 U/L (ref 0–44)
AST: 15 U/L (ref 15–41)
Albumin: 3.4 g/dL — ABNORMAL LOW (ref 3.5–5.0)
Alkaline Phosphatase: 113 U/L (ref 38–126)
Anion gap: 11 (ref 5–15)
BUN: 7 mg/dL — ABNORMAL LOW (ref 8–23)
CO2: 29 mmol/L (ref 22–32)
Calcium: 9.7 mg/dL (ref 8.9–10.3)
Chloride: 104 mmol/L (ref 98–111)
Creatinine: 0.93 mg/dL (ref 0.44–1.00)
GFR, Est AFR Am: >60 mL/min (ref >60)
GFR, Estimated: >60 mL/min (ref >60)
Glucose, Bld: 124 mg/dL — ABNORMAL HIGH (ref 70–99)
Potassium: 3.8 mmol/L (ref 3.5–5.1)
Sodium: 144 mmol/L (ref 135–145)
Total Bilirubin: 0.3 mg/dL (ref 0.3–1.2)
Total Protein: 7 g/dL (ref 6.5–8.1)

## 2019-10-30 MED ORDER — SODIUM CHLORIDE (PF) 0.9 % IJ SOLN
INTRAMUSCULAR | Status: AC
  Filled 2019-10-30: qty 50

## 2019-10-30 MED ORDER — IOHEXOL 300 MG/ML  SOLN
75.0000 mL | Freq: Once | INTRAMUSCULAR | Status: AC | PRN
  Administered 2019-10-30: 75 mL via INTRAVENOUS

## 2019-11-01 ENCOUNTER — Inpatient Hospital Stay (HOSPITAL_BASED_OUTPATIENT_CLINIC_OR_DEPARTMENT_OTHER): Payer: Medicare Other | Admitting: Internal Medicine

## 2019-11-01 ENCOUNTER — Encounter: Payer: Self-pay | Admitting: Internal Medicine

## 2019-11-01 ENCOUNTER — Other Ambulatory Visit: Payer: Self-pay

## 2019-11-01 VITALS — BP 119/61 | HR 89 | Temp 98.5°F | Resp 18 | Ht 62.0 in | Wt 191.2 lb

## 2019-11-01 DIAGNOSIS — C3411 Malignant neoplasm of upper lobe, right bronchus or lung: Secondary | ICD-10-CM | POA: Diagnosis not present

## 2019-11-01 DIAGNOSIS — C3490 Malignant neoplasm of unspecified part of unspecified bronchus or lung: Secondary | ICD-10-CM

## 2019-11-01 DIAGNOSIS — Z902 Acquired absence of lung [part of]: Secondary | ICD-10-CM

## 2019-11-01 DIAGNOSIS — C3492 Malignant neoplasm of unspecified part of left bronchus or lung: Secondary | ICD-10-CM | POA: Diagnosis not present

## 2019-11-01 DIAGNOSIS — I10 Essential (primary) hypertension: Secondary | ICD-10-CM | POA: Diagnosis not present

## 2019-11-01 NOTE — Progress Notes (Signed)
[place] [phone]   [phone]  OFFICE PROGRESS NOTE  Tonya Art, MD [facility] [address] [place] [address]  DIAGNOSIS: stage IIb (T2b, N1, M0) non-small cell lung cancer, squamous cell carcinoma presented with right upper lobe lung mass in addition to left hilar adenopathy.  There was also a suspicious groundglass opacity bilaterally.  PRIOR THERAPY:  1) Concurrent chemoradiation with weekly carboplatin for AUC of 2 and paclitaxel 45 mg/M2.  Status post 10 cycles.  She received her radiation in [facility].  Last chemotherapy was given May 01, 2019. 2) status post left VATS and left pneumonectomy with lymph node sampling under the care of Dr. Roxan Hockey on 06/30/2019.  The final pathology was consistent with invasive pulmonary adenocarcinoma measuring 1.2 cm with a final stage of IA (ypT1b, ypN0).  CURRENT THERAPY: Observation.  INTERVAL HISTORY: Tonya Shelton 64 y.o. female returns to the clinic today for follow-up visit.  The patient is feeling fine today with no concerning complaints except for left-sided chest pain rated as 4 on a scale from 1-10.  She also has shortness of breath with exertion.  She denied having any cough or hemoptysis.  She denied having any fever or chills.  She has no nausea, vomiting, diarrhea or constipation.  She has no headache or visual changes.  The patient is here today for evaluation with repeat CT scan of the chest for restaging of her disease.   MEDICAL HISTORY: Past Medical History:  Diagnosis Date  . Acid reflux   . Anemia    low iron  . Anxiety   . Arthritis   . Chronic kidney disease    Stage 3 - Dr. Iona Beard in [place], [place]  . Depression   . Diabetes mellitus without complication ([facility])    type 2  . Dyspnea    on exertion  . Family history of adverse reaction to anesthesia    mom and sister have n/v  . H/O bladder problems    leaks  . History of hiatal  hernia   . Hypertension   . Hypothyroidism   . Neuropathy   . Osteoporosis   . PVC's (premature ventricular contractions)   . Restless legs   . Sleep apnea    uses cpap with oxygen  . Squamous cell carcinoma of bronchus in left upper lobe ([place])   . Stroke [facility]) 2007   TIA    ALLERGIES:  is allergic to erythromycin and povidone iodine.  MEDICATIONS:  Current Outpatient Medications  Medication Sig Dispense Refill  . acetaminophen (TYLENOL) 500 MG tablet Take 1,000-1,500 mg by mouth daily as needed for moderate pain.     Marland Kitchen alendronate (FOSAMAX) 70 MG tablet Take 70 mg by mouth every Sunday.     Marland Kitchen aspirin EC 81 MG tablet Take 81 mg by mouth daily at 12 noon.    Marland Kitchen atorvastatin (LIPITOR) 40 MG tablet Take 40 mg by mouth at bedtime.     . carvedilol (COREG) 25 MG tablet Take 25 mg by mouth 2 (two) times daily with a meal.    . cevimeline (EVOXAC) 30 MG capsule Take 30 mg by mouth 2 (two) times daily.     . DULoxetine (CYMBALTA) 60 MG capsule Take 60 mg by mouth daily.    Marland Kitchen gabapentin (NEURONTIN) 300 MG capsule Take 1 capsule (300 mg total) by mouth 2 (two) times daily. 60 capsule 2  . Homeopathic Products (RESTFUL LEGS SL)  Place 2 tablets under the tongue at bedtime.    Marland Kitchen levothyroxine (SYNTHROID, LEVOTHROID) 88 MCG tablet Take 88 mcg by mouth daily before breakfast.    . Magnesium 500 MG TABS Take 500 mg by mouth daily.    . Misc Natural Products (OSTEO BI-FLEX ADV TRIPLE ST PO) Take 1 tablet by mouth at bedtime.     . Multiple Vitamin (MULTIVITAMIN WITH MINERALS) TABS tablet Take 1 tablet by mouth daily.    . nitrofurantoin (MACRODANTIN) 100 MG capsule Take 100 mg by mouth 2 (two) times daily.    . nitroGLYCERIN (NITROSTAT) 0.4 MG SL tablet Place 0.4 mg under the tongue every 5 (five) minutes x 3 doses as needed for chest pain.     . Omega-3 Fatty Acids (FISH OIL) 1000 MG CAPS Take 2,000 mg by mouth 2 (two) times daily.    Marland Kitchen omeprazole (PRILOSEC) 20 MG capsule Take 40 mg by mouth daily.      Marland Kitchen tiZANidine (ZANAFLEX) 4 MG tablet Take 8 mg by mouth at bedtime.     . TRADJENTA 5 MG TABS tablet Take 5 mg by mouth daily.  0  . UNABLE TO FIND Med Name: CPAP with 2lpm o2 bled in  [place]    . Vitamin D, Ergocalciferol, (DRISDOL) 50000 units CAPS capsule Take 50,000 Units by mouth every Sunday.      No current facility-administered medications for this visit.    SURGICAL HISTORY:  Past Surgical History:  Procedure Laterality Date  . BUNIONECTOMY    . CHOLECYSTECTOMY    . GALLBLADDER SURGERY    . IR IMAGING GUIDED PORT INSERTION  03/07/2019  . LUMBAR LAMINECTOMY/DECOMPRESSION MICRODISCECTOMY N/A 10/15/2017   Procedure: LEFT L5-S1 MICRODISCECTOMY;  Surgeon: [name], MD;  Location: [place];  Service: Orthopedics;  Laterality: N/A;  . NEUROMA SURGERY    . SPINAL FUSION  2003   cervical  . tonsillectomy    . TONSILLECTOMY    . VIDEO ASSISTED THORACOSCOPY (VATS)/ LOBECTOMY Left 06/30/2019   Procedure: Left VIDEO ASSISTED THORACOSCOPY (VATS) / left Thoracotomy with Left Pneumonectomy and node Dissection;  Surgeon: [name], MD;  Location: [place];  Service: Thoracic;  Laterality: Left;  Marland Kitchen VIDEO BRONCHOSCOPY WITH RADIAL ENDOBRONCHIAL ULTRASOUND N/A 02/09/2019   Procedure: VIDEO BRONCHOSCOPY WITH RADIAL ENDOBRONCHIAL ULTRASOUND;  Surgeon: [name], MD;  Location: [place];  Service: Thoracic;  Laterality: N/A;    REVIEW OF SYSTEMS:  A comprehensive review of systems was negative except for: Respiratory: positive for dyspnea on exertion and pleurisy/chest pain   PHYSICAL EXAMINATION: General appearance: alert, cooperative and no distress Head: Normocephalic, without obvious abnormality, atraumatic Neck: no adenopathy, no JVD, supple, symmetrical, trachea midline and thyroid not enlarged, symmetric, no tenderness/mass/nodules Lymph nodes: Cervical, supraclavicular, and axillary nodes normal. Resp: diminished breath sounds LLL and LUL and dullness to percussion  LLL and LUL Back: symmetric, no curvature. ROM normal. No CVA tenderness. Cardio: regular rate and rhythm, S1, S2 normal, no murmur, click, rub or gallop GI: soft, non-tender; bowel sounds normal; no masses,  no organomegaly Extremities: extremities normal, atraumatic, no cyanosis or edema  ECOG PERFORMANCE STATUS: 1 - Symptomatic but completely ambulatory  Blood pressure 119/61, pulse 89, temperature 98.5 F (36.9 C), temperature source Oral, resp. rate 18, height 5\' 2"  (1.575 m), weight 191 lb 3.2 oz (86.7 kg), SpO2 95 %.  LABORATORY DATA: Lab Results  Component Value Date   WBC 9.2 10/30/2019   HGB 11.7 (L) 10/30/2019   HCT  37.5 10/30/2019   MCV 91.2 10/30/2019   PLT 282 10/30/2019      Chemistry      Component Value Date/Time   NA 144 10/30/2019 0844   K 3.8 10/30/2019 0844   CL 104 10/30/2019 0844   CO2 29 10/30/2019 0844   BUN 7 (L) 10/30/2019 0844   CREATININE 0.93 10/30/2019 0844   CREATININE 1.05 (H) 07/25/2019 1548      Component Value Date/Time   CALCIUM 9.7 10/30/2019 0844   ALKPHOS 113 10/30/2019 0844   AST 15 10/30/2019 0844   ALT 17 10/30/2019 0844   BILITOT 0.3 10/30/2019 0844       RADIOGRAPHIC STUDIES: CT Chest W Contrast  Result Date: 10/30/2019 CLINICAL DATA:  Follow-up lung cancer. EXAM: CT CHEST WITH CONTRAST TECHNIQUE: Multidetector CT imaging of the chest was performed during intravenous contrast administration. CONTRAST:  61mL OMNIPAQUE IOHEXOL 300 MG/ML  SOLN COMPARISON:  05/25/2019 FINDINGS: Cardiovascular: Normal heart size. Small pericardial effusion, increased from previous exam. Aortic atherosclerosis. Lad and RCA coronary artery calcifications. Mediastinum/Nodes: Shift of the mediastinum and its contents towards the left hemithorax is identified status post left pneumonectomy. Trachea appears patent. 1.1 cm right lobe of thyroid gland nodule. Normal appearance of the esophagus. No mediastinal or hilar adenopathy identified. No axillary or  supraclavicular adenopathy. Lungs/Pleura: Status post left pneumonectomy. No complications identified. There is diffuse pleural thickening within the left hemithorax. The lung cavity is completely filled with fluid now. Right lung hyperinflated. Within the right lung there are persistent scattered areas ground-glass nodularity, nonspecific. For example in the posterior right upper lobe sub solid nodule measures 0.9 cm, image 30/5. Unchanged. Lateral right upper lobe ground-glass nodule also measures 0.9 cm and is similar to previous exam, image 56/5. Subtle ground-glass nodule in the right lower lobe persist measuring 1 cm, image 82/5. Within the anteromedial right upper lobe there is a new patchy area subsegmental atelectasis and ground-glass attenuation likely related to nonspecific pneumonitis. Upper Abdomen: No acute abnormality. Musculoskeletal: Spondylosis within the thoracic spine. Multiple left rib deformities are likely postoperative. IMPRESSION: 1. Status post left pneumonectomy.  No complications. 2. Persistent, scattered ground-glass and solid nodules within the right lung are unchanged when compared with previous exam. Continued interval follow-up is recommended. 3. Aortic atherosclerosis. Coronary artery calcifications noted. 4. Small pericardial effusion, increased from previous exam. 5. Right lobe of thyroid gland nodule. No follow-up recommended unless clinically warranted (ref: J Am Coll Radiol. 2015 Feb;12(2): 143-50). Aortic Atherosclerosis (ICD10-I70.0). Electronically Signed   By: Kerby Moors M.D.   On: 10/30/2019 10:13    ASSESSMENT AND PLAN: This is a very pleasant 64 years old white female with currently unresectable stage IIB non-small cell lung cancer, squamous cell carcinoma. The patient was a started on a course of concurrent chemoradiation with weekly carboplatin and paclitaxel.  She tolerated this course of the treatment fairly well. Repeat imaging studies showed significant  improvement of her disease.  The patient was referred to Dr. Roxan Hockey and she underwent left pneumonectomy with lymph node sampling on June 30, 2019. The patient is recovering fairly well from her surgery.  The final pathology revealed only a small 1.2 cm residual tumor with no metastatic disease to the lymph node or resection margin. The patient is currently on observation and she is feeling fine today with no concerning complaints except for the shortness of breath with exertion.   She had repeat CT scan of the chest performed recently.  I personally and independently reviewed  the scans and discussed the results with the patient today. Her scan showed no concerning findings for disease recurrence but there was groundglass opacities in the right lung that need close observation on upcoming imaging studies.  The patient also has small pericardial effusion that also need close monitoring and further evaluation if she becomes more symptomatic. I will see her back for follow-up visit in 6 months with repeat CT scan of the chest for restaging of her disease. She was advised to call immediately if she has any concerning symptoms in the interval. The patient voices understanding of current disease status and treatment options and is in agreement with the current care plan.  All questions were answered. The patient knows to call the clinic with any problems, questions or concerns. We can certainly see the patient much sooner if necessary.   Disclaimer: This note was dictated with voice recognition software. Similar sounding words can inadvertently be transcribed and may not be corrected upon review.

## 2019-11-02 ENCOUNTER — Telehealth: Payer: Self-pay | Admitting: Internal Medicine

## 2019-11-02 NOTE — Telephone Encounter (Signed)
Scheduled per los. Called and left msg. Mailed printout  °

## 2019-11-21 ENCOUNTER — Ambulatory Visit (INDEPENDENT_AMBULATORY_CARE_PROVIDER_SITE_OTHER): Payer: Medicare Other | Admitting: Thoracic Surgery (Cardiothoracic Vascular Surgery)

## 2019-11-21 ENCOUNTER — Other Ambulatory Visit: Payer: Self-pay

## 2019-11-21 VITALS — BP 111/75 | HR 82 | Temp 97.6°F | Resp 20 | Ht 62.0 in | Wt 190.0 lb

## 2019-11-21 DIAGNOSIS — C3492 Malignant neoplasm of unspecified part of left bronchus or lung: Secondary | ICD-10-CM | POA: Diagnosis not present

## 2019-11-21 NOTE — Progress Notes (Signed)
[address]       [place] [address]             [phone]     HPI: Mrs. Boltz returns for scheduled follow-up  Vernia Teem is a 64 year old woman with a past history of tobacco abuse, hypertension, hypothyroidism, type 2 diabetes, obesity, stage III chronic kidney disease, neuropathy, reflux, and depression.  She was diagnosed with stage IIb squamous cell carcinoma of the left upper lobe in early 2020.  She was treated with neoadjuvant chemoradiation.  She had a partial response.  I did a left thoracotomy on 06/30/2019.  A pneumonectomy was necessary to get a complete resection.  On pathology there was a 1.5 cm adenocarcinoma but no residual squamous cell carcinoma.  She did well early on and went home on day 5.  She recently saw Dr. Julien Nordmann.  Her CT showed no worrisome findings.  He will see her again in 6 months.  She has shortness of breath with exertion.  Walking down the hallway from the front desk to the room because some shortness of breath.  She also complains of urinary incontinence.  She also complains of some pain around her scapula.  She is not taking any narcotics or any other medications for that.  Past Medical History:  Diagnosis Date  . Acid reflux   . Anemia    low iron  . Anxiety   . Arthritis   . Chronic kidney disease    Stage 3 - Dr. Iona Beard in [place], [place]  . Depression   . Diabetes mellitus without complication ([place])    type 2  . Dyspnea    on exertion  . Family history of adverse reaction to anesthesia    mom and sister have n/v  . H/O bladder problems    leaks  . History of hiatal hernia   . Hypertension   . Hypothyroidism   . Neuropathy   . Osteoporosis   . PVC's (premature ventricular contractions)   . Restless legs   . Sleep apnea    uses cpap with oxygen  . Squamous cell carcinoma of bronchus in left upper lobe ([place])   . Stroke [facility]) 2007   TIA    Current Outpatient Medications  Medication Sig Dispense  Refill  . acetaminophen (TYLENOL) 500 MG tablet Take 1,000-1,500 mg by mouth daily as needed for moderate pain.     Marland Kitchen alendronate (FOSAMAX) 70 MG tablet Take 70 mg by mouth every Sunday.     Marland Kitchen aspirin EC 81 MG tablet Take 81 mg by mouth daily at 12 noon.    Marland Kitchen atorvastatin (LIPITOR) 40 MG tablet Take 40 mg by mouth at bedtime.     . carvedilol (COREG) 25 MG tablet Take 25 mg by mouth 2 (two) times daily with a meal.    . cevimeline (EVOXAC) 30 MG capsule Take 30 mg by mouth 2 (two) times daily.     . DULoxetine (CYMBALTA) 60 MG capsule Take 60 mg by mouth daily.    Marland Kitchen gabapentin (NEURONTIN) 300 MG capsule Take 1 capsule (300 mg total) by mouth 2 (two) times daily. 60 capsule 2  . Homeopathic Products (RESTFUL LEGS SL) Place 2 tablets under the tongue at bedtime.    Marland Kitchen levothyroxine (SYNTHROID, LEVOTHROID) 88 MCG tablet Take 88 mcg by mouth daily before breakfast.    . Magnesium 500 MG TABS Take 500 mg by mouth daily.    . Misc Natural Products (  OSTEO BI-FLEX ADV TRIPLE ST PO) Take 1 tablet by mouth at bedtime.     . Multiple Vitamin (MULTIVITAMIN WITH MINERALS) TABS tablet Take 1 tablet by mouth daily.    . nitrofurantoin (MACRODANTIN) 100 MG capsule Take 100 mg by mouth 2 (two) times daily.    . nitroGLYCERIN (NITROSTAT) 0.4 MG SL tablet Place 0.4 mg under the tongue every 5 (five) minutes x 3 doses as needed for chest pain.     . Omega-3 Fatty Acids (FISH OIL) 1000 MG CAPS Take 2,000 mg by mouth 2 (two) times daily.    Marland Kitchen omeprazole (PRILOSEC) 20 MG capsule Take 40 mg by mouth daily.     Marland Kitchen tiZANidine (ZANAFLEX) 4 MG tablet Take 8 mg by mouth at bedtime.     . TRADJENTA 5 MG TABS tablet Take 5 mg by mouth daily.  0  . UNABLE TO FIND Med Name: CPAP with 2lpm o2 bled in  [place]    . Vitamin D, Ergocalciferol, (DRISDOL) 50000 units CAPS capsule Take 50,000 Units by mouth every Sunday.      No current facility-administered medications for this visit.    Physical Exam BP 111/75   Pulse 82    Temp 97.6 F (36.4 C) (Skin)   Resp 20   Ht 5\' 2"  (1.575 m)   Wt 190 lb (86.2 kg)   SpO2 96% Comment: RA  BMI 34.60 kg/m  64 year old woman in no acute distress Alert and oriented x3 with no focal deficits Right lung clear, no breath sounds on left Cardiac regular rate and rhythm normal S1 and S2 Trace edema bilaterally  Diagnostic Tests: CT CHEST WITH CONTRAST  TECHNIQUE: Multidetector CT imaging of the chest was performed during intravenous contrast administration.  CONTRAST:  74mL OMNIPAQUE IOHEXOL 300 MG/ML  SOLN  COMPARISON:  05/25/2019  FINDINGS: Cardiovascular: Normal heart size. Small pericardial effusion, increased from previous exam. Aortic atherosclerosis. Lad and RCA coronary artery calcifications.  Mediastinum/Nodes: Shift of the mediastinum and its contents towards the left hemithorax is identified status post left pneumonectomy. Trachea appears patent. 1.1 cm right lobe of thyroid gland nodule. Normal appearance of the esophagus. No mediastinal or hilar adenopathy identified. No axillary or supraclavicular adenopathy.  Lungs/Pleura: Status post left pneumonectomy. No complications identified. There is diffuse pleural thickening within the left hemithorax. The lung cavity is completely filled with fluid now. Right lung hyperinflated.  Within the right lung there are persistent scattered areas ground-glass nodularity, nonspecific. For example in the posterior right upper lobe sub solid nodule measures 0.9 cm, image 30/5. Unchanged. Lateral right upper lobe ground-glass nodule also measures 0.9 cm and is similar to previous exam, image 56/5. Subtle ground-glass nodule in the right lower lobe persist measuring 1 cm, image 82/5.  Within the anteromedial right upper lobe there is a new patchy area subsegmental atelectasis and ground-glass attenuation likely related to nonspecific pneumonitis.  Upper Abdomen: No acute  abnormality.  Musculoskeletal: Spondylosis within the thoracic spine. Multiple left rib deformities are likely postoperative.  IMPRESSION: 1. Status post left pneumonectomy.  No complications. 2. Persistent, scattered ground-glass and solid nodules within the right lung are unchanged when compared with previous exam. Continued interval follow-up is recommended. 3. Aortic atherosclerosis. Coronary artery calcifications noted. 4. Small pericardial effusion, increased from previous exam. 5. Right lobe of thyroid gland nodule. No follow-up recommended unless clinically warranted (ref: J Am Coll Radiol. 2015 Feb;12(2): 143-50).  Aortic Atherosclerosis (ICD10-I70.0).   Electronically Signed   By: Queen Slough.D.  On: 10/30/2019 10:13 I personally reviewed the CT images and concur with the findings noted above  Impression: Tonya Shelton is a 64 year old woman who has a history of tobacco abuse.  She was found to have a stage IIb squamous cell carcinoma.  She underwent neoadjuvant chemoradiation with a partial response.  She underwent resection but unfortunately lobectomy would not have resulted in a complete resection so a pneumonectomy was performed.  Interestingly there was no residual squamous cell carcinoma present but there was a 1.5 cm adenocarcinoma adjacent to the scar.  She now is 4 months out from surgery.  She has issues including shoulder and scapular pain, exertional dyspnea, and urinary incontinence.  Shoulder and scapular pain-that is due to retraction during surgery.  She is not taking any narcotics or any other medications for that matter.  She is on gabapentin but for other reasons.  I offered her stronger pain medication but she refused that at this time.  It is okay for her to use Tylenol or nonsteroidals as needed.  Exertional dyspnea-not surprising given that she required a pneumonectomy.  She had an FEV1 of about 1.6 preoperatively.  There has been a  dramatic acute decrease in her pulmonary reserve due to removal of the left lung.  I recommended that she try pulmonary rehab to start trying to increase her cardiopulmonary reserve.  We will arrange for that.  [place]  We will [place]  If not she can come here and only start the program and then continue on at home on her own.  Urinary incontinence-has been present since the surgery.  Not directly related to surgery but may have to do with catheterization and immobility.  Will refer to [facility]  Plan: Refer to [facility] regarding urinary incontinence Refer for pulmonary rehab Return in 6 months after CT chest  [name], MD [facility] [phone]

## 2019-11-22 ENCOUNTER — Other Ambulatory Visit: Payer: Self-pay

## 2019-11-22 DIAGNOSIS — Z902 Acquired absence of lung [part of]: Secondary | ICD-10-CM

## 2019-12-14 ENCOUNTER — Other Ambulatory Visit: Payer: Self-pay | Admitting: Thoracic Surgery (Cardiothoracic Vascular Surgery)

## 2020-04-25 ENCOUNTER — Telehealth: Payer: Self-pay | Admitting: Medical Oncology

## 2020-04-25 NOTE — Telephone Encounter (Signed)
Cannot schedule CT scan-"it is not authorized" Status says "pending ".

## 2020-04-26 ENCOUNTER — Ambulatory Visit (HOSPITAL_COMMUNITY): Payer: Medicare Other

## 2020-04-26 ENCOUNTER — Telehealth: Payer: Self-pay | Admitting: Internal Medicine

## 2020-04-26 ENCOUNTER — Inpatient Hospital Stay: Payer: Medicare Other | Attending: Internal Medicine

## 2020-04-26 ENCOUNTER — Other Ambulatory Visit: Payer: Self-pay

## 2020-04-26 DIAGNOSIS — N189 Chronic kidney disease, unspecified: Secondary | ICD-10-CM | POA: Insufficient documentation

## 2020-04-26 DIAGNOSIS — F419 Anxiety disorder, unspecified: Secondary | ICD-10-CM | POA: Diagnosis not present

## 2020-04-26 DIAGNOSIS — G2581 Restless legs syndrome: Secondary | ICD-10-CM | POA: Diagnosis not present

## 2020-04-26 DIAGNOSIS — Z9221 Personal history of antineoplastic chemotherapy: Secondary | ICD-10-CM | POA: Diagnosis not present

## 2020-04-26 DIAGNOSIS — G4730 Sleep apnea, unspecified: Secondary | ICD-10-CM | POA: Insufficient documentation

## 2020-04-26 DIAGNOSIS — F329 Major depressive disorder, single episode, unspecified: Secondary | ICD-10-CM | POA: Diagnosis not present

## 2020-04-26 DIAGNOSIS — Z79899 Other long term (current) drug therapy: Secondary | ICD-10-CM | POA: Insufficient documentation

## 2020-04-26 DIAGNOSIS — C3490 Malignant neoplasm of unspecified part of unspecified bronchus or lung: Secondary | ICD-10-CM

## 2020-04-26 DIAGNOSIS — C3411 Malignant neoplasm of upper lobe, right bronchus or lung: Secondary | ICD-10-CM | POA: Diagnosis not present

## 2020-04-26 DIAGNOSIS — Z8673 Personal history of transient ischemic attack (TIA), and cerebral infarction without residual deficits: Secondary | ICD-10-CM | POA: Insufficient documentation

## 2020-04-26 DIAGNOSIS — E119 Type 2 diabetes mellitus without complications: Secondary | ICD-10-CM | POA: Insufficient documentation

## 2020-04-26 DIAGNOSIS — K219 Gastro-esophageal reflux disease without esophagitis: Secondary | ICD-10-CM | POA: Diagnosis not present

## 2020-04-26 DIAGNOSIS — E039 Hypothyroidism, unspecified: Secondary | ICD-10-CM | POA: Diagnosis not present

## 2020-04-26 DIAGNOSIS — Z7982 Long term (current) use of aspirin: Secondary | ICD-10-CM | POA: Diagnosis not present

## 2020-04-26 DIAGNOSIS — I10 Essential (primary) hypertension: Secondary | ICD-10-CM | POA: Diagnosis not present

## 2020-04-26 LAB — CMP (CANCER CENTER ONLY)
ALT: 25 U/L (ref 0–44)
AST: 16 U/L (ref 15–41)
Albumin: 3.4 g/dL — ABNORMAL LOW (ref 3.5–5.0)
Alkaline Phosphatase: 115 U/L (ref 38–126)
Anion gap: 9 (ref 5–15)
BUN: 7 mg/dL — ABNORMAL LOW (ref 8–23)
CO2: 28 mmol/L (ref 22–32)
Calcium: 9.6 mg/dL (ref 8.9–10.3)
Chloride: 104 mmol/L (ref 98–111)
Creatinine: 0.95 mg/dL (ref 0.44–1.00)
GFR, Est AFR Am: >60 mL/min (ref >60)
GFR, Estimated: >60 mL/min (ref >60)
Glucose, Bld: 159 mg/dL — ABNORMAL HIGH (ref 70–99)
Potassium: 3.8 mmol/L (ref 3.5–5.1)
Sodium: 141 mmol/L (ref 135–145)
Total Bilirubin: 0.4 mg/dL (ref 0.3–1.2)
Total Protein: 6.9 g/dL (ref 6.5–8.1)

## 2020-04-26 LAB — CBC WITH DIFFERENTIAL (CANCER CENTER ONLY)
Abs Immature Granulocytes: 0.03 10*3/uL (ref 0.00–0.07)
Basophils Absolute: 0 10*3/uL (ref 0.0–0.1)
Basophils Relative: 0 %
Eosinophils Absolute: 0.1 10*3/uL (ref 0.0–0.5)
Eosinophils Relative: 1 %
HCT: 41.6 % (ref 36.0–46.0)
Hemoglobin: 13.6 g/dL (ref 12.0–15.0)
Immature Granulocytes: 0 %
Lymphocytes Relative: 17 %
Lymphs Abs: 1.6 10*3/uL (ref 0.7–4.0)
MCH: 30.7 pg (ref 26.0–34.0)
MCHC: 32.7 g/dL (ref 30.0–36.0)
MCV: 93.9 fL (ref 80.0–100.0)
Monocytes Absolute: 0.6 10*3/uL (ref 0.1–1.0)
Monocytes Relative: 6 %
Neutro Abs: 7.4 10*3/uL (ref 1.7–7.7)
Neutrophils Relative %: 76 %
Platelet Count: 240 10*3/uL (ref 150–400)
RBC: 4.43 MIL/uL (ref 3.87–5.11)
RDW: 12.9 % (ref 11.5–15.5)
WBC Count: 9.8 10*3/uL (ref 4.0–10.5)
nRBC: 0 % (ref 0.0–0.2)

## 2020-04-26 NOTE — Telephone Encounter (Signed)
R/s apt per 7/2 sch message - pt is aware of new appt date and time.

## 2020-04-30 ENCOUNTER — Inpatient Hospital Stay: Payer: Medicare Other | Admitting: Internal Medicine

## 2020-05-07 ENCOUNTER — Ambulatory Visit: Payer: Medicare Other | Admitting: Internal Medicine

## 2020-05-09 ENCOUNTER — Other Ambulatory Visit: Payer: Self-pay

## 2020-05-09 ENCOUNTER — Ambulatory Visit (HOSPITAL_COMMUNITY)
Admission: RE | Admit: 2020-05-09 | Discharge: 2020-05-09 | Disposition: A | Discharge status: Home / Self Care | Payer: Medicare Other | Source: Ambulatory Visit | Attending: Internal Medicine | Admitting: Internal Medicine

## 2020-05-09 DIAGNOSIS — C3490 Malignant neoplasm of unspecified part of unspecified bronchus or lung: Secondary | ICD-10-CM | POA: Diagnosis not present

## 2020-05-09 MED ORDER — SODIUM CHLORIDE (PF) 0.9 % IJ SOLN
INTRAMUSCULAR | Status: AC
  Filled 2020-05-09: qty 50

## 2020-05-09 MED ORDER — IOHEXOL 300 MG/ML  SOLN
75.0000 mL | Freq: Once | INTRAMUSCULAR | Status: AC | PRN
  Administered 2020-05-09: 75 mL via INTRAVENOUS

## 2020-05-14 ENCOUNTER — Ambulatory Visit (INDEPENDENT_AMBULATORY_CARE_PROVIDER_SITE_OTHER): Payer: BC Managed Care – PPO | Admitting: Thoracic Surgery (Cardiothoracic Vascular Surgery)

## 2020-05-14 ENCOUNTER — Encounter: Payer: Self-pay | Admitting: Internal Medicine

## 2020-05-14 ENCOUNTER — Other Ambulatory Visit: Payer: Self-pay

## 2020-05-14 ENCOUNTER — Encounter: Payer: Self-pay | Admitting: Thoracic Surgery (Cardiothoracic Vascular Surgery)

## 2020-05-14 ENCOUNTER — Inpatient Hospital Stay (HOSPITAL_BASED_OUTPATIENT_CLINIC_OR_DEPARTMENT_OTHER): Payer: Medicare Other | Admitting: Internal Medicine

## 2020-05-14 VITALS — BP 130/76 | HR 83 | Temp 97.5°F | Resp 20 | Ht 62.0 in | Wt 180.0 lb

## 2020-05-14 VITALS — BP 148/88 | HR 95 | Temp 97.8°F | Resp 18 | Ht 62.0 in | Wt 186.0 lb

## 2020-05-14 DIAGNOSIS — C3490 Malignant neoplasm of unspecified part of unspecified bronchus or lung: Secondary | ICD-10-CM

## 2020-05-14 DIAGNOSIS — C3492 Malignant neoplasm of unspecified part of left bronchus or lung: Secondary | ICD-10-CM

## 2020-05-14 DIAGNOSIS — C3411 Malignant neoplasm of upper lobe, right bronchus or lung: Secondary | ICD-10-CM | POA: Diagnosis not present

## 2020-05-14 NOTE — Progress Notes (Signed)
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  She had a 60-pack-year history of tobacco abuse prior to quitting in 2014.  She was diagnosed with squamous cell carcinoma of the left upper lobe in early 2020.  She was treated with neoadjuvant chemotherapy and radiation.  She then had a left pneumonectomy on 06/30/2019.  The original plan was a lobectomy but there was no way to get a complete resection without removing the entire lung.  There was no residual squamous cell carcinoma but there was a 1.5 cm adenocarcinoma in the specimen.  He has been on observation since then.  She continues to have shortness of breath with exertion.  She is not having any pain issues.  She has lost about 30pounds since starting treatment a year ago.  Her appetite is good and she is not had any recent weight loss.  No headaches or visual changes.  Past Medical History:  Diagnosis Date  . Acid reflux   . Anemia    low iron  . Anxiety   . Arthritis   . Chronic kidney disease    Stage 3 - Dr. Iona Beard in [place], [place]  . Depression   . Diabetes mellitus without complication ([place])    type 2  . Dyspnea    on exertion  . Family history of adverse reaction to anesthesia    mom and sister have n/v  . H/O bladder problems    leaks  . History of hiatal hernia   . Hypertension   . Hypothyroidism   . Neuropathy   . Osteoporosis   . PVC's (premature ventricular contractions)   . Restless legs   . Sleep apnea    uses cpap with oxygen  . Squamous cell carcinoma of bronchus in left upper lobe ([place])   . Stroke [facility]) 2007   TIA    Current Outpatient Medications    Medication Sig Dispense Refill  . acetaminophen (TYLENOL) 500 MG tablet Take 1,000-1,500 mg by mouth daily as needed for moderate pain.     Marland Kitchen alendronate (FOSAMAX) 70 MG tablet Take 70 mg by mouth every Sunday.     Marland Kitchen aspirin EC 81 MG tablet Take 81 mg by mouth daily at 12 noon.    Marland Kitchen atorvastatin (LIPITOR) 40 MG tablet Take 40 mg by mouth at bedtime.     . carvedilol (COREG) 25 MG tablet Take 25 mg by mouth 2 (two) times daily with a meal.    . cevimeline (EVOXAC) 30 MG capsule Take 30 mg by mouth 2 (two) times daily.     . DULoxetine (CYMBALTA) 60 MG capsule Take 60 mg by mouth daily.    . Homeopathic Products (RESTFUL LEGS SL) Place 2 tablets under the tongue at bedtime.    Marland Kitchen levothyroxine (SYNTHROID, LEVOTHROID) 88 MCG tablet Take 88 mcg by mouth daily before breakfast.    . Magnesium 500 MG TABS Take 500 mg by mouth daily.    . Misc Natural Products (OSTEO BI-FLEX ADV TRIPLE ST PO) Take 1 tablet by mouth at bedtime.     . Multiple  Vitamin (MULTIVITAMIN WITH MINERALS) TABS tablet Take 1 tablet by mouth daily.    . nitroGLYCERIN (NITROSTAT) 0.4 MG SL tablet Place 0.4 mg under the tongue every 5 (five) minutes x 3 doses as needed for chest pain.     . Omega-3 Fatty Acids (FISH OIL) 1000 MG CAPS Take 2,000 mg by mouth 2 (two) times daily.    Marland Kitchen omeprazole (PRILOSEC) 20 MG capsule Take 40 mg by mouth daily.     Marland Kitchen tiZANidine (ZANAFLEX) 4 MG tablet Take 8 mg by mouth at bedtime.     Marland Kitchen UNABLE TO FIND Med Name: CPAP with 2lpm o2 bled in  [place]    . Vitamin D, Ergocalciferol, (DRISDOL) 50000 units CAPS capsule Take 50,000 Units by mouth every Sunday.      No current facility-administered medications for this visit.    Physical Exam BP 130/76 (BP Location: Right Arm)   Pulse 83   Temp (!) 97.5 F (36.4 C) (Temporal)   Resp 20   Ht 5\' 2"  (1.575 m)   Wt 180 lb (81.6 kg)   SpO2 97% Comment: RA  BMI 32.6 kg/m  64 year old woman in no acute distress Alert and oriented x3 with no focal  deficits Lungs right clear, no breath sounds on left Cardiac regular rate and rhythm No cervical or supraclavicular adenopathy  Diagnostic Tests: CT CHEST WITH CONTRAST  TECHNIQUE: Multidetector CT imaging of the chest was performed during intravenous contrast administration.  CONTRAST:  62mL OMNIPAQUE IOHEXOL 300 MG/ML  SOLN  COMPARISON:  10/30/2019  FINDINGS: Cardiovascular: The heart is normal in size. Small to moderate left pericardial effusion, chronic. Leftward cardiomediastinal shift.  No evidence of thoracic aortic aneurysm. Mild atherosclerotic calcifications of the aortic arch.  Three vessel coronary atherosclerosis.  Right chest port terminates at the cavoatrial junction.  Mediastinum/Nodes: No suspicious mediastinal lymphadenopathy.  Visualized thyroid is unremarkable.  Lungs/Pleura: Status post left pneumonectomy. Associated chronic pleural thickening with volume loss and pleural fluid in the left hemithorax.  Faint subcentimeter ground-glass nodularity in the posterior right lung apex (series 7/image 25), unchanged. 1.8 cm ground-glass nodule in the central right lower lobe (series 7/image 78), more conspicuous than on the prior, possibly minimally progressive.  No suspicious solid nodules. Mild centrilobular emphysematous changes, upper lung predominant.  No pleural effusion or pneumothorax.  Upper Abdomen: Visualized upper abdomen is grossly unremarkable, noting vascular calcifications.  Musculoskeletal: Degenerative changes of the visualized thoracolumbar spine. Cervical spine fixation hardware, incompletely visualized.  IMPRESSION: Status post left pneumonectomy.  No evidence of recurrent or metastatic disease.  Faint ground-glass nodularity in the right lung, possibly minimally progressive in the right lower lobe. Attention on follow-up is suggested.  Aortic Atherosclerosis (ICD10-I70.0) and Emphysema  (ICD10-J43.9).   Electronically Signed   By: Julian Hy M.D.   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  Overall she is doing well.  She has no evidence of recurrent disease.  She does have some groundglass opacities in the right lung.  There is one in the lower lobe that might be minimally progressive.  I will think there is any indication for intervention at this time, but that will need to be followed radiographically.  She does have some chronic  dyspnea due to pneumonectomy.  Overall she is managing well.  Plan: Follow-up as scheduled with Dr. Julien Nordmann this afternoon I will be happy to see Tonya Shelton back anytime in the future if I can be of any further assistance with her care.  I spent 10 minutes in review of records, images, and in consultation with Mrs. Ohanesian today. [name], MD [facility] [phone]

## 2020-05-14 NOTE — Progress Notes (Signed)
[facility] Telephone:([phone]) [phone]   [phone]  OFFICE PROGRESS NOTE  Joseph Art, MD [facility] [address] [place] [address]  DIAGNOSIS: stage IIb (T2b, N1, M0) non-small cell lung cancer, squamous cell carcinoma presented with right upper lobe lung mass in addition to left hilar adenopathy.  There was also a suspicious groundglass opacity bilaterally.  PRIOR THERAPY:  1) Concurrent chemoradiation with weekly carboplatin for AUC of 2 and paclitaxel 45 mg/M2.  Status post 10 cycles.  She [facility] her [facility].  Last chemotherapy was given May 01, 2019. 2) status post left VATS and left pneumonectomy with lymph node sampling under the care of Dr. Roxan Hockey on 06/30/2019.  The final pathology was consistent with invasive pulmonary adenocarcinoma measuring 1.2 cm with a final stage of IA (ypT1b, ypN0).  CURRENT THERAPY: Observation.  INTERVAL HISTORY: Tonya Shelton 64 y.o. female returns to the clinic today for follow-up visit.  The patient is feeling fine today with no concerning complaints except for the baseline shortness of breath increased with exertion.  She denied having any chest pain, cough or hemoptysis.  She denied having any fever or chills.  She has no nausea, vomiting, diarrhea or constipation.  She denied having any headache or visual changes.  She had repeat CT scan of the chest performed recently and she is here for evaluation and discussion of her scan results.  MEDICAL HISTORY: Past Medical History:  Diagnosis Date   Acid reflux    Anemia    low iron   Anxiety    Arthritis    Chronic kidney disease    Stage 3 - Dr. Iona Beard in [place], [place]   Depression    Diabetes mellitus without complication ([place])    type 2   Dyspnea    on exertion   Family history of adverse reaction to anesthesia    mom and sister have n/v   H/O bladder problems    leaks   History of hiatal hernia     Hypertension    Hypothyroidism    Neuropathy    Osteoporosis    PVC's (premature ventricular contractions)    Restless legs    Sleep apnea    uses cpap with oxygen   Squamous cell carcinoma of bronchus in left upper lobe ([place])    Stroke ([place]) 2007   TIA    ALLERGIES:  is allergic to erythromycin and povidone iodine.  MEDICATIONS:  Current Outpatient Medications  Medication Sig Dispense Refill   acetaminophen (TYLENOL) 500 MG tablet Take 1,000-1,500 mg by mouth daily as needed for moderate pain.      alendronate (FOSAMAX) 70 MG tablet Take 70 mg by mouth every Sunday.      aspirin EC 81 MG tablet Take 81 mg by mouth daily at 12 noon.     atorvastatin (LIPITOR) 40 MG tablet Take 40 mg by mouth at bedtime.      carvedilol (COREG) 25 MG tablet Take 25 mg by mouth 2 (two) times daily with a meal.     cevimeline (EVOXAC) 30 MG capsule Take 30 mg by mouth 2 (two) times daily.      DULoxetine (CYMBALTA) 60 MG capsule Take 60 mg by mouth daily.     Homeopathic Products (RESTFUL LEGS SL) Place 2 tablets under the tongue at bedtime.     levothyroxine (SYNTHROID, LEVOTHROID) 88 MCG tablet Take 88 mcg by mouth daily before breakfast.  Magnesium 500 MG TABS Take 500 mg by mouth daily.     Misc Natural Products (OSTEO BI-FLEX ADV TRIPLE ST PO) Take 1 tablet by mouth at bedtime.      Multiple Vitamin (MULTIVITAMIN WITH MINERALS) TABS tablet Take 1 tablet by mouth daily.     nitroGLYCERIN (NITROSTAT) 0.4 MG SL tablet Place 0.4 mg under the tongue every 5 (five) minutes x 3 doses as needed for chest pain.      Omega-3 Fatty Acids (FISH OIL) 1000 MG CAPS Take 2,000 mg by mouth 2 (two) times daily.     omeprazole (PRILOSEC) 20 MG capsule Take 40 mg by mouth daily.      tiZANidine (ZANAFLEX) 4 MG tablet Take 8 mg by mouth at bedtime.      UNABLE TO FIND Med Name: CPAP with 2lpm o2 bled in  [facility]     Vitamin D, Ergocalciferol, (DRISDOL) 50000 units CAPS capsule Take  50,000 Units by mouth every Sunday.      No current facility-administered medications for this visit.    SURGICAL HISTORY:  Past Surgical History:  Procedure Laterality Date   BUNIONECTOMY     CHOLECYSTECTOMY     GALLBLADDER SURGERY     IR IMAGING GUIDED PORT INSERTION  03/07/2019   LUMBAR LAMINECTOMY/DECOMPRESSION MICRODISCECTOMY N/A 10/15/2017   Procedure: LEFT L5-S1 MICRODISCECTOMY;  Surgeon: [name], MD;  Location: [facility];  Service: Orthopedics;  Laterality: N/A;   NEUROMA SURGERY     SPINAL FUSION  2003   cervical   tonsillectomy     TONSILLECTOMY     VIDEO ASSISTED THORACOSCOPY (VATS)/ LOBECTOMY Left 06/30/2019   Procedure: Left VIDEO ASSISTED THORACOSCOPY (VATS) / left Thoracotomy with Left Pneumonectomy and node Dissection;  Surgeon: [name], MD;  Location: [facility];  Service: Thoracic;  Laterality: Left;   VIDEO BRONCHOSCOPY WITH RADIAL ENDOBRONCHIAL ULTRASOUND N/A 02/09/2019   Procedure: VIDEO BRONCHOSCOPY WITH RADIAL ENDOBRONCHIAL ULTRASOUND;  Surgeon: [name], MD;  Location: [facility];  Service: Thoracic;  Laterality: N/A;    REVIEW OF SYSTEMS:  A comprehensive review of systems was negative except for: Respiratory: positive for dyspnea on exertion   PHYSICAL EXAMINATION: General appearance: alert, cooperative, fatigued and no distress Head: Normocephalic, without obvious abnormality, atraumatic Neck: no adenopathy, no JVD, supple, symmetrical, trachea midline and thyroid not enlarged, symmetric, no tenderness/mass/nodules Lymph nodes: Cervical, supraclavicular, and axillary nodes normal. Resp: diminished breath sounds LLL and LUL and dullness to percussion LLL and LUL Back: symmetric, no curvature. ROM normal. No CVA tenderness. Cardio: regular rate and rhythm, S1, S2 normal, no murmur, click, rub or gallop GI: soft, non-tender; bowel sounds normal; no masses,  no organomegaly Extremities: extremities normal, atraumatic, no cyanosis or  edema  ECOG PERFORMANCE STATUS: 1 - Symptomatic but completely ambulatory  Blood pressure (!) 148/88, pulse 95, temperature 97.8 F (36.6 C), temperature source Temporal, resp. rate 18, height 5\' 2"  (1.575 m), weight 186 lb (84.4 kg), SpO2 96 %.  LABORATORY DATA: Lab Results  Component Value Date   WBC 9.8 04/26/2020   HGB 13.6 04/26/2020   HCT 41.6 04/26/2020   MCV 93.9 04/26/2020   PLT 240 04/26/2020      Chemistry      Component Value Date/Time   NA 141 04/26/2020 0924   K 3.8 04/26/2020 0924   CL 104 04/26/2020 0924   CO2 28 04/26/2020 0924   BUN 7 (L) 04/26/2020 0924   CREATININE 0.95 04/26/2020 0924   CREATININE 1.05 (  H) 07/25/2019 1548      Component Value Date/Time   CALCIUM 9.6 04/26/2020 0924   ALKPHOS 115 04/26/2020 0924   AST 16 04/26/2020 0924   ALT 25 04/26/2020 0924   BILITOT 0.4 04/26/2020 0924       RADIOGRAPHIC STUDIES: CT Chest W Contrast  Result Date: 05/10/2020 CLINICAL DATA:  Non-small cell lung cancer, for restaging. Status post left pneumonectomy. Status post chemo radiation. EXAM: CT CHEST WITH CONTRAST TECHNIQUE: Multidetector CT imaging of the chest was performed during intravenous contrast administration. CONTRAST:  39mL OMNIPAQUE IOHEXOL 300 MG/ML  SOLN COMPARISON:  10/30/2019 FINDINGS: Cardiovascular: The heart is normal in size. Small to moderate left pericardial effusion, chronic. Leftward cardiomediastinal shift. No evidence of thoracic aortic aneurysm. Mild atherosclerotic calcifications of the aortic arch. Three vessel coronary atherosclerosis. Right chest port terminates at the cavoatrial junction. Mediastinum/Nodes: No suspicious mediastinal lymphadenopathy. Visualized thyroid is unremarkable. Lungs/Pleura: Status post left pneumonectomy. Associated chronic pleural thickening with volume loss and pleural fluid in the left hemithorax. Faint subcentimeter ground-glass nodularity in the posterior right lung apex (series 7/image 25),  unchanged. 1.8 cm ground-glass nodule in the central right lower lobe (series 7/image 78), more conspicuous than on the prior, possibly minimally progressive. No suspicious solid nodules. Mild centrilobular emphysematous changes, upper lung predominant. No pleural effusion or pneumothorax. Upper Abdomen: Visualized upper abdomen is grossly unremarkable, noting vascular calcifications. Musculoskeletal: Degenerative changes of the visualized thoracolumbar spine. Cervical spine fixation hardware, incompletely visualized. IMPRESSION: Status post left pneumonectomy. No evidence of recurrent or metastatic disease. Faint ground-glass nodularity in the right lung, possibly minimally progressive in the right lower lobe. Attention on follow-up is suggested. Aortic Atherosclerosis (ICD10-I70.0) and Emphysema (ICD10-J43.9). Electronically Signed   By: Julian Hy M.D.   On: 05/10/2020 07:53    ASSESSMENT AND PLAN: This is a very pleasant 64 years old white female with currently unresectable stage IIB non-small cell lung cancer, squamous cell carcinoma. The patient was a started on a course of concurrent chemoradiation with weekly carboplatin and paclitaxel.  She tolerated this course of the treatment fairly well. Repeat imaging studies showed significant improvement of her disease.  The patient was referred to Dr. Roxan Hockey and she underwent left pneumonectomy with lymph node sampling on June 30, 2019. The patient is recovering fairly well from her surgery.  The final pathology revealed only a small 1.2 cm residual tumor with no metastatic disease to the lymph node or resection margin. The patient is currently on observation and she is feeling fine except for the shortness of breath with exertion. She had repeat CT scan of the chest that showed no concerning findings for disease recurrence or metastasis. I discussed the scan results with the patient today and recommended for her to continue on observation  with repeat CT scan of the chest in 6 months. For the shortness of breath, I gave the patient the option of referral to a pulmonologist in town but she would talk to her primary care physician first to see if he can make a referral to a local pulmonologist. She was advised to call immediately if she has any other concerning symptoms in the interval. The patient voices understanding of current disease status and treatment options and is in agreement with the current care plan.  All questions were answered. The patient knows to call the clinic with any problems, questions or concerns. We can certainly see the patient much sooner if necessary.   Disclaimer: This note was dictated with voice recognition  software. Similar sounding words can inadvertently be transcribed and may not be corrected upon review.

## 2020-05-16 ENCOUNTER — Telehealth: Payer: Self-pay | Admitting: Internal Medicine

## 2020-05-16 NOTE — Telephone Encounter (Signed)
Scheduled per los. Called and left msg. Mailed printout  °

## 2020-07-16 IMAGING — DX DG CHEST 1V PORT
1 series · 1 of 1 positions shown · non-contrast
Comparison: Single-view of the chest 07/02/2019 and 07/01/2019.

CLINICAL DATA: Patient status post left pneumonectomy 06/30/2019.

EXAM:
PORTABLE CHEST 1 VIEW

[chest ap]
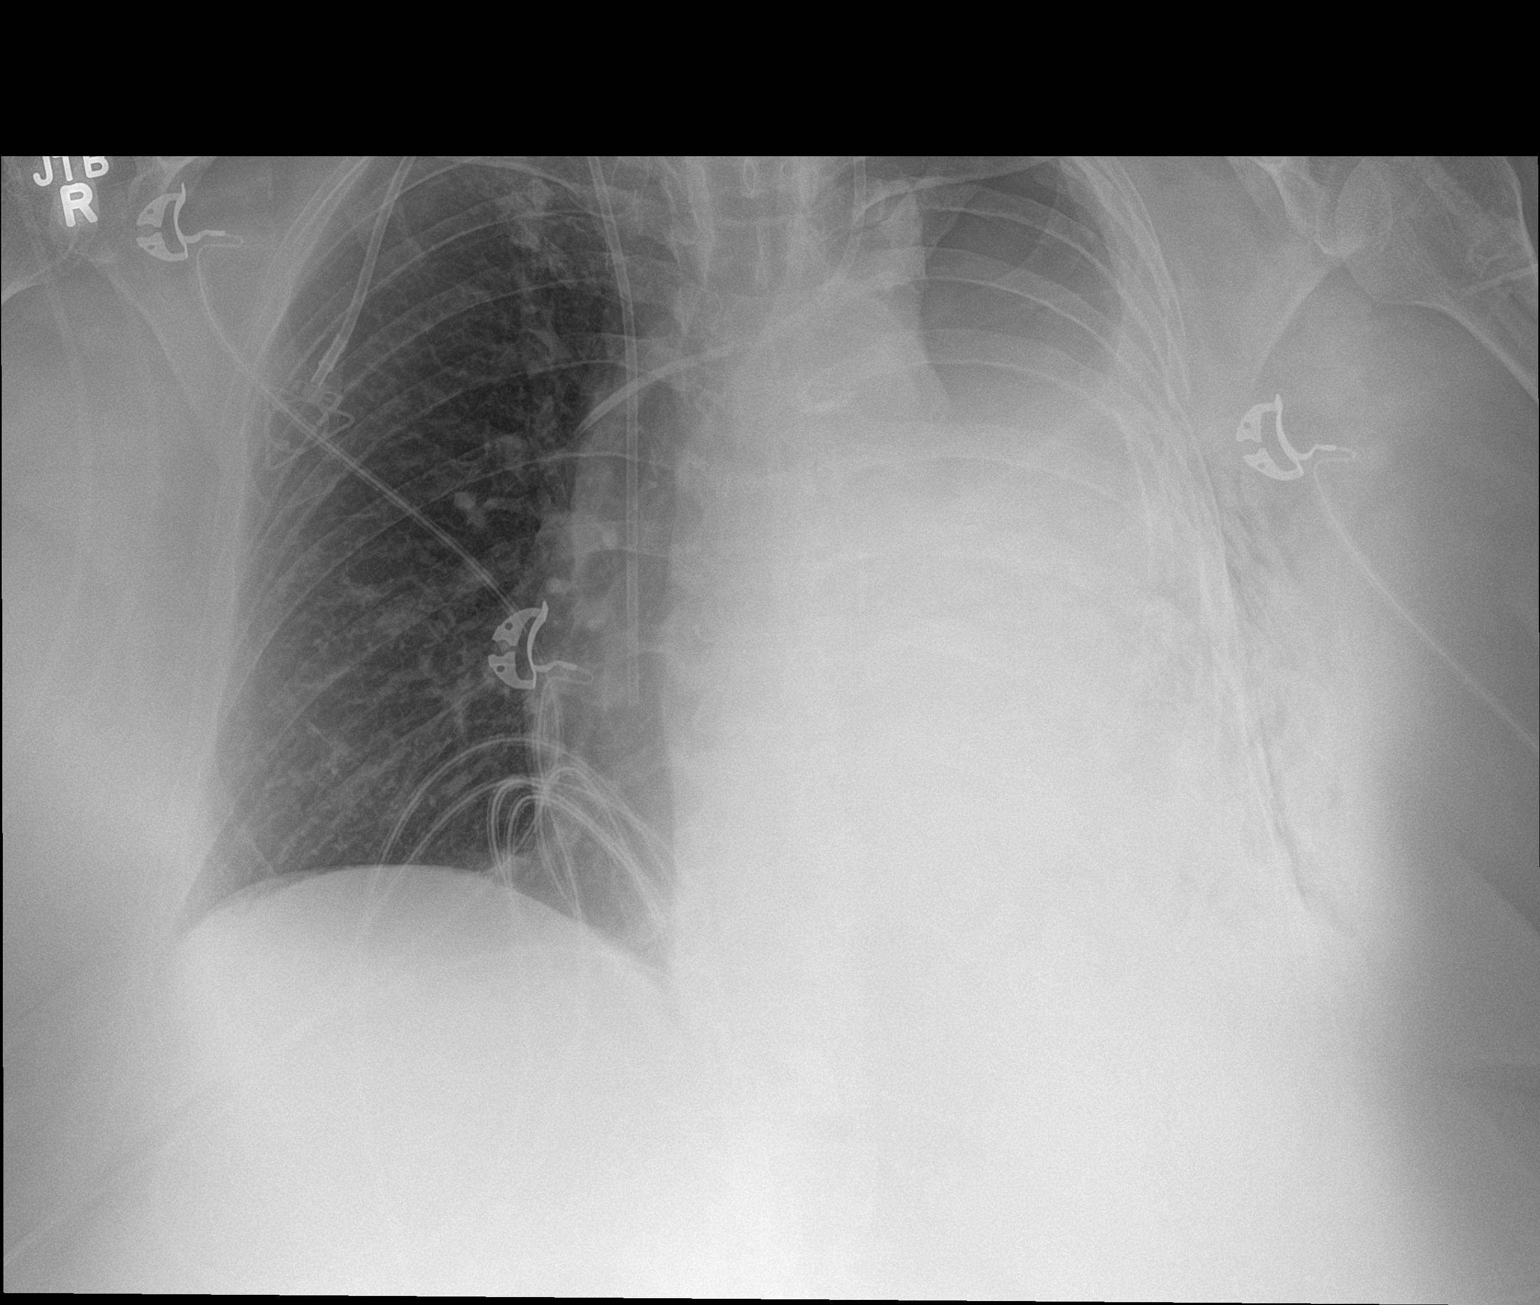

[1 of 1 positions shown; findings below may reference images not displayed]

FINDINGS: Near complete whiteout of the left chest consistent with pleural
effusion appears unchanged compared to yesterday's examination.
Subcutaneous emphysema along the left chest wall has slightly
increased. Right lung is expanded and clear. Cardiac silhouette is
obscured. Right Port-A-Cath and left IJ central venous catheter are
unchanged.
IMPRESSION: No change in near complete whiteout of the left chest in this
patient status post pneumonectomy.

Some increase in subcutaneous emphysema over the left chest.

## 2020-07-17 IMAGING — DX DG CHEST 1V PORT
1 series · 1 of 1 positions shown · non-contrast
Comparison: 07/03/2019

CLINICAL DATA: Shortness of breath, history of pneumonectomy

EXAM:
PORTABLE CHEST 1 VIEW

[chest ap]
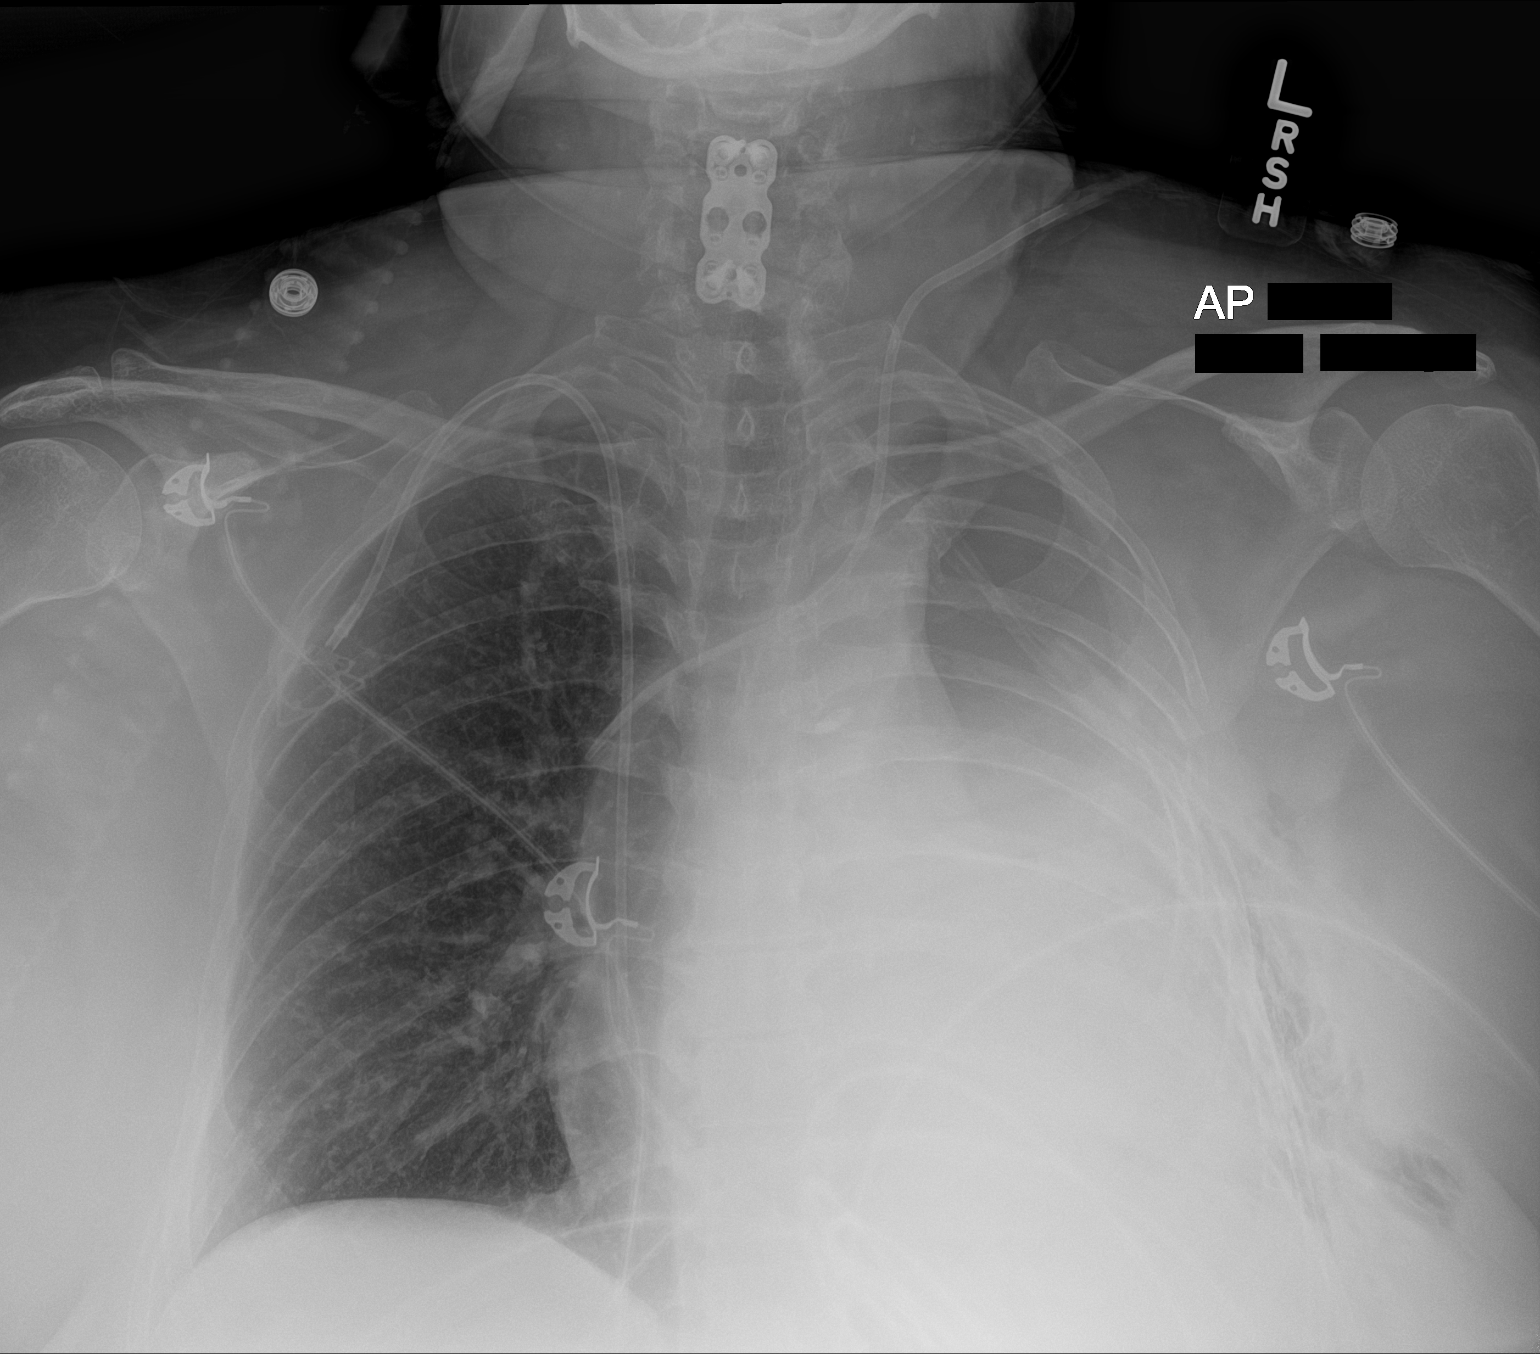

[1 of 1 positions shown; findings below may reference images not displayed]

FINDINGS: Complete whiteout of the left hemithorax consistent with history of
left pneumonectomy. Right lung is normally expanded. No right
pleural effusion or pneumothorax. Stable cardiomediastinal
silhouette. Left jugular central venous catheter with the tip
projecting over the SVC. Right-sided Port-A-Cath with the tip
projecting at the cavoatrial junction. Again noted are multiple rib
fractures along the left lateral chest wall likely postsurgical.
Soft tissue emphysema in the left lateral chest wall.
IMPRESSION: 1. Stable changes compatible with recent left pneumonectomy.

## 2020-07-18 IMAGING — DX DG CHEST 1V PORT
1 series · 1 of 1 positions shown · non-contrast
Comparison: Chest x-ray 07/04/2019.

CLINICAL DATA: 63-year-old female with history of pneumonectomy.
Follow-up study.

EXAM:
PORTABLE CHEST 1 VIEW

[chest ap]
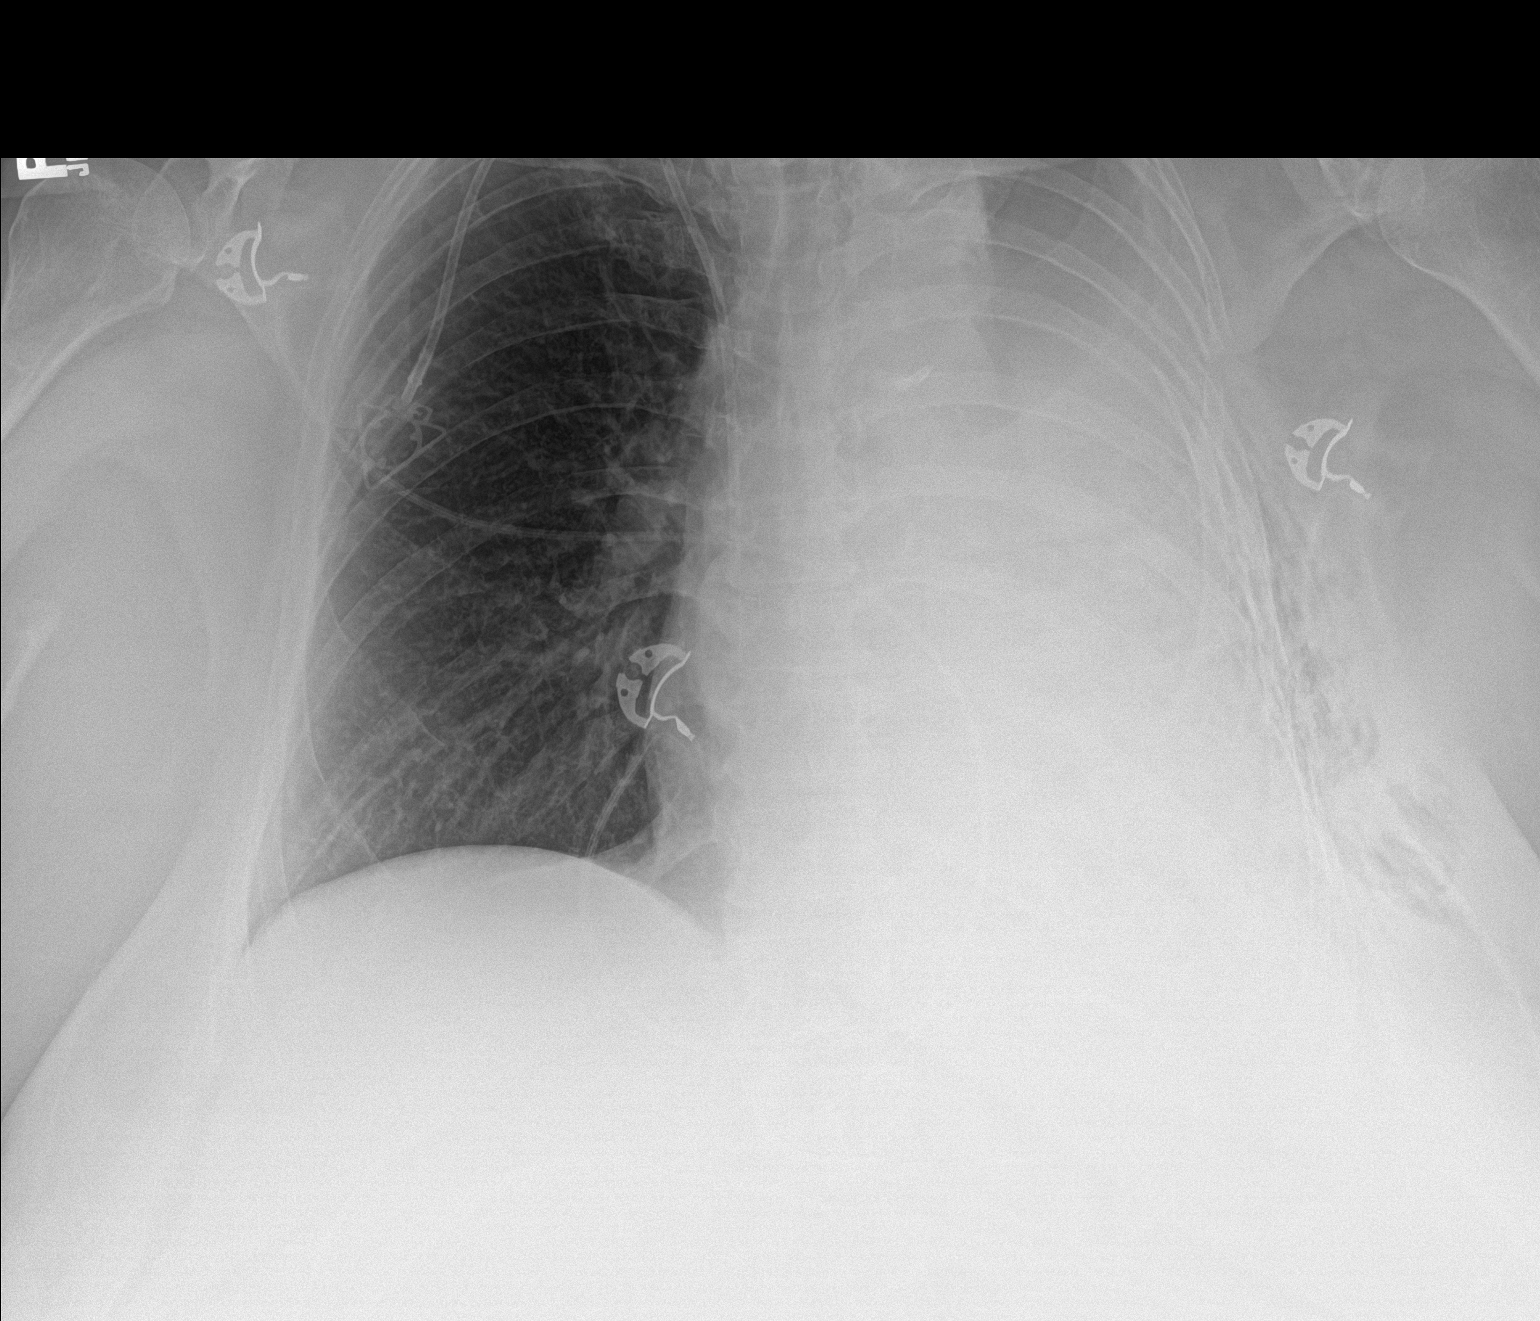

[1 of 1 positions shown; findings below may reference images not displayed]

FINDINGS: Previously noted left internal jugular central venous catheter has
been removed. Postoperative changes of left thoracotomy for left
pneumonectomy redemonstrated. Complete opacification of the left
hemithorax presumably reflective of fluid in the space. Right lung
is clear. No right pleural effusion. No evidence of pulmonary edema.
No suspicious right pulmonary nodule. Cardiomediastinal silhouette
is obscured by adjacent left-sided opacities. Right internal jugular
single-lumen power porta cath with tip terminating at the superior
cavoatrial junction.
IMPRESSION: 1. Postoperative changes and support apparatus, as above.
2. Otherwise, stable radiographic appearance of the chest, as above.

## 2020-08-28 IMAGING — CR DG CHEST 2V
2 series · 2 of 2 positions shown · non-contrast
Comparison: Chest x-ray dated 07/25/2019

CLINICAL DATA: Left pneumonectomy on 06/30/2019 for squamous cell
carcinoma of the left upper lobe.

EXAM:
CHEST - 2 VIEW

[w chest pa]
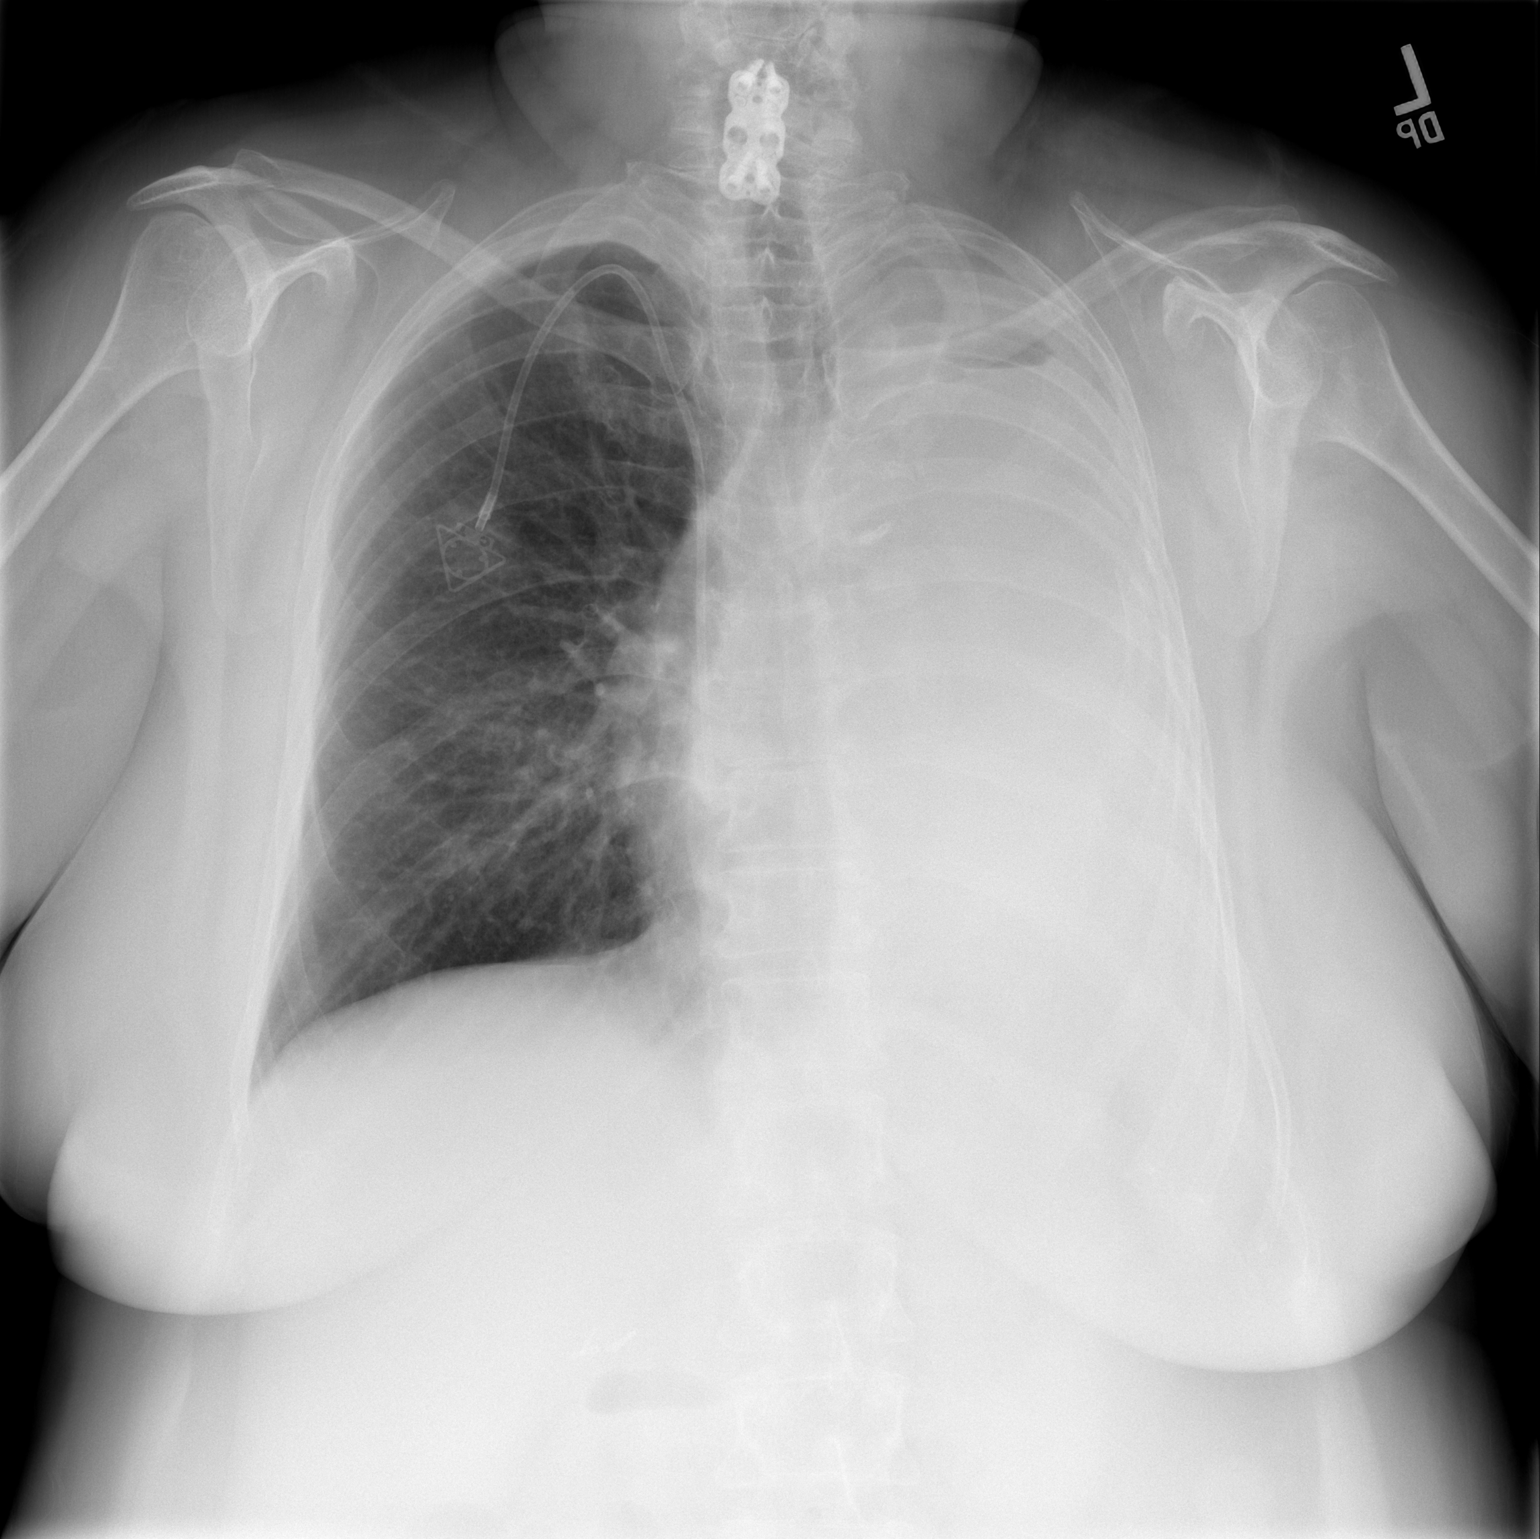

[w chest lat]
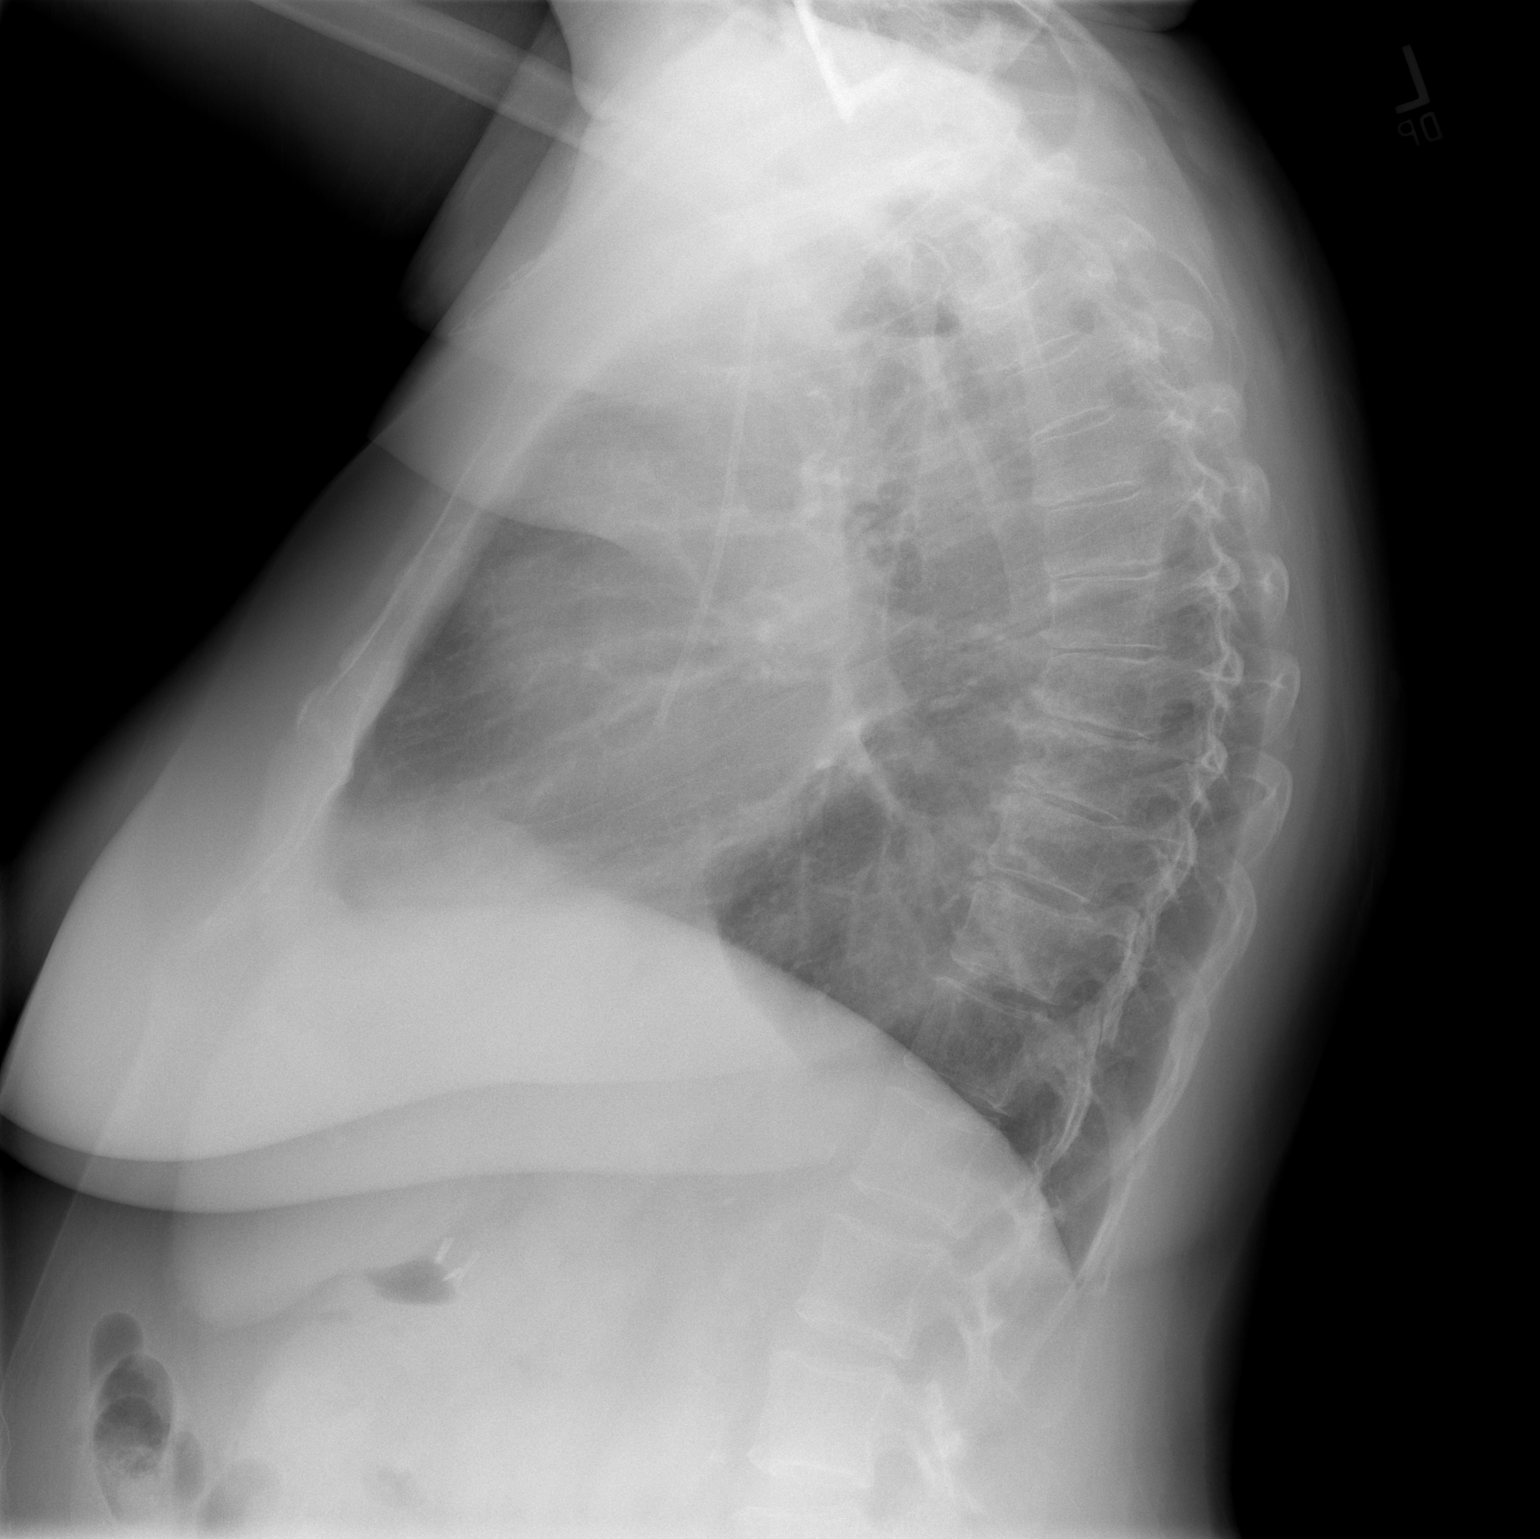

[2 of 2 positions shown; findings below may reference images not displayed]

FINDINGS: Power port appears in good position with the tip just above the
cavoatrial junction. Right lung is clear. No effusion.

Almost complete opacification of the left hemithorax after left
pneumonectomy. Tiny air collection at the left apex, diminished
since the prior study.

Pulmonary vascularity in the right lung is normal.

No acute bone abnormality.
IMPRESSION: 1. Almost complete opacification of the left hemithorax after left
pneumonectomy.
2. Tiny air collection at the left apex, diminished since the prior
study.
3. No acute abnormalities.

## 2020-11-12 ENCOUNTER — Other Ambulatory Visit: Payer: Self-pay

## 2020-11-12 ENCOUNTER — Inpatient Hospital Stay: Payer: Medicare Other | Attending: Internal Medicine

## 2020-11-12 ENCOUNTER — Ambulatory Visit (HOSPITAL_COMMUNITY): Payer: Medicare Other

## 2020-11-12 ENCOUNTER — Encounter (HOSPITAL_COMMUNITY): Payer: Self-pay

## 2020-11-12 DIAGNOSIS — Z85118 Personal history of other malignant neoplasm of bronchus and lung: Secondary | ICD-10-CM | POA: Insufficient documentation

## 2020-11-12 DIAGNOSIS — Z923 Personal history of irradiation: Secondary | ICD-10-CM | POA: Insufficient documentation

## 2020-11-12 DIAGNOSIS — Z9221 Personal history of antineoplastic chemotherapy: Secondary | ICD-10-CM | POA: Insufficient documentation

## 2020-11-12 DIAGNOSIS — E039 Hypothyroidism, unspecified: Secondary | ICD-10-CM | POA: Insufficient documentation

## 2020-11-12 DIAGNOSIS — G2581 Restless legs syndrome: Secondary | ICD-10-CM | POA: Diagnosis not present

## 2020-11-12 DIAGNOSIS — Z8673 Personal history of transient ischemic attack (TIA), and cerebral infarction without residual deficits: Secondary | ICD-10-CM | POA: Insufficient documentation

## 2020-11-12 DIAGNOSIS — Z902 Acquired absence of lung [part of]: Secondary | ICD-10-CM | POA: Diagnosis not present

## 2020-11-12 DIAGNOSIS — N189 Chronic kidney disease, unspecified: Secondary | ICD-10-CM | POA: Insufficient documentation

## 2020-11-12 DIAGNOSIS — C3490 Malignant neoplasm of unspecified part of unspecified bronchus or lung: Secondary | ICD-10-CM

## 2020-11-12 DIAGNOSIS — Z9049 Acquired absence of other specified parts of digestive tract: Secondary | ICD-10-CM | POA: Insufficient documentation

## 2020-11-12 DIAGNOSIS — E119 Type 2 diabetes mellitus without complications: Secondary | ICD-10-CM | POA: Insufficient documentation

## 2020-11-12 DIAGNOSIS — G4730 Sleep apnea, unspecified: Secondary | ICD-10-CM | POA: Diagnosis not present

## 2020-11-12 DIAGNOSIS — F418 Other specified anxiety disorders: Secondary | ICD-10-CM | POA: Diagnosis not present

## 2020-11-12 DIAGNOSIS — Z79899 Other long term (current) drug therapy: Secondary | ICD-10-CM | POA: Insufficient documentation

## 2020-11-12 DIAGNOSIS — Z7982 Long term (current) use of aspirin: Secondary | ICD-10-CM | POA: Diagnosis not present

## 2020-11-12 DIAGNOSIS — I10 Essential (primary) hypertension: Secondary | ICD-10-CM | POA: Insufficient documentation

## 2020-11-12 DIAGNOSIS — K219 Gastro-esophageal reflux disease without esophagitis: Secondary | ICD-10-CM | POA: Insufficient documentation

## 2020-11-12 LAB — CMP (CANCER CENTER ONLY)
ALT: 22 U/L (ref 0–44)
AST: 16 U/L (ref 15–41)
Albumin: 3.6 g/dL (ref 3.5–5.0)
Alkaline Phosphatase: 96 U/L (ref 38–126)
Anion gap: 8 (ref 5–15)
BUN: 19 mg/dL (ref 8–23)
CO2: 29 mmol/L (ref 22–32)
Calcium: 9.2 mg/dL (ref 8.9–10.3)
Chloride: 104 mmol/L (ref 98–111)
Creatinine: 0.92 mg/dL (ref 0.44–1.00)
GFR, Estimated: >60 mL/min (ref >60)
Glucose, Bld: 177 mg/dL — ABNORMAL HIGH (ref 70–99)
Potassium: 3.7 mmol/L (ref 3.5–5.1)
Sodium: 141 mmol/L (ref 135–145)
Total Bilirubin: 0.4 mg/dL (ref 0.3–1.2)
Total Protein: 7 g/dL (ref 6.5–8.1)

## 2020-11-12 LAB — CBC WITH DIFFERENTIAL (CANCER CENTER ONLY)
Abs Immature Granulocytes: 0.04 10*3/uL (ref 0.00–0.07)
Basophils Absolute: 0 10*3/uL (ref 0.0–0.1)
Basophils Relative: 0 %
Eosinophils Absolute: 0.1 10*3/uL (ref 0.0–0.5)
Eosinophils Relative: 1 %
HCT: 37.6 % (ref 36.0–46.0)
Hemoglobin: 12.6 g/dL (ref 12.0–15.0)
Immature Granulocytes: 0 %
Lymphocytes Relative: 17 %
Lymphs Abs: 1.6 10*3/uL (ref 0.7–4.0)
MCH: 31.7 pg (ref 26.0–34.0)
MCHC: 33.5 g/dL (ref 30.0–36.0)
MCV: 94.5 fL (ref 80.0–100.0)
Monocytes Absolute: 0.6 10*3/uL (ref 0.1–1.0)
Monocytes Relative: 6 %
Neutro Abs: 6.8 10*3/uL (ref 1.7–7.7)
Neutrophils Relative %: 76 %
Platelet Count: 198 10*3/uL (ref 150–400)
RBC: 3.98 MIL/uL (ref 3.87–5.11)
RDW: 12.5 % (ref 11.5–15.5)
WBC Count: 9.1 10*3/uL (ref 4.0–10.5)
nRBC: 0 % (ref 0.0–0.2)

## 2020-11-14 ENCOUNTER — Other Ambulatory Visit: Payer: Self-pay

## 2020-11-14 ENCOUNTER — Encounter: Payer: Self-pay | Admitting: Internal Medicine

## 2020-11-14 ENCOUNTER — Inpatient Hospital Stay (HOSPITAL_BASED_OUTPATIENT_CLINIC_OR_DEPARTMENT_OTHER): Payer: Medicare Other | Admitting: Internal Medicine

## 2020-11-14 ENCOUNTER — Encounter: Payer: Self-pay | Admitting: *Deleted

## 2020-11-14 DIAGNOSIS — C3492 Malignant neoplasm of unspecified part of left bronchus or lung: Secondary | ICD-10-CM | POA: Diagnosis not present

## 2020-11-14 DIAGNOSIS — Z85118 Personal history of other malignant neoplasm of bronchus and lung: Secondary | ICD-10-CM | POA: Diagnosis not present

## 2020-11-14 NOTE — Progress Notes (Signed)
[facility] [place] [phone]   [phone]  OFFICE PROGRESS NOTE  Joseph Art, MD [facility] [address] [place] [address]  DIAGNOSIS: stage IIb (T2b, N1, M0) non-small cell lung cancer, squamous cell carcinoma presented with right upper lobe lung mass in addition to left hilar adenopathy.  There was also a suspicious groundglass opacity bilaterally.  PRIOR THERAPY:  1) Concurrent chemoradiation with weekly carboplatin for AUC of 2 and paclitaxel 45 mg/M2.  Status post 10 cycles.  She received her radiation in [facility].  Last chemotherapy was given May 01, 2019. 2) status post left VATS and left pneumonectomy with lymph node sampling under the care of Dr. Roxan Hockey on 06/30/2019.  The final pathology was consistent with invasive pulmonary adenocarcinoma measuring 1.2 cm with a final stage of IA (ypT1b, ypN0).  CURRENT THERAPY: Observation.  INTERVAL HISTORY: Tonya Shelton 65 y.o. female returns to the clinic today for follow-up visit.  The patient is feeling fine today with no concerning complaints except for the baseline shortness of breath, increased with exertion.  She denied having any chest pain, cough or hemoptysis.  She denied having any nausea, vomiting, diarrhea or constipation.  She denied having any headache or visual changes.  Unfortunately her son was recently diagnosed with colon cancer and her daughter also has a history of breast cancer.  The patient was supposed to have repeat CT scan of the chest before this visit but with the change of her insurance, her new insurance did not approve the scan to be done in [place].  The patient is here today for evaluation and repeat blood work and also recommendation about her future care.  MEDICAL HISTORY: Past Medical History:  Diagnosis Date  . Acid reflux   . Anemia    low iron  . Anxiety   . Arthritis   . Chronic kidney disease    Stage 3 - Dr. Iona Beard in  [place], [place]  . Depression   . Diabetes mellitus without complication ([place])    type 2  . Dyspnea    on exertion  . Family history of adverse reaction to anesthesia    mom and sister have n/v  . H/O bladder problems    leaks  . History of hiatal hernia   . Hypertension   . Hypothyroidism   . Neuropathy   . Osteoporosis   . PVC's (premature ventricular contractions)   . Restless legs   . Sleep apnea    uses cpap with oxygen  . Squamous cell carcinoma of bronchus in left upper lobe ([place])   . Stroke [facility]) 2007   TIA    ALLERGIES:  is allergic to erythromycin and povidone iodine.  MEDICATIONS:  Current Outpatient Medications  Medication Sig Dispense Refill  . acetaminophen (TYLENOL) 500 MG tablet Take 1,000-1,500 mg by mouth daily as needed for moderate pain.     Marland Kitchen alendronate (FOSAMAX) 70 MG tablet Take 70 mg by mouth every Sunday.     Marland Kitchen aspirin EC 81 MG tablet Take 81 mg by mouth daily at 12 noon.    Marland Kitchen atorvastatin (LIPITOR) 40 MG tablet Take 40 mg by mouth at bedtime.     . carvedilol (COREG) 25 MG tablet Take 25 mg by mouth 2 (two) times daily with a meal.    . cevimeline (EVOXAC) 30 MG capsule Take 30 mg by mouth 2 (two) times daily.     . DULoxetine (CYMBALTA) 60  MG capsule Take 60 mg by mouth daily.    . Homeopathic Products (RESTFUL LEGS SL) Place 2 tablets under the tongue at bedtime.    Marland Kitchen levothyroxine (SYNTHROID, LEVOTHROID) 88 MCG tablet Take 88 mcg by mouth daily before breakfast.    . Magnesium 500 MG TABS Take 500 mg by mouth daily.    . Misc Natural Products (OSTEO BI-FLEX ADV TRIPLE ST PO) Take 1 tablet by mouth at bedtime.     . Multiple Vitamin (MULTIVITAMIN WITH MINERALS) TABS tablet Take 1 tablet by mouth daily.    . nitroGLYCERIN (NITROSTAT) 0.4 MG SL tablet Place 0.4 mg under the tongue every 5 (five) minutes x 3 doses as needed for chest pain.  (Patient not taking: Reported on 05/14/2020)    . Omega-3 Fatty Acids (FISH OIL) 1000 MG CAPS Take 2,000 mg by  mouth 2 (two) times daily.    Marland Kitchen omeprazole (PRILOSEC) 20 MG capsule Take 40 mg by mouth daily.     Marland Kitchen tiZANidine (ZANAFLEX) 4 MG tablet Take 8 mg by mouth at bedtime.     Marland Kitchen UNABLE TO FIND Med Name: CPAP with 2lpm o2 bled in  [place]    . Vitamin D, Ergocalciferol, (DRISDOL) 50000 units CAPS capsule Take 50,000 Units by mouth every Sunday.      No current facility-administered medications for this visit.    SURGICAL HISTORY:  Past Surgical History:  Procedure Laterality Date  . BUNIONECTOMY    . CHOLECYSTECTOMY    . GALLBLADDER SURGERY    . IR IMAGING GUIDED PORT INSERTION  03/07/2019  . LUMBAR LAMINECTOMY/DECOMPRESSION MICRODISCECTOMY N/A 10/15/2017   Procedure: LEFT L5-S1 MICRODISCECTOMY;  Surgeon: [name], MD;  Location: [facility];  Service: Orthopedics;  Laterality: N/A;  . NEUROMA SURGERY    . SPINAL FUSION  2003   cervical  . tonsillectomy    . TONSILLECTOMY    . VIDEO ASSISTED THORACOSCOPY (VATS)/ LOBECTOMY Left 06/30/2019   Procedure: Left VIDEO ASSISTED THORACOSCOPY (VATS) / left Thoracotomy with Left Pneumonectomy and node Dissection;  Surgeon: [name], MD;  Location: [facility];  Service: Thoracic;  Laterality: Left;  Marland Kitchen VIDEO BRONCHOSCOPY WITH RADIAL ENDOBRONCHIAL ULTRASOUND N/A 02/09/2019   Procedure: VIDEO BRONCHOSCOPY WITH RADIAL ENDOBRONCHIAL ULTRASOUND;  Surgeon: [name], MD;  Location: [facility];  Service: Thoracic;  Laterality: N/A;    REVIEW OF SYSTEMS:  A comprehensive review of systems was negative except for: Respiratory: positive for dyspnea on exertion   PHYSICAL EXAMINATION: General appearance: alert, cooperative, fatigued and no distress Head: Normocephalic, without obvious abnormality, atraumatic Neck: no adenopathy, no JVD, supple, symmetrical, trachea midline and thyroid not enlarged, symmetric, no tenderness/mass/nodules Lymph nodes: Cervical, supraclavicular, and axillary nodes normal. Resp: diminished breath sounds LLL and LUL and  dullness to percussion LLL and LUL Back: symmetric, no curvature. ROM normal. No CVA tenderness. Cardio: regular rate and rhythm, S1, S2 normal, no murmur, click, rub or gallop GI: soft, non-tender; bowel sounds normal; no masses,  no organomegaly Extremities: extremities normal, atraumatic, no cyanosis or edema  ECOG PERFORMANCE STATUS: 1 - Symptomatic but completely ambulatory  There were no vitals taken for this visit.  LABORATORY DATA: Lab Results  Component Value Date   WBC 9.1 11/12/2020   HGB 12.6 11/12/2020   HCT 37.6 11/12/2020   MCV 94.5 11/12/2020   PLT 198 11/12/2020      Chemistry      Component Value Date/Time   NA 141 11/12/2020 1054   K 3.7  11/12/2020 1054   CL 104 11/12/2020 1054   CO2 29 11/12/2020 1054   BUN 19 11/12/2020 1054   CREATININE 0.92 11/12/2020 1054   CREATININE 1.05 (H) 07/25/2019 1548      Component Value Date/Time   CALCIUM 9.2 11/12/2020 1054   ALKPHOS 96 11/12/2020 1054   AST 16 11/12/2020 1054   ALT 22 11/12/2020 1054   BILITOT 0.4 11/12/2020 1054       RADIOGRAPHIC STUDIES: No results found.  ASSESSMENT AND PLAN: This is a very pleasant 65 years old white female with currently unresectable stage IIB non-small cell lung cancer, squamous cell carcinoma. The patient was a started on a course of concurrent chemoradiation with weekly carboplatin and paclitaxel.  She tolerated this course of the treatment fairly well. Repeat imaging studies showed significant improvement of her disease.  The patient was referred to Dr. Roxan Hockey and she underwent left pneumonectomy with lymph node sampling on June 30, 2019. The patient is recovering fairly well from her surgery.  The final pathology revealed only a small 1.2 cm residual tumor with no metastatic disease to the lymph node or resection margin. The patient has been on observation since that time and she is feeling fine today with no concerning complaints. She was supposed to have  repeat CT scan of the chest for restaging of her disease but unfortunately with the change in her insurance they did not approve her scan to be done in [place]. The patient would like to move her care to [place] because of the recent changes in her insurance.  She requested referral to Dr. Mirian Capuchin with [facility] in [place]. I will make the referral for the patient to see Dr. Celesta Aver. Because of the several family members diagnosed with malignancy recently, I recommended for her to ask Dr. Celesta Aver for genetic counseling referral. The patient was advised to call if she has any concerning symptoms in the interval before seeing Dr. Celesta Aver. The patient voices understanding of current disease status and treatment options and is in agreement with the current care plan.  All questions were answered. The patient knows to call the clinic with any problems, questions or concerns. We can certainly see the patient much sooner if necessary.   Disclaimer: This note was dictated with voice recognition software. Similar sounding words can inadvertently be transcribed and may not be corrected upon review.

## 2020-11-14 NOTE — Progress Notes (Signed)
I spoke with Tonya Shelton today.  She is doing well.  Due to her insurance, she will be transferring her care to [facility] in [place].  I called them and updated on referral.  I faxed records they requested to them at [phone] with confirmation fax back.  She was thankful for her care here with [place] and Dr. Julien Nordmann asked her to call if she needed anything.

## 2020-11-19 ENCOUNTER — Telehealth: Payer: Self-pay | Admitting: Internal Medicine

## 2020-11-19 NOTE — Telephone Encounter (Signed)
Per 1/20 los, no changes made to pt schedule

## 2020-11-20 ENCOUNTER — Telehealth: Payer: Self-pay | Admitting: Medical Oncology

## 2020-11-20 NOTE — Telephone Encounter (Signed)
PET /CT requested on disc to go to [facility] -Dr Stephanie Coup. [place] Nail ex number [phone]. Pt appt 2/2. Sent information to [place] ( [place]) via secure chat.

## 2020-11-28 ENCOUNTER — Telehealth: Payer: Self-pay | Admitting: Internal Medicine

## 2020-11-28 NOTE — Telephone Encounter (Signed)
Release #[id number] Released records to [facility] to [phone]

## 2021-10-08 ENCOUNTER — Encounter: Payer: Self-pay | Admitting: Internal Medicine

## 2022-06-05 ENCOUNTER — Encounter: Payer: Self-pay | Admitting: Internal Medicine

## 2023-09-15 ENCOUNTER — Encounter: Payer: Self-pay | Admitting: Internal Medicine

## 2023-09-15 NOTE — Telephone Encounter (Signed)
Telephone call
